# Patient Record
Sex: Male | Born: 1950
Health system: Southern US, Community
[De-identification: ages and names within clinical notes are randomized; demographics above are authoritative.]

## PROBLEM LIST (undated history)

## (undated) DIAGNOSIS — A499 Bacterial infection, unspecified: Secondary | ICD-10-CM

## (undated) DIAGNOSIS — N390 Urinary tract infection, site not specified: Secondary | ICD-10-CM

## (undated) DIAGNOSIS — R231 Pallor: Secondary | ICD-10-CM

## (undated) DIAGNOSIS — J309 Allergic rhinitis, unspecified: Secondary | ICD-10-CM

## (undated) DIAGNOSIS — E119 Type 2 diabetes mellitus without complications: Secondary | ICD-10-CM

## (undated) DIAGNOSIS — J449 Chronic obstructive pulmonary disease, unspecified: Secondary | ICD-10-CM

## (undated) DIAGNOSIS — Z87442 Personal history of urinary calculi: Secondary | ICD-10-CM

## (undated) DIAGNOSIS — I872 Venous insufficiency (chronic) (peripheral): Secondary | ICD-10-CM

## (undated) DIAGNOSIS — J9811 Atelectasis: Secondary | ICD-10-CM

## (undated) DIAGNOSIS — R0600 Dyspnea, unspecified: Secondary | ICD-10-CM

## (undated) DIAGNOSIS — N200 Calculus of kidney: Secondary | ICD-10-CM

## (undated) DIAGNOSIS — R748 Abnormal levels of other serum enzymes: Secondary | ICD-10-CM

## (undated) DIAGNOSIS — R0602 Shortness of breath: Secondary | ICD-10-CM

## (undated) DIAGNOSIS — D759 Disease of blood and blood-forming organs, unspecified: Secondary | ICD-10-CM

## (undated) DIAGNOSIS — R05 Cough: Secondary | ICD-10-CM

## (undated) DIAGNOSIS — R160 Hepatomegaly, not elsewhere classified: Secondary | ICD-10-CM

## (undated) DIAGNOSIS — I809 Phlebitis and thrombophlebitis of unspecified site: Secondary | ICD-10-CM

## (undated) DIAGNOSIS — R059 Cough, unspecified: Secondary | ICD-10-CM

## (undated) DIAGNOSIS — I10 Essential (primary) hypertension: Secondary | ICD-10-CM

## (undated) DIAGNOSIS — R768 Other specified abnormal immunological findings in serum: Secondary | ICD-10-CM

## (undated) DIAGNOSIS — E669 Obesity, unspecified: Secondary | ICD-10-CM

## (undated) DIAGNOSIS — G709 Myoneural disorder, unspecified: Secondary | ICD-10-CM

## (undated) DIAGNOSIS — M79606 Pain in leg, unspecified: Secondary | ICD-10-CM

## (undated) DIAGNOSIS — G4730 Sleep apnea, unspecified: Secondary | ICD-10-CM

## (undated) DIAGNOSIS — R350 Frequency of micturition: Secondary | ICD-10-CM

## (undated) DIAGNOSIS — R001 Bradycardia, unspecified: Secondary | ICD-10-CM

## (undated) DIAGNOSIS — R609 Edema, unspecified: Secondary | ICD-10-CM

## (undated) DIAGNOSIS — I878 Other specified disorders of veins: Secondary | ICD-10-CM

## (undated) DIAGNOSIS — J189 Pneumonia, unspecified organism: Secondary | ICD-10-CM

## (undated) DIAGNOSIS — M069 Rheumatoid arthritis, unspecified: Secondary | ICD-10-CM

## (undated) DIAGNOSIS — Z8489 Family history of other specified conditions: Secondary | ICD-10-CM

## (undated) HISTORY — DX: Allergic rhinitis, unspecified: J30.9

## (undated) HISTORY — DX: Abnormal levels of other serum enzymes: R74.8

## (undated) HISTORY — DX: Bradycardia, unspecified: R00.1

## (undated) HISTORY — DX: Venous insufficiency (chronic) (peripheral): I87.2

## (undated) HISTORY — DX: Shortness of breath: R06.02

## (undated) HISTORY — PX: KNEE SURGERY: SHX244

## (undated) HISTORY — DX: Edema, unspecified: R60.9

## (undated) HISTORY — DX: Essential (primary) hypertension: I10

## (undated) HISTORY — DX: Phlebitis and thrombophlebitis of unspecified site: I80.9

## (undated) HISTORY — PX: SPINAL CORD STIMULATOR IMPLANT: SHX2422

## (undated) HISTORY — DX: Rheumatoid arthritis, unspecified: M06.9

## (undated) HISTORY — DX: Obesity, unspecified: E66.9

## (undated) HISTORY — PX: UMBILICAL HERNIA REPAIR: SHX196

## (undated) HISTORY — DX: Hepatomegaly, not elsewhere classified: R16.0

## (undated) HISTORY — DX: Cough: R05

## (undated) HISTORY — DX: Pain in leg, unspecified: M79.606

## (undated) HISTORY — DX: Atelectasis: J98.11

## (undated) HISTORY — DX: Other specified disorders of veins: I87.8

## (undated) HISTORY — PX: OTHER SURGICAL HISTORY: SHX169

## (undated) HISTORY — DX: Cough, unspecified: R05.9

## (undated) HISTORY — DX: Pallor: R23.1

---
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
## 1986-03-07 HISTORY — PX: LAMINECTOMY: SHX219

## 2004-01-19 ENCOUNTER — Ambulatory Visit: Payer: Self-pay | Admitting: Endocrinology

## 2004-02-19 ENCOUNTER — Ambulatory Visit: Payer: Self-pay | Admitting: Internal Medicine

## 2004-03-03 ENCOUNTER — Ambulatory Visit: Payer: Self-pay

## 2004-04-12 ENCOUNTER — Ambulatory Visit: Payer: Self-pay | Admitting: Internal Medicine

## 2004-04-12 ENCOUNTER — Ambulatory Visit (HOSPITAL_COMMUNITY): Admission: RE | Admit: 2004-04-12 | Discharge: 2004-04-12 | Payer: Self-pay | Admitting: Internal Medicine

## 2004-04-19 ENCOUNTER — Ambulatory Visit: Payer: Self-pay | Admitting: Internal Medicine

## 2004-09-14 ENCOUNTER — Ambulatory Visit: Payer: Self-pay | Admitting: Endocrinology

## 2004-09-24 ENCOUNTER — Ambulatory Visit: Payer: Self-pay | Admitting: Endocrinology

## 2004-10-05 ENCOUNTER — Ambulatory Visit: Payer: Self-pay | Admitting: Endocrinology

## 2004-10-20 ENCOUNTER — Ambulatory Visit: Payer: Self-pay | Admitting: Endocrinology

## 2004-11-09 ENCOUNTER — Ambulatory Visit: Payer: Self-pay | Admitting: Endocrinology

## 2004-12-31 ENCOUNTER — Ambulatory Visit: Payer: Self-pay | Admitting: Endocrinology

## 2005-03-07 HISTORY — PX: LAMINECTOMY: SHX219

## 2007-03-08 HISTORY — PX: REPLACEMENT TOTAL KNEE: SUR1224

## 2010-06-17 ENCOUNTER — Encounter: Payer: Self-pay | Admitting: Critical Care Medicine

## 2010-06-17 ENCOUNTER — Ambulatory Visit (INDEPENDENT_AMBULATORY_CARE_PROVIDER_SITE_OTHER): Payer: BC Managed Care – PPO | Admitting: Critical Care Medicine

## 2010-06-17 DIAGNOSIS — I872 Venous insufficiency (chronic) (peripheral): Secondary | ICD-10-CM

## 2010-06-17 DIAGNOSIS — I809 Phlebitis and thrombophlebitis of unspecified site: Secondary | ICD-10-CM

## 2010-06-17 DIAGNOSIS — E669 Obesity, unspecified: Secondary | ICD-10-CM

## 2010-06-17 DIAGNOSIS — I10 Essential (primary) hypertension: Secondary | ICD-10-CM

## 2010-06-17 DIAGNOSIS — J301 Allergic rhinitis due to pollen: Secondary | ICD-10-CM

## 2010-06-17 DIAGNOSIS — R0602 Shortness of breath: Secondary | ICD-10-CM

## 2010-06-17 DIAGNOSIS — J309 Allergic rhinitis, unspecified: Secondary | ICD-10-CM

## 2010-06-17 DIAGNOSIS — M069 Rheumatoid arthritis, unspecified: Secondary | ICD-10-CM

## 2010-06-17 DIAGNOSIS — J449 Chronic obstructive pulmonary disease, unspecified: Secondary | ICD-10-CM

## 2010-06-17 MED ORDER — TIOTROPIUM BROMIDE MONOHYDRATE 18 MCG IN CAPS: 18.0000 ug | ORAL_CAPSULE | Freq: Every day | RESPIRATORY_TRACT | Status: DC

## 2010-06-17 MED ORDER — AZELASTINE HCL 0.15 % NA SOLN: 2.0000 | Freq: Every day | NASAL | Status: DC

## 2010-06-17 NOTE — Progress Notes (Signed)
Subjective:    Patient ID: Anthony Friedman, male    DOB: 06/18/1950, 60 y.o.   MRN: [id number]  Shortness of Breath This is a new problem. The current episode started more than 1 month ago (9- 12months). The problem occurs daily (exertional only, if takes cans to curb/trash has to sit for to recover, taking a shower,  does ok in the pool ). The problem has been unchanged. Associated symptoms include leg pain, rhinorrhea, sputum production and vomiting. Pertinent negatives include no abdominal pain, chest pain, claudication, ear pain, fever, headaches, hemoptysis, leg swelling, neck pain, orthopnea, PND, rash, sore throat, syncope or wheezing. The symptoms are aggravated by any activity. Associated symptoms comments: Chronic back pain and phlebitis so has chronic leg pain  Episodes of emesis,  Nose is running constantly, if sleeps gathers in the throat, then every AM, will cough up phlegm. In the effort to get up mucus will have emesis  No real heartburn. He has tried beta agonist inhalers for the symptoms. The treatment provided no relief. His past medical history is significant for allergies and pneumonia. There is no history of asthma, CAD, chronic lung disease, COPD, DVT, a heart failure or PE. (Hx of PNA, three times,  mid 1990s first time, )    60 yo WM referred for dyspnea Pt on chronic coumadin since 10/11 d/t venous stasis.  DVT studies neg.  No PE seen. Pt with symptoms as above Pt has had superficial phlebitis but no DVT on recent duplex doppler study.    Pt denies any significant sore throat, nasal congestion or excess secretions, fever, chills, sweats, unintended weight loss, pleurtic or exertional chest pain, orthopnea PND, or leg swelling Pt denies any increase in rescue therapy over baseline, denies waking up needing it or having any early am or nocturnal exacerbations of coughing/wheezing/or dyspnea. Pt also denies any obvious fluctuation in symptoms with  weather or environmental  change or other alleviating or aggravating factors   Past Medical History  Diagnosis Date  . Chronic venous insufficiency     superficial  . Superficial thrombophlebitis     subacute-on anticoagulation  . Leg pain   . Edema     lower extremities  . Venous stasis     changes  . SOBOE (shortness of breath on exertion)   . RA (rheumatoid arthritis)     Dx 2005  . Liver enlargement     d/t arava  . Elevated liver enzymes     d/t arava  . Atelectasis   . Chronic allergic rhinitis   . Cough   . Obesity   . Hypertension   . Pallor      Family History  Problem Relation Age of Onset  . Heart attack Father   . Esophageal cancer Mother   . Other Mother     Tachycardia  . Diabetes Brother      History   Social History  . Marital Status: Married    Spouse Name: N/A    Number of Children: 4  . Years of Education: N/A   Occupational History  . disabled      former Chief Financial Officer, office work    Social History Main Topics  . Smoking status: Former Smoker -- 1.0 packs/day for 40 years    Types: Cigarettes    Quit date: 09/05/2007  . Smokeless tobacco: Never Used  . Alcohol Use: Yes     occasional wine and liquor  . Drug Use: No  . Sexually  Active: Not on file   Other Topics Concern  . Not on file   Social History Narrative  . No narrative on file     Allergies  Allergen Reactions  . Sulfa Antibiotics     Blood in urine     Outpatient Prescriptions Prior to Visit  Medication Sig Dispense Refill  . ALPRAZolam (XANAX) 0.5 MG tablet Take 0.5 mg by mouth as needed. To bridge phobia       . Ascorbic Acid (VITAMIN C) 1000 MG tablet Take 1,000 mg by mouth daily.        . Calcium Carb-Cholecalciferol (CALCIUM 1000 + D) 1000-800 MG-UNIT TABS Take 800 mg by mouth daily.        . Multiple Vitamin (MULTIVITAMIN) tablet Take 1 tablet by mouth daily.        Marland Kitchen telmisartan (MICARDIS) 40 MG tablet Take 40 mg by mouth daily.        Marland Kitchen warfarin (COUMADIN) 2.5 MG tablet  daily. As directed      . aspirin 81 MG tablet Take 81 mg by mouth daily.        Marland Kitchen atenolol-chlorthalidone (TENORETIC) 100-25 MG per tablet Take 1 tablet by mouth daily.        Marland Kitchen lidocaine (LIDODERM) 5 % Place 1 patch onto the skin every 12 (twelve) hours. Remove & Discard patch within 12 hours or as directed by MD       . Omega-3 Fatty Acids (FISH OIL) 1000 MG CAPS Take 1 capsule by mouth daily.           Review of Systems  Constitutional: Positive for appetite change. Negative for fever, chills, diaphoresis, activity change, fatigue and unexpected weight change.  HENT: Positive for rhinorrhea and postnasal drip. Negative for hearing loss, ear pain, nosebleeds, congestion, sore throat, facial swelling, sneezing, mouth sores, trouble swallowing, neck pain, neck stiffness, dental problem, voice change, sinus pressure, tinnitus and ear discharge.   Eyes: Positive for itching. Negative for photophobia, discharge and visual disturbance.  Respiratory: Positive for cough, sputum production and shortness of breath. Negative for apnea, hemoptysis, choking, chest tightness, wheezing and stridor.   Cardiovascular: Negative for chest pain, palpitations, orthopnea, claudication, leg swelling, syncope and PND.  Gastrointestinal: Positive for nausea and vomiting. Negative for abdominal pain, constipation, blood in stool and abdominal distention.  Genitourinary: Positive for hematuria. Negative for dysuria, urgency, frequency, flank pain, decreased urine volume and difficulty urinating.  Musculoskeletal: Positive for back pain. Negative for myalgias, joint swelling, arthralgias and gait problem.  Skin: Negative for color change, pallor and rash.  Neurological: Negative for dizziness, tremors, seizures, syncope, speech difficulty, weakness, light-headedness, numbness and headaches.  Hematological: Negative for adenopathy. Does not bruise/bleed easily.  Psychiatric/Behavioral: Negative for confusion, sleep  disturbance and agitation. The patient is not nervous/anxious.        Objective:   Physical Exam Gen: Pleasant, obese WM, in no distress,  normal affect  ENT: No lesions,  mouth clear,  oropharynx clear, no postnasal drip  Neck: No JVD, no TMG, no carotid bruits  Lungs: No use of accessory muscles, no dullness to percussion, distant BS  Cardiovascular: RRR, heart sounds normal, no murmur or gallops, no peripheral edema, severe varicose veins bilat R>L  Abdomen: soft and NT, no HSM,  BS normal  Musculoskeletal: No deformities, no cyanosis or clubbing  Neuro: alert, non focal  Skin: Warm, no lesions or rashes      Nuclear lexi scan : normal 10/11 CT chest 10/11:  No pe,  Low lung volumes, no fibrosis, mild basilar atx, Cor calcifications  Assessment & Plan:   Obstructive chronic bronchitis without exacerbation 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 ALPRAZolam (XANAX) 0.5 MG tablet Take 0.5 mg by mouth as needed. To bridge phobia       . Ascorbic Acid (VITAMIN C) 1000 MG tablet Take 1,000 mg by mouth daily.        Marland Kitchen atenolol (TENORMIN) 50 MG tablet Take 50 mg by mouth daily.        . Calcium Carb-Cholecalciferol (CALCIUM 1000 + D) 1000-800 MG-UNIT TABS Take 800 mg by mouth daily.        . Multiple Vitamin (MULTIVITAMIN) tablet Take 1 tablet by mouth daily.        . Omega-3 Fatty Acids (FISH OIL) 1200 MG CAPS Take 1 capsule by mouth daily.        Marland Kitchen telmisartan  (MICARDIS) 40 MG tablet Take 40 mg by mouth daily.        Marland Kitchen warfarin (COUMADIN) 2.5 MG tablet daily. As directed      . Azelastine HCl (ASTEPRO) 0.15 % SOLN 2 puffs by Nasal route daily.  30 mL  6  . tiotropium (SPIRIVA HANDIHALER) 18 MCG inhalation capsule Place 1 capsule (18 mcg total) into inhaler and inhale daily.  30 capsule  6  . DISCONTD: aspirin 81 MG tablet Take 81 mg by mouth daily.        Marland Kitchen DISCONTD: atenolol-chlorthalidone (TENORETIC) 100-25 MG per tablet Take 1 tablet by mouth daily.        Marland Kitchen DISCONTD: lidocaine (LIDODERM) 5 % Place 1 patch onto the skin every 12 (twelve) hours. Remove & Discard patch within 12 hours or as directed by MD       . DISCONTD: Omega-3 Fatty Acids (FISH OIL) 1000 MG CAPS Take 1 capsule by mouth daily.

## 2010-06-17 NOTE — Patient Instructions (Signed)
Start Spiriva daily Start Astepro two sprays each nostril daily We will obtain a full pulmonary function study at out main [facility] office Return in 1 month [place]

## 2010-06-19 NOTE — Assessment & Plan Note (Addendum)
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

## 2010-06-19 NOTE — Assessment & Plan Note (Signed)
No evident acute phlebitis, neg CTA 10/11 for PE Plan  Cont coumadin RX

## 2010-06-21 ENCOUNTER — Ambulatory Visit (INDEPENDENT_AMBULATORY_CARE_PROVIDER_SITE_OTHER): Payer: BC Managed Care – PPO | Admitting: Critical Care Medicine

## 2010-06-21 DIAGNOSIS — J449 Chronic obstructive pulmonary disease, unspecified: Secondary | ICD-10-CM

## 2010-06-21 LAB — PULMONARY FUNCTION TEST

## 2010-06-21 NOTE — Progress Notes (Signed)
PFT done today. 

## 2010-06-24 ENCOUNTER — Telehealth: Payer: Self-pay | Admitting: Critical Care Medicine

## 2010-06-24 ENCOUNTER — Encounter: Payer: Self-pay | Admitting: Critical Care Medicine

## 2010-06-24 NOTE — Telephone Encounter (Signed)
Ok to pick up samples to try until he returns, go to main office for these

## 2010-06-24 NOTE — Telephone Encounter (Signed)
Called and spoke with pt.  Pt aware of PFT results and PW's recs.  Samples left at front desk for both Spiriva and Astepro.  Pt states he will come by our office here at the [place] location to pick these samples up.

## 2010-06-24 NOTE — Telephone Encounter (Signed)
Called and spoke with pt.  Pt recently saw PW on 06/17/2010 and started on Spiriva and Astepro. Pt states he went to pharmacy today to pick up both of these meds and states they were too expensive.  Spiriva = $75 and Astepro=$35.  Pt wanted to know  A) Can he get enough samples to last until his next ov with PW to see if these meds help his symptoms.   OR B) Can he switch to cheaper alternatives.   Please advise.  Thanks.

## 2010-06-24 NOTE — Telephone Encounter (Signed)
Also tell pt I reviewed his pulmonary function studies,  They reveal obstruction in the airway so i feel the spiriva will help with time .

## 2010-06-28 ENCOUNTER — Encounter: Payer: Self-pay | Admitting: Critical Care Medicine

## 2010-06-30 ENCOUNTER — Encounter: Payer: Self-pay | Admitting: Critical Care Medicine

## 2010-07-01 ENCOUNTER — Encounter: Payer: Self-pay | Admitting: Critical Care Medicine

## 2010-07-15 ENCOUNTER — Encounter: Payer: Self-pay | Admitting: Critical Care Medicine

## 2010-07-15 ENCOUNTER — Ambulatory Visit (INDEPENDENT_AMBULATORY_CARE_PROVIDER_SITE_OTHER): Payer: BC Managed Care – PPO | Admitting: Critical Care Medicine

## 2010-07-15 VITALS — BP 100/60 | HR 68 | Temp 97.9°F | Ht 69.0 in | Wt 254.0 lb

## 2010-07-15 DIAGNOSIS — J449 Chronic obstructive pulmonary disease, unspecified: Secondary | ICD-10-CM

## 2010-07-15 MED ORDER — MOMETASONE FURO-FORMOTEROL FUM 200-5 MCG/ACT IN AERO: 2.0000 | INHALATION_SPRAY | Freq: Two times a day (BID) | RESPIRATORY_TRACT | Status: DC

## 2010-07-15 MED ORDER — MOXIFLOXACIN HCL 400 MG PO TABS: 400.0000 mg | ORAL_TABLET | Freq: Every day | ORAL | Status: AC

## 2010-07-15 MED ORDER — PREDNISONE 10 MG PO TABS: ORAL_TABLET | ORAL | Status: DC

## 2010-07-15 NOTE — Progress Notes (Signed)
Subjective:    Patient ID: Anthony Friedman, male    DOB: 1950/10/05, 60 y.o.   MRN: [id number]  Shortness of Breath This is a new problem. The current episode started more than 1 month ago (9- 12months). The problem occurs daily (exertional only, if takes cans to curb/trash has to sit for to recover, taking a shower,  does ok in the pool ). The problem has been unchanged. Associated symptoms include leg pain, rhinorrhea, sputum production and vomiting. Pertinent negatives include no abdominal pain, chest pain, claudication, ear pain, fever, headaches, hemoptysis, leg swelling, neck pain, orthopnea, PND, rash, sore throat, syncope or wheezing. The symptoms are aggravated by any activity. Associated symptoms comments: Chronic back pain and phlebitis so has chronic leg pain  Episodes of emesis,  Nose is running constantly, if sleeps gathers in the throat, then every AM, will cough up phlegm. In the effort to get up mucus will have emesis  No real heartburn. He has tried beta agonist inhalers for the symptoms. The treatment provided no relief. His past medical history is significant for allergies and pneumonia. There is no history of asthma, CAD, chronic lung disease, COPD, DVT, a heart failure or PE. (Hx of PNA, three times,  mid 1990s first time, )    60 yo WM referred for dyspnea Pt on chronic coumadin since 10/11 d/t venous stasis.  DVT studies neg.  No PE seen. Pt with symptoms as above Pt has had superficial phlebitis but no DVT on recent duplex doppler study.    Pt denies any significant sore throat, nasal congestion or excess secretions, fever, chills, sweats, unintended weight loss, pleurtic or exertional chest pain, orthopnea PND, or leg swelling Pt denies any increase in rescue therapy over baseline, denies waking up needing it or having any early am or nocturnal exacerbations of coughing/wheezing/or dyspnea. Pt also denies any obvious fluctuation in symptoms with  weather or environmental  change or other alleviating or aggravating factors  07/16/2010 Pt notes less mucus.  Has had some cough.  Still wheezes.  Still dyspneic with any exertion Last three weeks nose is congested.  ? Viral illness?   Mucus is grey to clear.   ABX rx zpak.from PCP.  ? If helped.   Past Medical History  Diagnosis Date  . Chronic venous insufficiency     superficial  . Superficial thrombophlebitis     subacute-on anticoagulation  . Leg pain   . Edema     lower extremities  . Venous stasis     changes  . SOBOE (shortness of breath on exertion)   . RA (rheumatoid arthritis)     Dx 2005  . Liver enlargement     d/t arava  . Elevated liver enzymes     d/t arava  . Atelectasis   . Chronic allergic rhinitis   . Cough   . Obesity   . Hypertension   . Pallor      Family History  Problem Relation Age of Onset  . Heart attack Father   . Esophageal cancer Mother   . Other Mother     Tachycardia  . Diabetes Brother      History   Social History  . Marital Status: Married    Spouse Name: N/A    Number of Children: 4  . Years of Education: N/A   Occupational History  . disabled      former Chief Financial Officer, office work    Social History Main Topics  . Smoking  status: Former Smoker -- 1.0 packs/day for 40 years    Types: Cigarettes    Quit date: 09/05/2007  . Smokeless tobacco: Never Used  . Alcohol Use: Yes     occasional wine and liquor  . Drug Use: No  . Sexually Active: Not on file   Other Topics Concern  . Not on file   Social History Narrative  . No narrative on file     Allergies  Allergen Reactions  . Sulfa Antibiotics     Blood in urine     Outpatient Prescriptions Prior to Visit  Medication Sig Dispense Refill  . ALPRAZolam (XANAX) 0.5 MG tablet Take 0.5 mg by mouth as needed. To bridge phobia       . Ascorbic Acid (VITAMIN C) 1000 MG tablet Take 1,000 mg by mouth daily.        . Calcium Carb-Cholecalciferol (CALCIUM 1000 + D) 1000-800 MG-UNIT TABS Take  800 mg by mouth daily.        . Multiple Vitamin (MULTIVITAMIN) tablet Take 1 tablet by mouth daily.        . Omega-3 Fatty Acids (FISH OIL) 1200 MG CAPS Take 1 capsule by mouth daily.        Marland Kitchen telmisartan (MICARDIS) 40 MG tablet Take 40 mg by mouth daily.        . Azelastine HCl (ASTEPRO) 0.15 % SOLN 2 puffs by Nasal route daily.  30 mL  6  . tiotropium (SPIRIVA HANDIHALER) 18 MCG inhalation capsule Place 1 capsule (18 mcg total) into inhaler and inhale daily.  30 capsule  6  . warfarin (COUMADIN) 2.5 MG tablet daily. As directed      . atenolol (TENORMIN) 50 MG tablet Take 50 mg by mouth daily.           Review of Systems  Constitutional: Positive for appetite change. Negative for fever, chills, diaphoresis, activity change, fatigue and unexpected weight change.  HENT: Positive for rhinorrhea and postnasal drip. Negative for hearing loss, ear pain, nosebleeds, congestion, sore throat, facial swelling, sneezing, mouth sores, trouble swallowing, neck pain, neck stiffness, dental problem, voice change, sinus pressure, tinnitus and ear discharge.   Eyes: Positive for itching. Negative for photophobia, discharge and visual disturbance.  Respiratory: Positive for cough, sputum production and shortness of breath. Negative for apnea, hemoptysis, choking, chest tightness, wheezing and stridor.   Cardiovascular: Negative for chest pain, palpitations, orthopnea, claudication, leg swelling, syncope and PND.  Gastrointestinal: Positive for nausea and vomiting. Negative for abdominal pain, constipation, blood in stool and abdominal distention.  Genitourinary: Positive for hematuria. Negative for dysuria, urgency, frequency, flank pain, decreased urine volume and difficulty urinating.  Musculoskeletal: Positive for back pain. Negative for myalgias, joint swelling, arthralgias and gait problem.  Skin: Negative for color change, pallor and rash.  Neurological: Negative for dizziness, tremors, seizures,  syncope, speech difficulty, weakness, light-headedness, numbness and headaches.  Hematological: Negative for adenopathy. Does not bruise/bleed easily.  Psychiatric/Behavioral: Negative for confusion, sleep disturbance and agitation. The patient is not nervous/anxious.        Objective:   Physical Exam Gen: Pleasant, obese WM, in no distress,  normal affect  ENT: No lesions,  mouth clear,  oropharynx clear, no postnasal drip  Neck: No JVD, no TMG, no carotid bruits  Lungs: No use of accessory muscles, no dullness to percussion, distant BS, exp wheeze  Cardiovascular: RRR, heart sounds normal, no murmur or gallops, no peripheral edema, severe varicose veins bilat R>L  Abdomen: soft  and NT, no HSM,  BS normal  Musculoskeletal: No deformities, no cyanosis or clubbing  Neuro: alert, non focal  Skin: Warm, no lesions or rashes      Nuclear lexi scan : normal 10/11 CT chest 10/11:  No pe,  Low lung volumes, no fibrosis, mild basilar atx, Cor calcifications PFT Conversion 06/28/2010  FVC 2.86  FVC PREDICT 4.52  FVC  % Predicted 63  FEV1 1.94  FEV1 PREDICT 3.23  FEV % Predicted 60  FEV1/FVC 67.8  FEV1/FVC PRE 72  FeF 25-75 1.23  FeF 25-75 % Predicted 39  RV % EXPECT 52  DLCO (ml/mmHg sec) 19.3  DLCO % EXPEC 67  DLCO/VA 4.97  DLCO/VA%EXP 130   Assessment & Plan:   Obstructive chronic bronchitis without exacerbation 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 ALPRAZolam (XANAX) 0.5 MG  tablet Take 0.5 mg by mouth as needed. To bridge phobia       . Ascorbic Acid (VITAMIN C) 1000 MG tablet Take 1,000 mg by mouth daily.        Marland Kitchen aspirin 81 MG tablet Take 81 mg by mouth daily.        Marland Kitchen atenolol-chlorthalidone (TENORETIC) 100-25 MG per tablet Take 1 tablet by mouth Daily.      . Calcium Carb-Cholecalciferol (CALCIUM 1000 + D) 1000-800 MG-UNIT TABS Take 800 mg by mouth daily.        . Multiple Vitamin (MULTIVITAMIN) tablet Take 1 tablet by mouth daily.        . Omega-3 Fatty Acids (FISH OIL) 1200 MG CAPS Take 1 capsule by mouth daily.        Marland Kitchen telmisartan (MICARDIS) 40 MG tablet Take 40 mg by mouth daily.        Marland Kitchen DISCONTD: Azelastine HCl (ASTEPRO) 0.15 % SOLN 2 puffs by Nasal route daily.  30 mL  6  . DISCONTD: tiotropium (SPIRIVA HANDIHALER) 18 MCG inhalation capsule Place 1 capsule (18 mcg total) into inhaler and inhale daily.  30 capsule  6  . Mometasone Furo-Formoterol Fum (DULERA) 200-5 MCG/ACT AERO Inhale 2 puffs into the lungs 2 (two) times daily.  1 Inhaler  4  . moxifloxacin (AVELOX) 400 MG tablet Take 1 tablet (400 mg total) by mouth daily.  5 tablet  0  . predniSONE (DELTASONE) 10 MG tablet Take 4 for two days, three for two days, two for two days, then one for two days and stop   20 tablet  0  . warfarin (COUMADIN) 2.5 MG tablet daily. As directed      . DISCONTD: atenolol (TENORMIN) 50 MG tablet Take 50 mg by mouth daily.

## 2010-07-15 NOTE — Patient Instructions (Signed)
Stop astepro and spiriva Start Dulera two puff twice daily Start prednisone 10mg  Take 4 for two days, three for two days, two for two days, then one for two days and stop Start avelox one daily for 5 days, take samples Get your protime/inr checked while on avelox (after 2-4 days) to make sure your blood does not get too thin Return [place] 2 months

## 2010-07-15 NOTE — Assessment & Plan Note (Addendum)
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

## 2010-09-30 ENCOUNTER — Encounter: Payer: Self-pay | Admitting: Critical Care Medicine

## 2010-09-30 ENCOUNTER — Ambulatory Visit (INDEPENDENT_AMBULATORY_CARE_PROVIDER_SITE_OTHER): Payer: BC Managed Care – PPO | Admitting: Critical Care Medicine

## 2010-09-30 DIAGNOSIS — F1010 Alcohol abuse, uncomplicated: Secondary | ICD-10-CM

## 2010-09-30 DIAGNOSIS — R945 Abnormal results of liver function studies: Secondary | ICD-10-CM

## 2010-09-30 DIAGNOSIS — M069 Rheumatoid arthritis, unspecified: Secondary | ICD-10-CM

## 2010-09-30 DIAGNOSIS — J449 Chronic obstructive pulmonary disease, unspecified: Secondary | ICD-10-CM

## 2010-09-30 NOTE — Progress Notes (Signed)
Subjective:    Patient ID: Anthony Friedman, male    DOB: 12/13/1950, 60 y.o.   MRN: [id number]  Shortness of Breath This is a new problem. The current episode started more than 1 month ago (9- 12months). The problem occurs daily (exertional only, if takes cans to curb/trash has to sit for to recover, taking a shower,  does ok in the pool ). The problem has been unchanged. Associated symptoms include leg pain. Pertinent negatives include no abdominal pain, chest pain, claudication, ear pain, fever, headaches, hemoptysis, leg swelling, neck pain, orthopnea, PND, rash, sore throat, sputum production, syncope, vomiting or wheezing. The symptoms are aggravated by any activity. Associated symptoms comments: Chronic back pain and phlebitis so has chronic leg pain  Episodes of emesis,  Nose is running constantly, if sleeps gathers in the throat, then every AM, will cough up phlegm. In the effort to get up mucus will have emesis  No real heartburn. He has tried beta agonist inhalers for the symptoms. The treatment provided no relief. His past medical history is significant for allergies and pneumonia. There is no history of asthma, CAD, chronic lung disease, COPD, DVT, a heart failure or PE. (Hx of PNA, three times,  mid 1990s first time, )    60 yo WM referred for dyspnea Pt on chronic coumadin since 10/11 d/t venous stasis.  DVT studies neg.  No PE seen. Pt with symptoms as above Pt has had superficial phlebitis but no DVT on recent duplex doppler study.    Pt denies any significant sore throat, nasal congestion or excess secretions, fever, chills, sweats, unintended weight loss, pleurtic or exertional chest pain, orthopnea PND, or leg swelling Pt denies any increase in rescue therapy over baseline, denies waking up needing it or having any early am or nocturnal exacerbations of coughing/wheezing/or dyspnea. Pt also denies any obvious fluctuation in symptoms with  weather or environmental change or other  alleviating or aggravating factors  07/15/10 Pt notes less mucus.  Has had some cough.  Still wheezes.  Still dyspneic with any exertion Last three weeks nose is congested.  ? Viral illness?   Mucus is grey to clear.   ABX rx zpak.from PCP.  ? If helped.    7/26 At last ov we rec: Avelox x5days, prednisone x 8days, Dulera 200 and stop Spiriva/astepro Not much improvement.  At last ov the pred/abx helped the mucus.  Dyspnea remains unchanged. Has a script for dulera, was in ETOH rehab advair was switched for the dulera. Now no real cough.  No chest pains.  Had LFTs.  Avelino Leeds did last labs. Past Medical History  Diagnosis Date  . Chronic venous insufficiency     superficial  . Superficial thrombophlebitis     subacute-on anticoagulation  . Leg pain   . Edema     lower extremities  . Venous stasis     changes  . SOBOE (shortness of breath on exertion)   . RA (rheumatoid arthritis)     Dx 2005  . Liver enlargement     d/t arava  . Elevated liver enzymes     d/t arava  . Atelectasis   . Chronic allergic rhinitis   . Cough   . Obesity   . Hypertension   . Pallor      Family History  Problem Relation Age of Onset  . Heart attack Father   . Esophageal cancer Mother   . Other Mother     Tachycardia  .  Diabetes Brother      History   Social History  . Marital Status: Married    Spouse Name: N/A    Number of Children: 4  . Years of Education: N/A   Occupational History  . disabled      former Chief Financial Officer, office work    Social History Main Topics  . Smoking status: Former Smoker -- 1.0 packs/day for 40 years    Types: Cigarettes    Quit date: 09/05/2007  . Smokeless tobacco: Never Used  . Alcohol Use: Yes     occasional wine and liquor  . Drug Use: No  . Sexually Active: Not on file   Other Topics Concern  . Not on file   Social History Narrative  . No narrative on file     Allergies  Allergen Reactions  . Sulfa Antibiotics     Blood in  urine     Outpatient Prescriptions Prior to Visit  Medication Sig Dispense Refill  . Ascorbic Acid (VITAMIN C) 1000 MG tablet Take 1,000 mg by mouth daily.        Marland Kitchen aspirin 81 MG tablet Take 81 mg by mouth daily.        Marland Kitchen atenolol-chlorthalidone (TENORETIC) 100-25 MG per tablet Take 1 tablet by mouth Daily.      . Calcium Carb-Cholecalciferol (CALCIUM 1000 + D) 1000-800 MG-UNIT TABS Take 800 mg by mouth daily.        . Mometasone Furo-Formoterol Fum (DULERA) 200-5 MCG/ACT AERO Inhale 2 puffs into the lungs 2 (two) times daily.  1 Inhaler  4  . Multiple Vitamin (MULTIVITAMIN) tablet Take 1 tablet by mouth daily.        . Omega-3 Fatty Acids (FISH OIL) 1200 MG CAPS Take 1 capsule by mouth daily.        Marland Kitchen telmisartan (MICARDIS) 40 MG tablet Take 40 mg by mouth daily.        Marland Kitchen ALPRAZolam (XANAX) 0.5 MG tablet Take 0.5 mg by mouth as needed. To bridge phobia       . predniSONE (DELTASONE) 10 MG tablet Take 4 for two days, three for two days, two for two days, then one for two days and stop   20 tablet  0  . warfarin (COUMADIN) 2.5 MG tablet daily. As directed         Review of Systems  Constitutional: Negative for fever, chills, diaphoresis, activity change, appetite change, fatigue and unexpected weight change.  HENT: Negative for hearing loss, ear pain, nosebleeds, congestion, sore throat, facial swelling, sneezing, mouth sores, trouble swallowing, neck pain, neck stiffness, dental problem, voice change, postnasal drip, sinus pressure, tinnitus and ear discharge.   Eyes: Negative for photophobia, discharge, itching and visual disturbance.  Respiratory: Positive for shortness of breath. Negative for apnea, cough, hemoptysis, sputum production, choking, chest tightness, wheezing and stridor.   Cardiovascular: Negative for chest pain, palpitations, orthopnea, claudication, leg swelling, syncope and PND.  Gastrointestinal: Negative for nausea, vomiting, abdominal pain, constipation, blood in stool  and abdominal distention.  Genitourinary: Negative for dysuria, urgency, frequency, hematuria, flank pain, decreased urine volume and difficulty urinating.  Musculoskeletal: Positive for back pain. Negative for myalgias, joint swelling, arthralgias and gait problem.  Skin: Negative for color change, pallor and rash.  Neurological: Negative for dizziness, tremors, seizures, syncope, speech difficulty, weakness, light-headedness, numbness and headaches.  Hematological: Negative for adenopathy. Does not bruise/bleed easily.  Psychiatric/Behavioral: Negative for confusion, sleep disturbance and agitation. The patient is not nervous/anxious.  PFT Conversion 06/28/2010  FVC 2.86  FVC PREDICT 4.52  FVC  % Predicted 63  FEV1 1.94  FEV1 PREDICT 3.23  FEV % Predicted 60  FEV1/FVC 67.8  FEV1/FVC PRE 72  FeF 25-75 1.23  FeF 25-75 % Predicted 39  RV % EXPECT 52  DLCO (ml/mmHg sec) 19.3  DLCO % EXPEC 67  DLCO/VA 4.97  DLCO/VA%EXP 130       Objective:   Physical Exam  Gen: Pleasant, obese WM, in no distress,  normal affect  ENT: No lesions,  mouth clear,  oropharynx clear, no postnasal drip  Neck: No JVD, no TMG, no carotid bruits  Lungs: No use of accessory muscles, no dullness to percussion, distant BS,   Cardiovascular: RRR, heart sounds normal, no murmur or gallops, no peripheral edema, severe varicose veins bilat R>L  Abdomen: soft and NT, no HSM,  BS normal  Musculoskeletal: No deformities, no cyanosis or clubbing  Neuro: alert, non focal  Skin: Warm, no lesions or rashes      Nuclear lexi scan : normal 10/11 CT chest 10/11:  No pe,  Low lung volumes, no fibrosis, mild basilar atx, Cor calcifications PFT Conversion 06/28/2010  FVC 2.86  FVC PREDICT 4.52  FVC  % Predicted 63  FEV1 1.94  FEV1 PREDICT 3.23  FEV % Predicted 60  FEV1/FVC 67.8  FEV1/FVC PRE 72  FeF 25-75 1.23  FeF 25-75 % Predicted 39  RV % EXPECT 52  DLCO (ml/mmHg sec) 19.3  DLCO % EXPEC 67    DLCO/VA 4.97  DLCO/VA%EXP 130   Assessment & Plan:   Obstructive chronic bronchitis without exacerbation 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 Ascorbic Acid (VITAMIN C) 1000 MG tablet Take 1,000 mg by mouth daily.        Marland Kitchen aspirin 81 MG tablet Take 81 mg by mouth daily.        Marland Kitchen atenolol-chlorthalidone (TENORETIC) 100-25 MG per tablet Take 1 tablet by mouth Daily.      . Calcium Carb-Cholecalciferol (CALCIUM 1000 + D) 1000-800 MG-UNIT TABS Take 800 mg by mouth daily.        Marland Kitchen leflunomide (ARAVA) 20 MG tablet Take 20 mg by mouth daily. 1 daily       . mirtazapine (REMERON) 30 MG tablet Taking 30 mg 1 daily      . Mometasone Furo-Formoterol Fum (DULERA) 200-5 MCG/ACT AERO Inhale 2 puffs into the lungs 2 (two) times daily.  1 Inhaler  4  . Multiple Vitamin (MULTIVITAMIN) tablet Take 1 tablet by mouth daily.        . naltrexone (DEPADE) 50 MG tablet 1 daily      . Omega-3 Fatty Acids (FISH OIL) 1200 MG CAPS Take 1 capsule by mouth daily.        . Tamsulosin HCl (FLOMAX) 0.4 MG CAPS Take 0.4 mg by mouth daily.       Marland Kitchen telmisartan (MICARDIS) 40 MG tablet Take 40 mg by mouth daily.        Marland Kitchen DISCONTD: ALPRAZolam (XANAX) 0.5 MG tablet Take 0.5 mg by mouth as needed. To bridge phobia       . DISCONTD: predniSONE (DELTASONE) 10 MG tablet  Take 4 for two days, three for two days, two for two days, then one for two days and stop   20 tablet  0  . DISCONTD: warfarin (COUMADIN) 2.5 MG tablet daily. As directed

## 2010-09-30 NOTE — Patient Instructions (Signed)
No change in medications. Return in         4 months 

## 2010-10-02 NOTE — Assessment & Plan Note (Addendum)
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

## 2010-10-12 ENCOUNTER — Telehealth: Payer: Self-pay | Admitting: Critical Care Medicine

## 2010-10-12 NOTE — Telephone Encounter (Signed)
Forwarded to Dr. Wright for review. °

## 2011-05-11 ENCOUNTER — Telehealth: Payer: Self-pay | Admitting: Critical Care Medicine

## 2011-05-11 MED ORDER — FLUTICASONE-SALMETEROL 250-50 MCG/DOSE IN AEPB: 1.0000 | INHALATION_SPRAY | Freq: Two times a day (BID) | RESPIRATORY_TRACT | Status: DC

## 2011-05-11 NOTE — Telephone Encounter (Signed)
See if advair 250 or symbicort 160 is less

## 2011-05-11 NOTE — Telephone Encounter (Signed)
I spoke with the pt and advised rx for advair sent, but if this is too expensive then to call and we can try symbicort. Anthony Friedman, CMA

## 2011-05-11 NOTE — Telephone Encounter (Signed)
I spoke with the pt and he states he recently changed to medicare and Elwin Sleight is now costing him $80 a month so he asking for an alternative. I advised Dr. Delford Field is out of office this week, but I can provide him with a sample and send a message to Dr. Delford Field to address on his return. Pt is ok with this, sample left at front. Please advise on alternative. Carron Curie, CMA'

## 2011-10-13 ENCOUNTER — Ambulatory Visit (INDEPENDENT_AMBULATORY_CARE_PROVIDER_SITE_OTHER): Payer: Medicare Other | Admitting: Critical Care Medicine

## 2011-10-13 ENCOUNTER — Encounter: Payer: Self-pay | Admitting: Critical Care Medicine

## 2011-10-13 VITALS — BP 110/68 | HR 85 | Temp 98.1°F | Ht 69.0 in | Wt 318.0 lb

## 2011-10-13 DIAGNOSIS — J449 Chronic obstructive pulmonary disease, unspecified: Secondary | ICD-10-CM

## 2011-10-13 DIAGNOSIS — F1010 Alcohol abuse, uncomplicated: Secondary | ICD-10-CM

## 2011-10-13 DIAGNOSIS — J4489 Other specified chronic obstructive pulmonary disease: Secondary | ICD-10-CM

## 2011-10-13 MED ORDER — MOMETASONE FURO-FORMOTEROL FUM 200-5 MCG/ACT IN AERO: 2.0000 | INHALATION_SPRAY | Freq: Two times a day (BID) | RESPIRATORY_TRACT | Status: DC

## 2011-10-13 MED ORDER — FLUTICASONE-SALMETEROL 250-50 MCG/DOSE IN AEPB: INHALATION_SPRAY | RESPIRATORY_TRACT | Status: DC

## 2011-10-13 NOTE — Patient Instructions (Addendum)
Dulera two puff twice daily OR Advair one puff twice daily Return 6 months

## 2011-10-13 NOTE — Progress Notes (Signed)
Subjective:    Patient ID: Anthony Friedman, male    DOB: [birth year], 61 y.o.   MRN: [id number]  HPI  61 yo WM referred for dyspnea Pt on chronic coumadin since 10/11 d/t venous stasis.  DVT studies neg.  No PE seen. Pt with symptoms as above Pt has had superficial phlebitis but no DVT on recent duplex doppler study.    Pt denies any significant sore throat, nasal congestion or excess secretions, fever, chills, sweats, unintended weight loss, pleurtic or exertional chest pain, orthopnea PND, or leg swelling Pt denies any increase in rescue therapy over baseline, denies waking up needing it or having any early am or nocturnal exacerbations of coughing/wheezing/or dyspnea. Pt also denies any obvious fluctuation in symptoms with  weather or environmental change or other alleviating or aggravating factors  07/15/10 Pt notes less mucus.  Has had some cough.  Still wheezes.  Still dyspneic with any exertion Last three weeks nose is congested.  ? Viral illness?   Mucus is grey to clear.   ABX rx zpak.from PCP.  ? If helped.    7/26 At last ov we rec: Avelox x5days, prednisone x 8days, Dulera 200 and stop Spiriva/astepro Not much improvement.  At last ov the pred/abx helped the mucus.  Dyspnea remains unchanged. Has a script for dulera, was in ETOH rehab advair was switched for the dulera. Now no real cough.  No chest pains.  Had LFTs.  Avelino Leeds did last labs.  10/13/2011 Pt last seen 09/2010.  Pt was on Dulera and worked well but now too expensive.  Now on advair. Now not much cough, pt did have a URI and some mucus with that issue.  Dyspnea is slightly worse since back on Advair.  No qhs dypsnea.  No chest pain.    Past Medical History  Diagnosis Date  . Chronic venous insufficiency     superficial  . Superficial thrombophlebitis     subacute-on anticoagulation  . Leg pain   . Edema     lower extremities  . Venous stasis     changes  . SOBOE (shortness of breath on exertion)   . RA  (rheumatoid arthritis)     Dx 2005  . Liver enlargement     d/t arava  . Elevated liver enzymes     d/t arava  . Atelectasis   . Chronic allergic rhinitis   . Cough   . Obesity   . Hypertension   . Pallor      Family History  Problem Relation Age of Onset  . Heart attack Father   . Esophageal cancer Mother   . Other Mother     Tachycardia  . Diabetes Brother      History   Social History  . Marital Status: Married    Spouse Name: N/A    Number of Children: 4  . Years of Education: N/A   Occupational History  . disabled      former Chief Financial Officer, office work    Social History Main Topics  . Smoking status: Former Smoker -- 1.0 packs/day for 40 years    Types: Cigarettes    Quit date: 09/05/2007  . Smokeless tobacco: Never Used  . Alcohol Use: Yes     occasional wine and liquor  . Drug Use: No  . Sexually Active: Not on file   Other Topics Concern  . Not on file   Social History Narrative  . No narrative on file  Allergies  Allergen Reactions  . Sulfa Antibiotics     Blood in urine     Outpatient Prescriptions Prior to Visit  Medication Sig Dispense Refill  . Ascorbic Acid (VITAMIN C) 1000 MG tablet Take 1,000 mg by mouth daily.        Marland Kitchen aspirin 81 MG tablet Take 81 mg by mouth daily.        Marland Kitchen atenolol-chlorthalidone (TENORETIC) 100-25 MG per tablet Take 1 tablet by mouth Daily.      . Calcium Carb-Cholecalciferol (CALCIUM 1000 + D) 1000-800 MG-UNIT TABS Take 800 mg by mouth daily.        Marland Kitchen leflunomide (ARAVA) 20 MG tablet Take 20 mg by mouth daily. 1 daily       . Multiple Vitamin (MULTIVITAMIN) tablet Take 1 tablet by mouth daily.        . Omega-3 Fatty Acids (FISH OIL) 1200 MG CAPS Take 1 capsule by mouth daily.        . Tamsulosin HCl (FLOMAX) 0.4 MG CAPS Take 0.4 mg by mouth daily.       Marland Kitchen telmisartan (MICARDIS) 40 MG tablet Take 40 mg by mouth daily.        . Fluticasone-Salmeterol (ADVAIR DISKUS) 250-50 MCG/DOSE AEPB Inhale 1 puff into  the lungs 2 (two) times daily.  60 each  3  . mirtazapine (REMERON) 30 MG tablet Taking 30 mg 1 daily      . Mometasone Furo-Formoterol Fum (DULERA) 200-5 MCG/ACT AERO Inhale 2 puffs into the lungs 2 (two) times daily.  1 Inhaler  4  . naltrexone (DEPADE) 50 MG tablet 1 daily         Review of Systems  Constitutional: Negative for chills, diaphoresis, activity change, appetite change, fatigue and unexpected weight change.  HENT: Negative for hearing loss, nosebleeds, congestion, facial swelling, sneezing, mouth sores, trouble swallowing, neck stiffness, dental problem, voice change, postnasal drip, sinus pressure, tinnitus and ear discharge.   Eyes: Negative for photophobia, discharge, itching and visual disturbance.  Respiratory: Negative for apnea, cough, choking, chest tightness and stridor.   Cardiovascular: Negative for palpitations.  Gastrointestinal: Negative for nausea, constipation, blood in stool and abdominal distention.  Genitourinary: Negative for dysuria, urgency, frequency, hematuria, flank pain, decreased urine volume and difficulty urinating.  Musculoskeletal: Positive for back pain. Negative for myalgias, joint swelling, arthralgias and gait problem.  Skin: Negative for color change and pallor.  Neurological: Negative for dizziness, tremors, seizures, syncope, speech difficulty, weakness, light-headedness and numbness.  Hematological: Negative for adenopathy. Does not bruise/bleed easily.  Psychiatric/Behavioral: Negative for confusion, disturbed wake/sleep cycle and agitation. The patient is not nervous/anxious.         Objective:   Physical Exam BP 110/68  Pulse 85  Temp 98.1 F (36.7 C) (Oral)  Ht 5\' 9"  (1.753 m)  Wt 318 lb (144.244 kg)  BMI 46.96 kg/m2  SpO2 92%  Gen: Pleasant, obese WM, in no distress,  normal affect  ENT: No lesions,  mouth clear,  oropharynx clear, no postnasal drip  Neck: No JVD, no TMG, no carotid bruits  Lungs: No use of accessory  muscles, no dullness to percussion, distant BS,   Cardiovascular: RRR, heart sounds normal, no murmur or gallops, no peripheral edema, severe varicose veins bilat R>L  Abdomen: soft and NT, no HSM,  BS normal  Musculoskeletal: No deformities, no cyanosis or clubbing  Neuro: alert, non focal  Skin: Warm, no lesions or rashes        Assessment &  Plan:   Obstructive chronic bronchitis without exacerbation Asthmatic bronchitis with chronic airflow obstruction and positive response to [facility] twice daily The patient will substitute with Advair if he is unable obtain [facility] samples    Updated Medication List Outpatient Encounter Prescriptions as of 10/13/2011  Medication Sig Dispense Refill  . Ascorbic Acid (VITAMIN C) 1000 MG tablet Take 1,000 mg by mouth daily.        Marland Kitchen aspirin 81 MG tablet Take 81 mg by mouth daily.        Marland Kitchen atenolol-chlorthalidone (TENORETIC) 100-25 MG per tablet Take 1 tablet by mouth Daily.      . Calcium Carb-Cholecalciferol (CALCIUM 1000 + D) 1000-800 MG-UNIT TABS Take 800 mg by mouth daily.        . Fluticasone-Salmeterol (ADVAIR DISKUS) 250-50 MCG/DOSE AEPB Use when no Dulera available  60 each  11  . HYDROcodone-acetaminophen (LORTAB) 10-500 MG per tablet Take 1 tablet by mouth Three times a day.      . leflunomide (ARAVA) 20 MG tablet Take 20 mg by mouth daily. 1 daily       . Multiple Vitamin (MULTIVITAMIN) tablet Take 1 tablet by mouth daily.        . Omega-3 Fatty Acids (FISH OIL) 1200 MG CAPS Take 1 capsule by mouth daily.        . Tamsulosin HCl (FLOMAX) 0.4 MG CAPS Take 0.4 mg by mouth daily.       Marland Kitchen telmisartan (MICARDIS) 40 MG tablet Take 40 mg by mouth daily.        Marland Kitchen DISCONTD: Fluticasone-Salmeterol (ADVAIR DISKUS) 250-50 MCG/DOSE AEPB Inhale 1 puff into the lungs 2 (two) times daily.  60 each  3  . Mometasone Furo-Formoterol Fum (DULERA) 200-5 MCG/ACT AERO Inhale 2 puffs into the lungs 2 (two) times daily. Use Advair when no  samples of dulera available  1 Inhaler  0  . DISCONTD: mirtazapine (REMERON) 30 MG tablet Taking 30 mg 1 daily      . DISCONTD: Mometasone Furo-Formoterol Fum (DULERA) 200-5 MCG/ACT AERO Inhale 2 puffs into the lungs 2 (two) times daily.  1 Inhaler  4  . DISCONTD: Mometasone Furo-Formoterol Fum (DULERA) 200-5 MCG/ACT AERO Inhale 2 puffs into the lungs 2 (two) times daily.  1 Inhaler  0  . DISCONTD: naltrexone (DEPADE) 50 MG tablet 1 daily

## 2011-10-14 NOTE — Assessment & Plan Note (Signed)
Asthmatic bronchitis with chronic airflow obstruction and positive response to [facility] twice daily The patient will substitute with Advair if he is unable obtain Dulera samples

## 2011-10-25 ENCOUNTER — Telehealth: Payer: Self-pay | Admitting: Critical Care Medicine

## 2011-10-25 NOTE — Telephone Encounter (Signed)
Called, spoke with pt.  Requesting samples of dulera 200/5.  2 samples left at front at [facility] office -- pt aware.  Dulera 200/5 Lot # [id number], Exp Date Jan 2015 -- x both samples.

## 2011-12-05 ENCOUNTER — Telehealth: Payer: Self-pay | Admitting: Critical Care Medicine

## 2011-12-05 MED ORDER — MOMETASONE FURO-FORMOTEROL FUM 200-5 MCG/ACT IN AERO: 2.0000 | INHALATION_SPRAY | Freq: Two times a day (BID) | RESPIRATORY_TRACT | Status: DC

## 2011-12-05 NOTE — Addendum Note (Signed)
Addended by: Darrell Jewel on: 12/05/2011 02:30 PM   Modules accepted: Orders

## 2011-12-05 NOTE — Telephone Encounter (Signed)
Pt aware and one sample left at front.Anthony Friedman, CMA

## 2012-01-10 ENCOUNTER — Telehealth: Payer: Self-pay | Admitting: Critical Care Medicine

## 2012-01-10 MED ORDER — MOMETASONE FURO-FORMOTEROL FUM 200-5 MCG/ACT IN AERO: 2.0000 | INHALATION_SPRAY | Freq: Two times a day (BID) | RESPIRATORY_TRACT | Status: DC

## 2012-01-10 NOTE — Telephone Encounter (Signed)
lmomtcb x1 for pt-- I have left sample upfront for pick up.

## 2012-01-10 NOTE — Telephone Encounter (Signed)
Patient requesting Dulera 200 samples. In [place] today for a funeral, available to reach until 1pm. Patient states okay to leave detailed msg on machine if he does not answer.   Dr Delford Field please advise if okay to give sample. Thanks.

## 2012-01-10 NOTE — Telephone Encounter (Signed)
Ok to do this 

## 2012-02-13 ENCOUNTER — Telehealth: Payer: Self-pay | Admitting: Critical Care Medicine

## 2012-02-13 MED ORDER — MOMETASONE FURO-FORMOTEROL FUM 200-5 MCG/ACT IN AERO: 2.0000 | INHALATION_SPRAY | Freq: Two times a day (BID) | RESPIRATORY_TRACT | Status: DC

## 2012-02-13 NOTE — Telephone Encounter (Signed)
Patient requesting sample of Dulera 200.  Samples left upfront for pickup, patient awawre and nothing further needed at this time.

## 2012-03-19 ENCOUNTER — Telehealth: Payer: Self-pay | Admitting: Critical Care Medicine

## 2012-03-19 MED ORDER — MOMETASONE FURO-FORMOTEROL FUM 200-5 MCG/ACT IN AERO: 2.0000 | INHALATION_SPRAY | Freq: Two times a day (BID) | RESPIRATORY_TRACT | Status: DC

## 2012-03-19 NOTE — Telephone Encounter (Signed)
Pt aware sample is up front.Anthony Friedman

## 2012-03-19 NOTE — Telephone Encounter (Signed)
lmomtcb x1 for pt. 1 sample has been left upfront for p/u

## 2012-04-09 ENCOUNTER — Telehealth: Payer: Self-pay | Admitting: Critical Care Medicine

## 2012-04-09 MED ORDER — MOMETASONE FURO-FORMOTEROL FUM 200-5 MCG/ACT IN AERO: 2.0000 | INHALATION_SPRAY | Freq: Two times a day (BID) | RESPIRATORY_TRACT | Status: DC

## 2012-04-09 NOTE — Telephone Encounter (Signed)
Called pt x 3 to make next appt per recall.  Left 3 messages and pt never called back.  Mailed recall letter 04/09/12. Emily E McAlister  °

## 2012-04-09 NOTE — Telephone Encounter (Signed)
Pt is aware that we have samples ready to be picked up, he states that he will pick them up on Thursday.

## 2012-04-26 ENCOUNTER — Ambulatory Visit (INDEPENDENT_AMBULATORY_CARE_PROVIDER_SITE_OTHER): Payer: Medicare Other | Admitting: Critical Care Medicine

## 2012-04-26 ENCOUNTER — Encounter: Payer: Self-pay | Admitting: Critical Care Medicine

## 2012-04-26 VITALS — BP 108/62 | HR 70 | Temp 97.9°F | Ht 69.0 in | Wt 310.0 lb

## 2012-04-26 DIAGNOSIS — J449 Chronic obstructive pulmonary disease, unspecified: Secondary | ICD-10-CM

## 2012-04-26 NOTE — Patient Instructions (Signed)
Rx dulera samples rov 6 months

## 2012-04-26 NOTE — Progress Notes (Signed)
Subjective:    Patient ID: Anthony Friedman, male    DOB: 1950-04-02, 62 y.o.   MRN: [id number]  HPI   62 yo WM gold stage C. COPD   04/26/2012 Since last ov has been doing well. No change in dyspnea, the [facility] works well. No real cough. Min mucus , no real wheeze. Recent L TKR, doing ok with this. No oxygen needed. No ETOH use. Pt denies any significant sore throat, nasal congestion or excess secretions, fever, chills, sweats, unintended weight loss, pleurtic or exertional chest pain, orthopnea PND, or leg swelling Pt denies any increase in rescue therapy over baseline, denies waking up needing it or having any early am or nocturnal exacerbations of coughing/wheezing/or dyspnea. Pt also denies any obvious fluctuation in symptoms with  weather or environmental change or other alleviating or aggravating factors    Past Medical History  Diagnosis Date  . Chronic venous insufficiency     superficial  . Superficial thrombophlebitis     subacute-on anticoagulation  . Leg pain   . Edema     lower extremities  . Venous stasis     changes  . SOBOE (shortness of breath on exertion)   . RA (rheumatoid arthritis)     Dx 2005  . Liver enlargement     d/t arava  . Elevated liver enzymes     d/t arava  . Atelectasis   . Chronic allergic rhinitis   . Cough   . Obesity   . Hypertension   . Pallor      Family History  Problem Relation Age of Onset  . Heart attack Father   . Esophageal cancer Mother   . Other Mother     Tachycardia  . Diabetes Brother      History   Social History  . Marital Status: Married    Spouse Name: N/A    Number of Children: 4  . Years of Education: N/A   Occupational History  . disabled      former Chief Financial Officer, office work    Social History Main Topics  . Smoking status: Former Smoker -- 1.00 packs/day for 40 years    Types: Cigarettes    Quit date: 09/05/2007  . Smokeless tobacco: Never Used  . Alcohol Use: Yes     Comment: occasional  wine and liquor  . Drug Use: No  . Sexually Active: Not on file   Other Topics Concern  . Not on file   Social History Narrative  . No narrative on file     Allergies  Allergen Reactions  . Sulfa Antibiotics     Blood in urine     Outpatient Prescriptions Prior to Visit  Medication Sig Dispense Refill  . Ascorbic Acid (VITAMIN C) 1000 MG tablet Take 1,000 mg by mouth daily.        Marland Kitchen aspirin 81 MG tablet Take 81 mg by mouth daily.        Marland Kitchen atenolol-chlorthalidone (TENORETIC) 100-25 MG per tablet Take 1 tablet by mouth Daily.      . Calcium Carb-Cholecalciferol (CALCIUM 1000 + D) 1000-800 MG-UNIT TABS Take 800 mg by mouth daily.        Marland Kitchen leflunomide (ARAVA) 20 MG tablet Take 20 mg by mouth daily. 1 daily       . mometasone-formoterol (DULERA) 200-5 MCG/ACT AERO Inhale 2 puffs into the lungs 2 (two) times daily. Use Advair when no samples of dulera available  2 Inhaler  0  . Multiple  Vitamin (MULTIVITAMIN) tablet Take 1 tablet by mouth daily.        . Omega-3 Fatty Acids (FISH OIL) 1200 MG CAPS Take 1 capsule by mouth daily.        . Tamsulosin HCl (FLOMAX) 0.4 MG CAPS Take 0.4 mg by mouth daily.       Marland Kitchen telmisartan (MICARDIS) 40 MG tablet Take 40 mg by mouth daily.        . Fluticasone-Salmeterol (ADVAIR DISKUS) 250-50 MCG/DOSE AEPB Use when no Dulera available  60 each  11  . HYDROcodone-acetaminophen (LORTAB) 10-500 MG per tablet Take 1 tablet by mouth Three times a day.       No facility-administered medications prior to visit.     Review of Systems  Constitutional: Negative for chills, diaphoresis, activity change, appetite change, fatigue and unexpected weight change.  HENT: Negative for hearing loss, nosebleeds, congestion, facial swelling, sneezing, mouth sores, trouble swallowing, neck stiffness, dental problem, voice change, postnasal drip, sinus pressure, tinnitus and ear discharge.   Eyes: Negative for photophobia, discharge, itching and visual disturbance.   Respiratory: Negative for apnea, cough, choking, chest tightness and stridor.   Cardiovascular: Negative for palpitations.  Gastrointestinal: Negative for nausea, constipation, blood in stool and abdominal distention.  Genitourinary: Negative for dysuria, urgency, frequency, hematuria, flank pain, decreased urine volume and difficulty urinating.  Musculoskeletal: Positive for back pain. Negative for myalgias, joint swelling, arthralgias and gait problem.  Skin: Negative for color change and pallor.  Neurological: Negative for dizziness, tremors, seizures, syncope, speech difficulty, weakness, light-headedness and numbness.  Hematological: Negative for adenopathy. Does not bruise/bleed easily.  Psychiatric/Behavioral: Negative for confusion, sleep disturbance and agitation. The patient is not nervous/anxious.         Objective:   Physical Exam BP 108/62  Pulse 70  Temp(Src) 97.9 F (36.6 C) (Oral)  Ht 5\' 9"  (1.753 m)  Wt 310 lb (140.615 kg)  BMI 45.76 kg/m2  SpO2 93%  Gen: Pleasant, obese WM, in no distress,  normal affect  ENT: No lesions,  mouth clear,  oropharynx clear, no postnasal drip  Neck: No JVD, no TMG, no carotid bruits  Lungs: No use of accessory muscles, no dullness to percussion, distant BS,   Cardiovascular: RRR, heart sounds normal, no murmur or gallops, no peripheral edema, severe varicose veins bilat R>L  Abdomen: soft and NT, no HSM,  BS normal  Musculoskeletal: No deformities, no cyanosis or clubbing  Neuro: alert, non focal  Skin: Warm, no lesions or rashes        Assessment & Plan:   COPD gold stage C. Chronic obstructive lung disease gold stage C. stable at this time Plan Maintain Dulera 200  2 puffs twice daily    Updated Medication List Outpatient Encounter Prescriptions as of 04/26/2012  Medication Sig Dispense Refill  . albuterol (PROAIR HFA) 108 (90 BASE) MCG/ACT inhaler Inhale 2 puffs into the lungs every 6 (six) hours as needed  for wheezing.      . Ascorbic Acid (VITAMIN C) 1000 MG tablet Take 1,000 mg by mouth daily.        Marland Kitchen aspirin 81 MG tablet Take 81 mg by mouth daily.        Marland Kitchen atenolol-chlorthalidone (TENORETIC) 100-25 MG per tablet Take 1 tablet by mouth Daily.      . Calcium Carb-Cholecalciferol (CALCIUM 1000 + D) 1000-800 MG-UNIT TABS Take 800 mg by mouth daily.        Marland Kitchen HYDROcodone-acetaminophen (NORCO) 10-325 MG per tablet Take  1 tablet by mouth every 6 (six) hours as needed for pain.      Marland Kitchen leflunomide (ARAVA) 20 MG tablet Take 20 mg by mouth daily. 1 daily       . metFORMIN (GLUCOPHAGE) 500 MG tablet Take 1 tablet by mouth 2 (two) times daily.      . mometasone-formoterol (DULERA) 200-5 MCG/ACT AERO Inhale 2 puffs into the lungs 2 (two) times daily. Use Advair when no samples of dulera available  2 Inhaler  0  . Multiple Vitamin (MULTIVITAMIN) tablet Take 1 tablet by mouth daily.        . Omega-3 Fatty Acids (FISH OIL) 1200 MG CAPS Take 1 capsule by mouth daily.        . Tamsulosin HCl (FLOMAX) 0.4 MG CAPS Take 0.4 mg by mouth daily.       Marland Kitchen telmisartan (MICARDIS) 40 MG tablet Take 40 mg by mouth daily.        . Fluticasone-Salmeterol (ADVAIR DISKUS) 250-50 MCG/DOSE AEPB Use when no Dulera available  60 each  11  . [DISCONTINUED] HYDROcodone-acetaminophen (LORTAB) 10-500 MG per tablet Take 1 tablet by mouth Three times a day.       No facility-administered encounter medications on file as of 04/26/2012.

## 2012-04-27 NOTE — Assessment & Plan Note (Signed)
Chronic obstructive lung disease gold stage C. stable at this time Plan Maintain Dulera 200  2 puffs twice daily

## 2012-08-06 ENCOUNTER — Telehealth: Payer: Self-pay | Admitting: Critical Care Medicine

## 2012-08-06 MED ORDER — MOMETASONE FURO-FORMOTEROL FUM 200-5 MCG/ACT IN AERO: 2.0000 | INHALATION_SPRAY | Freq: Two times a day (BID) | RESPIRATORY_TRACT | Status: DC

## 2012-08-06 NOTE — Telephone Encounter (Signed)
I spoke with pt and is aware 1 sample of the dulera will be left upfront at the [facility] office. He voiced his understanding and needed nothing further

## 2012-08-11 DIAGNOSIS — R351 Nocturia: Secondary | ICD-10-CM | POA: Insufficient documentation

## 2012-08-11 DIAGNOSIS — G8929 Other chronic pain: Secondary | ICD-10-CM | POA: Insufficient documentation

## 2012-08-23 DIAGNOSIS — M4310 Spondylolisthesis, site unspecified: Secondary | ICD-10-CM | POA: Insufficient documentation

## 2012-09-17 ENCOUNTER — Telehealth: Payer: Self-pay | Admitting: Critical Care Medicine

## 2012-09-17 MED ORDER — MOMETASONE FURO-FORMOTEROL FUM 200-5 MCG/ACT IN AERO: 2.0000 | INHALATION_SPRAY | Freq: Two times a day (BID) | RESPIRATORY_TRACT | Status: DC

## 2012-09-17 NOTE — Telephone Encounter (Signed)
Spoke with patient patient aware sample will be placed upfront at the [place] office Patient verbalized understanding and nothing further needed at this time

## 2012-10-22 ENCOUNTER — Telehealth: Payer: Self-pay | Admitting: Critical Care Medicine

## 2012-10-22 MED ORDER — MOMETASONE FURO-FORMOTEROL FUM 200-5 MCG/ACT IN AERO: 2.0000 | INHALATION_SPRAY | Freq: Two times a day (BID) | RESPIRATORY_TRACT | Status: DC

## 2012-10-22 NOTE — Telephone Encounter (Signed)
Samples left at front and pt is aware and will pick-up in [place]. Carron Curie, CMA

## 2012-10-25 ENCOUNTER — Encounter: Payer: Self-pay | Admitting: Critical Care Medicine

## 2012-10-25 ENCOUNTER — Ambulatory Visit (INDEPENDENT_AMBULATORY_CARE_PROVIDER_SITE_OTHER): Payer: Self-pay | Admitting: Critical Care Medicine

## 2012-10-25 VITALS — BP 124/70 | HR 77 | Temp 98.8°F | Ht 69.0 in | Wt 327.0 lb

## 2012-10-25 DIAGNOSIS — E119 Type 2 diabetes mellitus without complications: Secondary | ICD-10-CM

## 2012-10-25 DIAGNOSIS — I83893 Varicose veins of bilateral lower extremities with other complications: Secondary | ICD-10-CM | POA: Insufficient documentation

## 2012-10-25 DIAGNOSIS — J449 Chronic obstructive pulmonary disease, unspecified: Secondary | ICD-10-CM

## 2012-10-25 NOTE — Assessment & Plan Note (Signed)
Chronic obstructive bronchitis with moderate airflow obstruction on spirometry noted in 2012 note airway function slightly worse owing to restrictive process in 2014 Plan Maintain inhaled medications as prescribed

## 2012-10-25 NOTE — Progress Notes (Signed)
Subjective:    Patient ID: Anthony Friedman, male    DOB: 29-Dec-1950, 62 y.o.   MRN: [id number]  HPI   62 y.o.  WM gold stage C. COPD  10/25/2012 Chief Complaint  Patient presents with  . 6 month follow up    Breahting unchanged.  Has DOE.  No wheezing, chest tightness, chest pain, or cough.   since the last visit in January 2014 this patient has stabilized. He has had continued weight gain which is affecting his breathing. There is less cough less wheezing. There is no chest pain. There is no excess mucus production. CAT Score 10/25/2012 10/25/2012  Total CAT Score 8 13      Pt denies any significant sore throat, nasal congestion or excess secretions, fever, chills, sweats, unintended weight loss, pleurtic or exertional chest pain, orthopnea PND, or leg swelling Pt denies any increase in rescue therapy over baseline, denies waking up needing it or having any early am or nocturnal exacerbations of coughing/wheezing/or dyspnea. Pt also denies any obvious fluctuation in symptoms with  weather or environmental change or other alleviating or aggravating factors Past Medical History  Diagnosis Date  . Chronic venous insufficiency     superficial  . Superficial thrombophlebitis     subacute-on anticoagulation  . Leg pain   . Edema     lower extremities  . Venous stasis     changes  . SOBOE (shortness of breath on exertion)   . RA (rheumatoid arthritis)     Dx 2005  . Liver enlargement     d/t arava  . Elevated liver enzymes     d/t arava  . Atelectasis   . Chronic allergic rhinitis   . Cough   . Obesity   . Hypertension   . Pallor      Family History  Problem Relation Age of Onset  . Heart attack Father   . Esophageal cancer Mother   . Other Mother     Tachycardia  . Diabetes Brother      History   Social History  . Marital Status: Married    Spouse Name: N/A    Number of Children: 4  . Years of Education: N/A   Occupational History  . disabled      former  Chief Financial Officer, office work    Social History Main Topics  . Smoking status: Former Smoker -- 1.00 packs/day for 40 years    Types: Cigarettes    Quit date: 09/05/2007  . Smokeless tobacco: Never Used  . Alcohol Use: Yes     Comment: occasional wine and liquor  . Drug Use: No  . Sexual Activity: Not on file   Other Topics Concern  . Not on file   Social History Narrative  . No narrative on file     Allergies  Allergen Reactions  . Sulfa Antibiotics     Blood in urine     Outpatient Prescriptions Prior to Visit  Medication Sig Dispense Refill  . albuterol (PROAIR HFA) 108 (90 BASE) MCG/ACT inhaler Inhale 2 puffs into the lungs every 6 (six) hours as needed for wheezing.      . Ascorbic Acid (VITAMIN C) 1000 MG tablet Take 1,000 mg by mouth daily.        Marland Kitchen aspirin 81 MG tablet Take 81 mg by mouth daily.        Marland Kitchen atenolol-chlorthalidone (TENORETIC) 100-25 MG per tablet Take 1 tablet by mouth Daily.      . Calcium  Carb-Cholecalciferol (CALCIUM 1000 + D) 1000-800 MG-UNIT TABS Take 800 mg by mouth daily.        Marland Kitchen HYDROcodone-acetaminophen (NORCO) 10-325 MG per tablet Take 1 tablet by mouth every 6 (six) hours as needed for pain.      Marland Kitchen leflunomide (ARAVA) 20 MG tablet Take 20 mg by mouth daily. 1 daily       . metFORMIN (GLUCOPHAGE) 500 MG tablet Take 1 tablet by mouth 2 (two) times daily.      . mometasone-formoterol (DULERA) 200-5 MCG/ACT AERO Inhale 2 puffs into the lungs 2 (two) times daily. Use Advair when no samples of dulera available  1 Inhaler  0  . Multiple Vitamin (MULTIVITAMIN) tablet Take 1 tablet by mouth daily.        . Omega-3 Fatty Acids (FISH OIL) 1200 MG CAPS Take 1 capsule by mouth daily.        . Tamsulosin HCl (FLOMAX) 0.4 MG CAPS Take 0.4 mg by mouth daily.       . Fluticasone-Salmeterol (ADVAIR DISKUS) 250-50 MCG/DOSE AEPB Use when no Dulera available  60 each  11  . telmisartan (MICARDIS) 40 MG tablet Take 40 mg by mouth daily.         No  facility-administered medications prior to visit.     Review of Systems  Constitutional: Negative for chills, diaphoresis, activity change, appetite change, fatigue and unexpected weight change.  HENT: Negative for hearing loss, nosebleeds, congestion, facial swelling, sneezing, mouth sores, trouble swallowing, neck stiffness, dental problem, voice change, postnasal drip, sinus pressure, tinnitus and ear discharge.   Eyes: Negative for photophobia, discharge, itching and visual disturbance.  Respiratory: Negative for apnea, cough, choking, chest tightness and stridor.   Cardiovascular: Negative for palpitations.  Gastrointestinal: Negative for nausea, constipation, blood in stool and abdominal distention.  Genitourinary: Negative for dysuria, urgency, frequency, hematuria, flank pain, decreased urine volume and difficulty urinating.  Musculoskeletal: Positive for back pain. Negative for myalgias, joint swelling, arthralgias and gait problem.  Skin: Negative for color change and pallor.  Neurological: Negative for dizziness, tremors, seizures, syncope, speech difficulty, weakness, light-headedness and numbness.  Hematological: Negative for adenopathy. Does not bruise/bleed easily.  Psychiatric/Behavioral: Negative for confusion, sleep disturbance and agitation. The patient is not nervous/anxious.         Objective:   Physical Exam BP 124/70  Pulse 77  Temp(Src) 98.8 F (37.1 C) (Oral)  Ht 5\' 9"  (1.753 m)  Wt 327 lb (148.326 kg)  BMI 48.27 kg/m2  SpO2 92%  Gen: Pleasant, obese WM, in no distress,  normal affect  ENT: No lesions,  mouth clear,  oropharynx clear, no postnasal drip  Neck: No JVD, no TMG, no carotid bruits  Lungs: No use of accessory muscles, no dullness to percussion, distant BS,   Cardiovascular: RRR, heart sounds normal, no murmur or gallops, no peripheral edema, severe varicose veins bilat R>L  Abdomen: soft and NT, no HSM,  BS normal  Musculoskeletal: No  deformities, no cyanosis or clubbing  Neuro: alert, non focal  Skin: Warm, no lesions or rashes        Assessment & Plan:   COPD gold stage C. Chronic obstructive bronchitis with moderate airflow obstruction on spirometry noted in 2012 note airway function slightly worse owing to restrictive process in 2014 Plan Maintain inhaled medications as prescribed    Updated Medication List Outpatient Encounter Prescriptions as of 10/25/2012  Medication Sig Dispense Refill  . albuterol (PROAIR HFA) 108 (90 BASE) MCG/ACT inhaler Inhale  2 puffs into the lungs every 6 (six) hours as needed for wheezing.      . Ascorbic Acid (VITAMIN C) 1000 MG tablet Take 1,000 mg by mouth daily.        Marland Kitchen aspirin 81 MG tablet Take 81 mg by mouth daily.        Marland Kitchen atenolol-chlorthalidone (TENORETIC) 100-25 MG per tablet Take 1 tablet by mouth Daily.      . Calcium Carb-Cholecalciferol (CALCIUM 1000 + D) 1000-800 MG-UNIT TABS Take 800 mg by mouth daily.        Marland Kitchen HYDROcodone-acetaminophen (NORCO) 10-325 MG per tablet Take 1 tablet by mouth every 6 (six) hours as needed for pain.      Marland Kitchen leflunomide (ARAVA) 20 MG tablet Take 20 mg by mouth daily. 1 daily       . losartan (COZAAR) 50 MG tablet Take 1 tablet by mouth daily.      . metFORMIN (GLUCOPHAGE) 500 MG tablet Take 1 tablet by mouth 2 (two) times daily.      . mometasone-formoterol (DULERA) 200-5 MCG/ACT AERO Inhale 2 puffs into the lungs 2 (two) times daily. Use Advair when no samples of dulera available  1 Inhaler  0  . Multiple Vitamin (MULTIVITAMIN) tablet Take 1 tablet by mouth daily.        . Omega-3 Fatty Acids (FISH OIL) 1200 MG CAPS Take 1 capsule by mouth daily.        . Tamsulosin HCl (FLOMAX) 0.4 MG CAPS Take 0.4 mg by mouth daily.       . [DISCONTINUED] Fluticasone-Salmeterol (ADVAIR DISKUS) 250-50 MCG/DOSE AEPB Use when no Dulera available  60 each  11  . [DISCONTINUED] telmisartan (MICARDIS) 40 MG tablet Take 40 mg by mouth daily.         No  facility-administered encounter medications on file as of 10/25/2012.

## 2012-10-25 NOTE — Patient Instructions (Addendum)
Stay on dulera Focus on weight loss Return 6 months

## 2012-11-20 DIAGNOSIS — M961 Postlaminectomy syndrome, not elsewhere classified: Secondary | ICD-10-CM | POA: Insufficient documentation

## 2012-12-20 DIAGNOSIS — F419 Anxiety disorder, unspecified: Secondary | ICD-10-CM | POA: Insufficient documentation

## 2012-12-20 DIAGNOSIS — M51379 Other intervertebral disc degeneration, lumbosacral region without mention of lumbar back pain or lower extremity pain: Secondary | ICD-10-CM | POA: Insufficient documentation

## 2012-12-20 DIAGNOSIS — M5137 Other intervertebral disc degeneration, lumbosacral region: Secondary | ICD-10-CM | POA: Insufficient documentation

## 2013-01-15 ENCOUNTER — Telehealth: Payer: Self-pay | Admitting: Critical Care Medicine

## 2013-01-15 MED ORDER — MOMETASONE FURO-FORMOTEROL FUM 200-5 MCG/ACT IN AERO: 2.0000 | INHALATION_SPRAY | Freq: Two times a day (BID) | RESPIRATORY_TRACT | Status: DC

## 2013-01-15 NOTE — Telephone Encounter (Signed)
PT AWARE SAMPLE LEFT UPFRONT FOR PICK UP IN [place] office. Nothing further needed

## 2013-02-12 ENCOUNTER — Telehealth: Payer: Self-pay | Admitting: Critical Care Medicine

## 2013-02-12 NOTE — Telephone Encounter (Signed)
We have no dulera 200 samples at this time. Called, spoke with pt - Informed him of above. He verbalized understanding.  Pt has medication right now and will check back next wk to see if we have gotten any in.

## 2013-02-18 ENCOUNTER — Telehealth: Payer: Self-pay | Admitting: Critical Care Medicine

## 2013-02-18 MED ORDER — MOMETASONE FURO-FORMOTEROL FUM 200-5 MCG/ACT IN AERO: 2.0000 | INHALATION_SPRAY | Freq: Two times a day (BID) | RESPIRATORY_TRACT | Status: DC

## 2013-02-18 NOTE — Telephone Encounter (Signed)
Sample at front. Pt is aware. Jennifer Castillo, CMA  

## 2013-03-21 ENCOUNTER — Telehealth: Payer: Self-pay | Admitting: Critical Care Medicine

## 2013-03-21 NOTE — Telephone Encounter (Signed)
lmom to make pt aware that we have no samples of the dulera.

## 2013-04-01 ENCOUNTER — Telehealth: Payer: Self-pay | Admitting: Critical Care Medicine

## 2013-04-01 NOTE — Telephone Encounter (Signed)
Noted - will check and let pt know.

## 2013-04-01 NOTE — Telephone Encounter (Signed)
I called and spoke with pt. He is requesting a sample of dulera 200 mcg. We have none in [place]. He wanted to know if any is in [place]. Crystal will be out there tomorrow. Pt would like to know if he could get a sample in [place] if any out there. Crystal would you please check tomorrow in [place]? thanks

## 2013-04-02 NOTE — Telephone Encounter (Signed)
We do not have any dulera 200 samples in [place] right now. lmomtcb to inform pt.

## 2013-04-02 NOTE — Telephone Encounter (Signed)
Pt returned call.  Advised that there are no dulera samples in [place] or [place] at this time.  Pt verbalized understanding.  Anthony Friedman

## 2013-04-04 DIAGNOSIS — E119 Type 2 diabetes mellitus without complications: Secondary | ICD-10-CM | POA: Insufficient documentation

## 2013-04-04 DIAGNOSIS — G2581 Restless legs syndrome: Secondary | ICD-10-CM | POA: Insufficient documentation

## 2013-04-04 DIAGNOSIS — I8390 Asymptomatic varicose veins of unspecified lower extremity: Secondary | ICD-10-CM | POA: Insufficient documentation

## 2013-04-04 DIAGNOSIS — N400 Enlarged prostate without lower urinary tract symptoms: Secondary | ICD-10-CM | POA: Insufficient documentation

## 2013-04-15 ENCOUNTER — Telehealth: Payer: Self-pay | Admitting: Critical Care Medicine

## 2013-04-15 NOTE — Telephone Encounter (Signed)
We do not have samples of Dulera. Pt asks if we have samples of Advair 250. Per PW documentation, pt can have Advair when we do not have samples of Dulera. Samples will be left up front for pick up.

## 2013-05-06 ENCOUNTER — Telehealth: Payer: Self-pay | Admitting: Critical Care Medicine

## 2013-05-06 NOTE — Telephone Encounter (Signed)
Called, spoke with pt.  He is requesting dulera sample.  Currently, we do not have any.  Pt is aware and voiced no further questions or concerns at this time.

## 2013-05-15 ENCOUNTER — Telehealth: Payer: Self-pay | Admitting: Critical Care Medicine

## 2013-05-15 NOTE — Telephone Encounter (Signed)
Called and spoke w/ pt. Made him aware we did not have any samples at this time. He can;t afford RX bc it is over $100. He will call back next week to see if we have any samples

## 2013-05-17 ENCOUNTER — Telehealth: Payer: Self-pay | Admitting: Critical Care Medicine

## 2013-05-17 MED ORDER — MOMETASONE FURO-FORMOTEROL FUM 200-5 MCG/ACT IN AERO: 2.0000 | INHALATION_SPRAY | Freq: Two times a day (BID) | RESPIRATORY_TRACT | Status: DC

## 2013-05-17 NOTE — Telephone Encounter (Signed)
Pt has a voucher for a free 30 day supply of Dulera. He asks that we send in a prescription. This has been done. Nothing further is needed.

## 2013-05-27 ENCOUNTER — Telehealth: Payer: Self-pay | Admitting: Critical Care Medicine

## 2013-05-27 NOTE — Telephone Encounter (Signed)
Spoke with pt. He reports he is trying to get tier exception for his dulera.  He reports I need to call Francine Graven [phone] Id# [id number] Called # and after prompts was advised they wait time was 30 min.  wcb

## 2013-05-29 NOTE — Telephone Encounter (Signed)
Called [employer].  Tier exception for dulera 200 is needed.  This has been initiated.  We are to receive a response within 24-72 hours via fax.  Will hold msg to follow up on.

## 2013-05-31 NOTE — Telephone Encounter (Signed)
Spoke with Crystal, she has not seen anything come across on pt's PA for [facility] and spoke with rep Chloe  Call reference # :: [id number]  They have no record of Crystal's PA -- Will need to call [phone] pharmacy team.  Will need to call 06/03/13

## 2013-06-04 ENCOUNTER — Other Ambulatory Visit: Payer: Self-pay | Admitting: Critical Care Medicine

## 2013-06-04 NOTE — Telephone Encounter (Signed)
Requesting to speak to nurse.  [phone]

## 2013-06-04 NOTE — Telephone Encounter (Signed)
Called [employer]  Was given a different number to call [phone]  Medication was approved  Confirmation number [id number] Called [facility] to make them aware  Pt notified and nothing further needed

## 2013-06-05 ENCOUNTER — Telehealth: Payer: Self-pay | Admitting: Critical Care Medicine

## 2013-06-05 NOTE — Telephone Encounter (Signed)
Called spoke with pt. He reports what he was needing was a tier exception for his dulera. Not a PA.  Crystal has a form for denial for dulra but does not state for what bc PA for dulera was approved. D/t time and # in triage will hold and call [employer] in AM Per crystal she is fine with this

## 2013-06-06 NOTE — Telephone Encounter (Signed)
Called [employer] at [phone], spoke with Primrose.  Was advised Anthony Friedman is a Tier 4.  Pt is eligible for a tier exception.  This has been initiated.  Per Primrose, we will receive a fax notification of approval or denial in 24-72 hrs.   Case # [id number] Will await approval/denial on tier exception.  lmomtcb for pt to inform him tier exception has been initiated and see if he has medication at this time.

## 2013-06-06 NOTE — Telephone Encounter (Signed)
LMOM x 1 

## 2013-06-06 NOTE — Telephone Encounter (Signed)
Patient returning call.

## 2013-06-06 NOTE — Telephone Encounter (Signed)
Pt called back and he is aware that the tier exception has been initiated and that we will contact him once this has been approved or denied.  Pt voiced his understanding of this .  Pt stated that he does have medications now.

## 2013-06-10 NOTE — Telephone Encounter (Signed)
Pt has just spoken to insurance company & they have told him exception has been denied. He states he needs info from [place] in order to appeal.

## 2013-06-10 NOTE — Telephone Encounter (Signed)
Spoke with the pt  He states that tier exception was denied for the second time  He is trying to go through the appeals process  He is asking for copies of "everything that we sent the insurance"  I advised that I am unsure exactly what was sent since I did not personally take care of this  Crystal, are you aware what was sent- ? Just ov notes? Please advise thanks!

## 2013-06-10 NOTE — Telephone Encounter (Signed)
LMTCB

## 2013-06-13 MED ORDER — MOMETASONE FURO-FORMOTEROL FUM 200-5 MCG/ACT IN AERO
INHALATION_SPRAY | RESPIRATORY_TRACT | Status: DC
Start: 2013-06-13 — End: 2013-07-25

## 2013-06-13 NOTE — Telephone Encounter (Signed)
Called, spoke with pt.  Tier exception was done over the phone origionally.  It was stated during that call that pt has tried and failed spiriva and advair.  Pt states the [employer] advised the reason dulera tier exception was denied was bc they did not have documentation of him trying and failing spiriva and advair.  I called [employer] at [phone], spoke with [facility] with the Expedited Appeal Dept.  I have initiated the denial appeal over the phone with the following information:  Elwin Sleight has worked well for pt and cannot afford it as a Tier 4.  He has tried and failed Spiriva and Advair.  Dx Code 491.20.  Without dulera, pt could experience exacerbations which could lead to hospitalization and decline in pt's heath status.    [employer] has entered this information in as an expedited appeal.  Clinical notes or letter will need to be faxed with the clinical information to [phone] Attn: Expedited Appeals Dept with Refrence # [id number].    I spoke with pt.  Explained above to him.  He no longer needs to send letter to company as we are doing this as an expedited appeal.  Pt aware I will create letter, have PW sign tomorrow, and will then fax to [facility].  He verbalized understanding and was appreciative of our help.  2 dulera 200 samples placed at front for pick up.

## 2013-06-14 ENCOUNTER — Encounter: Payer: Self-pay | Admitting: *Deleted

## 2013-06-14 NOTE — Telephone Encounter (Signed)
Letter completed.  It was reviewed and signed by Dr. Delford Field.  I have faxed it to the Expedited Appeal Dept at the fax # provided below.  Will await response.

## 2013-06-17 NOTE — Telephone Encounter (Signed)
Received faxed letter from [facility] regarding the [facility] Appeal.  It has been APPROVED until Mar 06, 2014 for a tier exception.  Appropriate copayments, coinsurance, and deductibles for the effective plan year will apply.  lmomtcb for pt to inform him of this.  Approval letter placed in scan folder.

## 2013-06-17 NOTE — Telephone Encounter (Signed)
Called pt and is aware. Nothing further needed

## 2013-06-17 NOTE — Telephone Encounter (Signed)
Pt returning call.Anthony Friedman ° °

## 2013-07-25 ENCOUNTER — Encounter: Payer: Self-pay | Admitting: Critical Care Medicine

## 2013-07-25 ENCOUNTER — Ambulatory Visit (INDEPENDENT_AMBULATORY_CARE_PROVIDER_SITE_OTHER): Payer: Medicare HMO | Admitting: Critical Care Medicine

## 2013-07-25 VITALS — BP 122/74 | HR 52 | Temp 98.6°F | Ht 69.0 in | Wt 318.0 lb

## 2013-07-25 DIAGNOSIS — J449 Chronic obstructive pulmonary disease, unspecified: Secondary | ICD-10-CM

## 2013-07-25 NOTE — Patient Instructions (Signed)
No change in medications. Return in         4 months 

## 2013-07-25 NOTE — Progress Notes (Signed)
Subjective:    Patient ID: Anthony Friedman, male    DOB: [birth year], 63 y.o.   MRN: [id number]  HPI   63 y.o.  WM gold stage C. COPD    07/25/2013 Chief Complaint  Patient presents with  . 9 month follow up    Was dx with pleurisy x 8 days ago.  Breathing is better than it was 1 wk ago.  Has DOE and dry cough.   Pt had pleurisy 8d ago PCP rx pred pulse and is better.  No cxr done.  Overall dyspnea is the same.  Elwin Sleight helps a lot. Spent past 2.5 months battling with insurance.  Review of Systems  Constitutional: Negative for chills, diaphoresis, activity change, appetite change, fatigue and unexpected weight change.  HENT: Negative for congestion, dental problem, ear discharge, facial swelling, hearing loss, mouth sores, nosebleeds, postnasal drip, sinus pressure, sneezing, tinnitus, trouble swallowing and voice change.   Eyes: Negative for photophobia, discharge, itching and visual disturbance.  Respiratory: Negative for apnea, cough, choking, chest tightness and stridor.   Cardiovascular: Negative for palpitations.  Gastrointestinal: Negative for nausea, constipation, blood in stool and abdominal distention.  Genitourinary: Negative for dysuria, urgency, frequency, hematuria, flank pain, decreased urine volume and difficulty urinating.  Musculoskeletal: Positive for back pain. Negative for arthralgias, gait problem, joint swelling, myalgias and neck stiffness.  Skin: Negative for color change and pallor.  Neurological: Negative for dizziness, tremors, seizures, syncope, speech difficulty, weakness, light-headedness and numbness.  Hematological: Negative for adenopathy. Does not bruise/bleed easily.  Psychiatric/Behavioral: Negative for confusion, sleep disturbance and agitation. The patient is not nervous/anxious.         Objective:   Physical Exam BP 122/74  Pulse 52  Temp(Src) 98.6 F (37 C) (Oral)  Ht 5\' 9"  (1.753 m)  Wt 318 lb (144.244 kg)  BMI 46.94 kg/m2  SpO2  93%  Gen: Pleasant, obese WM, in no distress,  normal affect  ENT: No lesions,  mouth clear,  oropharynx clear, no postnasal drip  Neck: No JVD, no TMG, no carotid bruits  Lungs: No use of accessory muscles, no dullness to percussion, distant BS,   Cardiovascular: RRR, heart sounds normal, no murmur or gallops, no peripheral edema, severe varicose veins bilat R>L  Abdomen: soft and NT, no HSM,  BS normal  Musculoskeletal: No deformities, no cyanosis or clubbing  Neuro: alert, non focal  Skin: Warm, no lesions or rashes        Assessment & Plan:   COPD gold stage C. Copd Gold C stable at present Plan  No change in medications. Return in          4 months    Updated Medication List Outpatient Encounter Prescriptions as of 07/25/2013  Medication Sig  . albuterol (PROAIR HFA) 108 (90 BASE) MCG/ACT inhaler Inhale 2 puffs into the lungs every 6 (six) hours as needed for wheezing.  . Ascorbic Acid (VITAMIN C) 1000 MG tablet Take 1,000 mg by mouth daily.    07/27/2013 aspirin 81 MG tablet Take 81 mg by mouth daily.    Marland Kitchen atenolol-chlorthalidone (TENORETIC) 100-25 MG per tablet Take 1 tablet by mouth Daily.  . Calcium Carb-Cholecalciferol (CALCIUM 1000 + D) 1000-800 MG-UNIT TABS Take 800 mg by mouth daily.    . folic acid (FOLVITE) 1 MG tablet Take 1 tablet by mouth daily.  Marland Kitchen HYDROcodone-acetaminophen (NORCO) 10-325 MG per tablet Take 1 tablet by mouth every 6 (six) hours as needed for pain.  Marland Kitchen losartan (COZAAR) 50 MG  tablet Take 1 tablet by mouth daily.  . metFORMIN (GLUCOPHAGE) 500 MG tablet Take 1 tablet by mouth 2 (two) times daily.  . methotrexate (RHEUMATREX) 2.5 MG tablet Take 8 tablets by mouth once a week.  . mometasone-formoterol (DULERA) 200-5 MCG/ACT AERO INHALE 2 PUFFS INTO THE LUNGS once daily. USE ADVAIR WHEN NO SAMPLES OF DULERA AVAILABLE  . Multiple Vitamin (MULTIVITAMIN) tablet Take 1 tablet by mouth daily.    . Omega-3 Fatty Acids (FISH OIL) 1200 MG CAPS Take 1 capsule  by mouth daily.    . predniSONE (STERAPRED UNI-PAK) 5 MG TABS tablet Take as directed  . rOPINIRole (REQUIP) 1 MG tablet Take 1 tablet by mouth daily.  . Tamsulosin HCl (FLOMAX) 0.4 MG CAPS On hold  . [DISCONTINUED] mometasone-formoterol (DULERA) 200-5 MCG/ACT AERO INHALE 2 PUFFS INTO THE LUNGS 2 (TWO) TIMES DAILY. USE ADVAIR WHEN NO SAMPLES OF DULERA AVAILABLE  . [DISCONTINUED] leflunomide (ARAVA) 20 MG tablet Take 20 mg by mouth daily. 1 daily

## 2013-07-25 NOTE — Assessment & Plan Note (Signed)
Copd Gold C stable at present Plan  No change in medications. Return in          4 months

## 2013-08-14 DIAGNOSIS — N3281 Overactive bladder: Secondary | ICD-10-CM | POA: Insufficient documentation

## 2013-08-26 ENCOUNTER — Other Ambulatory Visit (HOSPITAL_COMMUNITY): Payer: Self-pay | Admitting: Orthopedic Surgery

## 2013-08-26 DIAGNOSIS — M25562 Pain in left knee: Secondary | ICD-10-CM

## 2013-09-03 ENCOUNTER — Encounter (HOSPITAL_COMMUNITY): Payer: Medicare HMO

## 2013-09-03 ENCOUNTER — Ambulatory Visit (HOSPITAL_COMMUNITY): Payer: Medicare HMO

## 2013-09-05 DIAGNOSIS — M542 Cervicalgia: Secondary | ICD-10-CM | POA: Insufficient documentation

## 2013-09-13 DIAGNOSIS — M47812 Spondylosis without myelopathy or radiculopathy, cervical region: Secondary | ICD-10-CM | POA: Insufficient documentation

## 2013-10-07 ENCOUNTER — Telehealth: Payer: Self-pay | Admitting: Critical Care Medicine

## 2013-10-07 MED ORDER — MOMETASONE FURO-FORMOTEROL FUM 200-5 MCG/ACT IN AERO: 2.0000 | INHALATION_SPRAY | Freq: Two times a day (BID) | RESPIRATORY_TRACT | Status: DC

## 2013-10-07 NOTE — Telephone Encounter (Signed)
lmomtcb x1  Samples left for pick up in [place] office

## 2013-10-07 NOTE — Telephone Encounter (Signed)
Pt returned call.  Per [name], ok to tell pt about samples & close message.  Pt verbalized understanding & states nothing further needed at this time.  Anthony Friedman

## 2013-10-29 DIAGNOSIS — R609 Edema, unspecified: Secondary | ICD-10-CM | POA: Insufficient documentation

## 2013-10-29 DIAGNOSIS — I998 Other disorder of circulatory system: Secondary | ICD-10-CM | POA: Insufficient documentation

## 2013-10-29 DIAGNOSIS — R600 Localized edema: Secondary | ICD-10-CM | POA: Insufficient documentation

## 2013-12-19 ENCOUNTER — Encounter: Payer: Self-pay | Admitting: Critical Care Medicine

## 2013-12-19 ENCOUNTER — Ambulatory Visit: Payer: Medicare HMO | Admitting: Critical Care Medicine

## 2013-12-19 ENCOUNTER — Ambulatory Visit (INDEPENDENT_AMBULATORY_CARE_PROVIDER_SITE_OTHER): Payer: Medicare HMO | Admitting: Critical Care Medicine

## 2013-12-19 VITALS — BP 97/60 | HR 73 | Temp 98.4°F | Ht 69.0 in | Wt 318.0 lb

## 2013-12-19 DIAGNOSIS — J449 Chronic obstructive pulmonary disease, unspecified: Secondary | ICD-10-CM

## 2013-12-19 DIAGNOSIS — Z23 Encounter for immunization: Secondary | ICD-10-CM

## 2013-12-19 NOTE — Progress Notes (Signed)
Subjective:    Patient ID: Anthony Friedman, male    DOB: Nov 27, 1950, 63 y.o.   MRN: [id number]  HPI   63 y.o.  WM gold stage C. COPD  12/19/2013 Chief Complaint  Patient presents with  . COPD    follow-up. Pt feels SOB is same as last visit. Pt states a couple weeks ago he was having pain in his back and SOB and was diagnosed with shingles.   No real changes .  Dx with shingles.  No prior dx blood clots D dimer sl elevated.  CXR done recently.  Dx shingles. Dyspnea is unchanged. Mucus is clear. No GERD symptoms   Review of Systems  Constitutional: Negative for chills, diaphoresis, activity change, appetite change, fatigue and unexpected weight change.  HENT: Negative for congestion, dental problem, ear discharge, facial swelling, hearing loss, mouth sores, nosebleeds, postnasal drip, sinus pressure, sneezing, tinnitus, trouble swallowing and voice change.   Eyes: Negative for photophobia, discharge, itching and visual disturbance.  Respiratory: Negative for apnea, cough, choking, chest tightness and stridor.   Cardiovascular: Negative for palpitations.  Gastrointestinal: Negative for nausea, constipation, blood in stool and abdominal distention.  Genitourinary: Negative for dysuria, urgency, frequency, hematuria, flank pain, decreased urine volume and difficulty urinating.  Musculoskeletal: Positive for back pain. Negative for arthralgias, gait problem, joint swelling, myalgias and neck stiffness.  Skin: Negative for color change and pallor.  Neurological: Negative for dizziness, tremors, seizures, syncope, speech difficulty, weakness, light-headedness and numbness.  Hematological: Negative for adenopathy. Does not bruise/bleed easily.  Psychiatric/Behavioral: Negative for confusion, sleep disturbance and agitation. The patient is not nervous/anxious.         Objective:   Physical Exam BP 97/60  Pulse 73  Temp(Src) 98.4 F (36.9 C) (Oral)  Ht 5\' 9"  (1.753 m)  Wt 318 lb (144.244  kg)  BMI 46.94 kg/m2  SpO2 95%  Gen: Pleasant, obese WM, in no distress,  normal affect  ENT: No lesions,  mouth clear,  oropharynx clear, no postnasal drip  Neck: No JVD, no TMG, no carotid bruits  Lungs: No use of accessory muscles, no dullness to percussion, distant BS,   Cardiovascular: RRR, heart sounds normal, no murmur or gallops, no peripheral edema, severe varicose veins bilat R>L  Abdomen: soft and NT, no HSM,  BS normal  Musculoskeletal: No deformities, no cyanosis or clubbing  Neuro: alert, non focal  Skin: Warm, no lesions or rashes        Assessment & Plan:   COPD gold stage C. Gold stage C. COPD with a fixed obstructive component and also restrictive component Plan Maintain Dulera 200  2 puff twice daily Administer flu vaccine    Updated Medication List Outpatient Encounter Prescriptions as of 12/19/2013  Medication Sig  . albuterol (PROAIR HFA) 108 (90 BASE) MCG/ACT inhaler Inhale 2 puffs into the lungs every 6 (six) hours as needed for wheezing.  . Ascorbic Acid (VITAMIN C) 1000 MG tablet Take 1,000 mg by mouth daily.    12/21/2013 aspirin 81 MG tablet Take 81 mg by mouth daily.    Marland Kitchen atenolol-chlorthalidone (TENORETIC) 100-25 MG per tablet Take 1 tablet by mouth Daily.  . Calcium Carb-Cholecalciferol (CALCIUM 1000 + D) 1000-800 MG-UNIT TABS Take 800 mg by mouth daily.    . folic acid (FOLVITE) 1 MG tablet Take 1 tablet by mouth daily.  . furosemide (LASIX) 20 MG tablet Take 20 mg by mouth daily.  Marland Kitchen gabapentin (NEURONTIN) 300 MG capsule Take 1 capsule by mouth 2 (  two) times daily.  Marland Kitchen HYDROcodone-acetaminophen (NORCO) 10-325 MG per tablet Take 1 tablet by mouth every 6 (six) hours as needed for pain.  Marland Kitchen losartan (COZAAR) 50 MG tablet Take 1 tablet by mouth daily.  . metFORMIN (GLUCOPHAGE) 500 MG tablet Take 1 tablet by mouth 2 (two) times daily.  . methotrexate (RHEUMATREX) 2.5 MG tablet Take 8 tablets by mouth once a week.  . mometasone-formoterol (DULERA)  200-5 MCG/ACT AERO Inhale 2 puffs into the lungs 2 (two) times daily. INHALE 2 PUFFS INTO THE LUNGS once daily. USE ADVAIR WHEN NO SAMPLES OF DULERA AVAILABLE  . Multiple Vitamin (MULTIVITAMIN) tablet Take 1 tablet by mouth daily.    . Omega-3 Fatty Acids (FISH OIL) 1200 MG CAPS Take 1 capsule by mouth daily.    Marland Kitchen rOPINIRole (REQUIP) 1 MG tablet Take 1 tablet by mouth daily.  . Tamsulosin HCl (FLOMAX) 0.4 MG CAPS On hold  . [DISCONTINUED] predniSONE (STERAPRED UNI-PAK) 5 MG TABS tablet Take as directed

## 2013-12-19 NOTE — Patient Instructions (Signed)
No change in medications. Return in        4 months Flu vaccine was given 

## 2013-12-20 NOTE — Assessment & Plan Note (Signed)
Gold stage C. COPD with a fixed obstructive component and also restrictive component Plan Maintain Dulera 200  2 puff twice daily Administer flu vaccine

## 2014-02-10 ENCOUNTER — Telehealth: Payer: Self-pay | Admitting: Critical Care Medicine

## 2014-02-10 MED ORDER — MOMETASONE FURO-FORMOTEROL FUM 200-5 MCG/ACT IN AERO: 2.0000 | INHALATION_SPRAY | Freq: Two times a day (BID) | RESPIRATORY_TRACT | Status: DC

## 2014-02-10 NOTE — Telephone Encounter (Signed)
Pt is requesting sample of dulera. Sample at front. Pt is aware. Carron Curie, CMA

## 2014-02-10 NOTE — Telephone Encounter (Signed)
lmtcb X1 for pt  

## 2014-04-17 DIAGNOSIS — M1610 Unilateral primary osteoarthritis, unspecified hip: Secondary | ICD-10-CM | POA: Insufficient documentation

## 2014-04-21 ENCOUNTER — Telehealth: Payer: Self-pay | Admitting: Critical Care Medicine

## 2014-04-21 MED ORDER — MOMETASONE FURO-FORMOTEROL FUM 200-5 MCG/ACT IN AERO: 2.0000 | INHALATION_SPRAY | Freq: Two times a day (BID) | RESPIRATORY_TRACT | Status: DC

## 2014-04-21 NOTE — Telephone Encounter (Signed)
Called and lmom for the pt and sample of the dulera has been left up front.  i have lmom to make the pt aware about sample.  Nothing further is needed.

## 2014-06-03 ENCOUNTER — Telehealth: Payer: Self-pay | Admitting: Critical Care Medicine

## 2014-06-03 NOTE — Telephone Encounter (Signed)
Samples will be left up front. Pt is aware. Nothing further was needed.

## 2014-07-28 NOTE — Progress Notes (Signed)
Called for orders surgery 08-08-14 pre op [id number] Thanks

## 2014-07-31 ENCOUNTER — Other Ambulatory Visit (HOSPITAL_COMMUNITY): Payer: Self-pay | Admitting: Orthopaedic Surgery

## 2014-08-05 ENCOUNTER — Encounter (HOSPITAL_COMMUNITY): Payer: Self-pay

## 2014-08-05 ENCOUNTER — Encounter (HOSPITAL_COMMUNITY)
Admission: RE | Admit: 2014-08-05 | Discharge: 2014-08-05 | Disposition: A | Discharge status: Home / Self Care | Payer: Medicare HMO | Source: Ambulatory Visit | Attending: Orthopaedic Surgery | Admitting: Orthopaedic Surgery

## 2014-08-05 DIAGNOSIS — I10 Essential (primary) hypertension: Secondary | ICD-10-CM

## 2014-08-05 DIAGNOSIS — E119 Type 2 diabetes mellitus without complications: Secondary | ICD-10-CM

## 2014-08-05 DIAGNOSIS — Z01810 Encounter for preprocedural cardiovascular examination: Secondary | ICD-10-CM | POA: Insufficient documentation

## 2014-08-05 HISTORY — DX: Urinary tract infection, site not specified: A49.9

## 2014-08-05 HISTORY — DX: Frequency of micturition: R35.0

## 2014-08-05 HISTORY — DX: Calculus of kidney: N20.0

## 2014-08-05 HISTORY — DX: Pneumonia, unspecified organism: J18.9

## 2014-08-05 HISTORY — DX: Type 2 diabetes mellitus without complications: E11.9

## 2014-08-05 HISTORY — DX: Family history of other specified conditions: Z84.89

## 2014-08-05 HISTORY — DX: Chronic obstructive pulmonary disease, unspecified: J44.9

## 2014-08-05 HISTORY — DX: Urinary tract infection, site not specified: N39.0

## 2014-08-05 LAB — COMPREHENSIVE METABOLIC PANEL
ALK PHOS: 90 U/L (ref 38–126)
ALT: 23 U/L (ref 17–63)
AST: 22 U/L (ref 15–41)
Albumin: 3.9 g/dL (ref 3.5–5.0)
Anion gap: 8 (ref 5–15)
BUN: 30 mg/dL — ABNORMAL HIGH (ref 6–20)
CO2: 35 mmol/L — ABNORMAL HIGH (ref 22–32)
Calcium: 9.2 mg/dL (ref 8.9–10.3)
Chloride: 93 mmol/L — ABNORMAL LOW (ref 101–111)
Creatinine, Ser: 0.93 mg/dL (ref 0.61–1.24)
GFR calc non Af Amer: >60 mL/min (ref >60)
GLUCOSE: 104 mg/dL — AB (ref 65–99)
POTASSIUM: 4.4 mmol/L (ref 3.5–5.1)
SODIUM: 136 mmol/L (ref 135–145)
Total Bilirubin: 0.7 mg/dL (ref 0.3–1.2)
Total Protein: 7.2 g/dL (ref 6.5–8.1)

## 2014-08-05 LAB — PROTIME-INR
INR: 0.94 (ref 0.00–1.49)
PROTHROMBIN TIME: 12.7 s (ref 11.6–15.2)

## 2014-08-05 LAB — SURGICAL PCR SCREEN
MRSA, PCR: NEGATIVE
Staphylococcus aureus: NEGATIVE

## 2014-08-05 LAB — CBC
HCT: 38.5 % — ABNORMAL LOW (ref 39.0–52.0)
Hemoglobin: 12.8 g/dL — ABNORMAL LOW (ref 13.0–17.0)
MCH: 31.7 pg (ref 26.0–34.0)
MCHC: 33.2 g/dL (ref 30.0–36.0)
MCV: 95.3 fL (ref 78.0–100.0)
PLATELETS: 266 10*3/uL (ref 150–400)
RBC: 4.04 MIL/uL — ABNORMAL LOW (ref 4.22–5.81)
WBC: 10.3 10*3/uL (ref 4.0–10.5)

## 2014-08-05 LAB — APTT: aPTT: 37 seconds (ref 24–37)

## 2014-08-05 NOTE — Progress Notes (Signed)
CBC and CMP results per PAT visit 08/05/2014 per epic sent to Dr Maureen Ralphs

## 2014-08-05 NOTE — Progress Notes (Signed)
Your patient has screened at an elevated risk for Obstructive Sleep Apnea using the Stop-Bang Tool during a pre-surgical vist. A score of 4 or greater is an elevated risk. Score of 5.  

## 2014-08-05 NOTE — Patient Instructions (Signed)
Amare Bail  08/05/2014   Your procedure is scheduled on: Friday August 08, 2014   Report to [facility] Main  Entrance and follow signs to               Short Stay Center arrive at 5:30 AM.  Call this number if you have problems the morning of surgery [phone]   Remember: ONLY 1 PERSON MAY GO WITH YOU TO SHORT STAY TO GET  READY MORNING OF YOUR SURGERY.  Do not eat food or drink liquids :After Midnight. EAT A HEALTHY SNACK NIGHT PRIOR TO SURGERY.      Take these medicines the morning of surgery with A SIP OF WATER: Hydrocodone-Acetaminophen if needed; Tamsulosin (Flomax); May use Albuterol Inhaler if needed (Bring with you day of surgery); Dulera Inhaler (Bring day of surgery)                               You may not have any metal on your body including hair pins and              piercings  Do not wear jewelry, colognes, lotions, powders or deodorant             Men may shave face and neck.   Do not bring valuables to the hospital. [facility] IS NOT             RESPONSIBLE   FOR VALUABLES.  Contacts, dentures or bridgework may not be worn into surgery.  Leave suitcase in the car. After surgery it may be brought to your room.      Special Instructions: deep breathing exercises               Please read over the following fact sheets you were given:MRSA Fact Sheet _____________________________________________________________________             [facility] - Preparing for Surgery Before surgery, you can play an important role.  Because skin is not sterile, your skin needs to be as free of germs as possible.  You can reduce the number of germs on your skin by washing with CHG (chlorahexidine gluconate) soap before surgery.  CHG is an antiseptic cleaner which kills germs and bonds with the skin to continue killing germs even after washing. Please DO NOT use if you have an allergy to CHG or antibacterial soaps.  If your skin becomes reddened/irritated stop  using the CHG and inform your nurse when you arrive at Short Stay. Do not shave (including legs and underarms) for at least 48 hours prior to the first CHG shower.  You may shave your face/neck. Please follow these instructions carefully:  1.  Shower with CHG Soap the night before surgery and the  morning of Surgery.  2.  If you choose to wash your hair, wash your hair first as usual with your  normal  shampoo.  3.  After you shampoo, rinse your hair and body thoroughly to remove the  shampoo.                           4.  Use CHG as you would any other liquid soap.  You can apply chg directly  to the skin and wash  Gently with a scrungie or clean washcloth.  5.  Apply the CHG Soap to your body ONLY FROM THE NECK DOWN.   Do not use on face/ open                           Wound or open sores. Avoid contact with eyes, ears mouth and genitals (private parts).                       Wash face,  Genitals (private parts) with your normal soap.             6.  Wash thoroughly, paying special attention to the area where your surgery  will be performed.  7.  Thoroughly rinse your body with warm water from the neck down.  8.  DO NOT shower/wash with your normal soap after using and rinsing off  the CHG Soap.                9.  Pat yourself dry with a clean towel.            10.  Wear clean pajamas.            11.  Place clean sheets on your bed the night of your first shower and do not  sleep with pets. Day of Surgery : Do not apply any lotions/deodorants the morning of surgery.  Please wear clean clothes to the hospital/surgery center.  FAILURE TO FOLLOW THESE INSTRUCTIONS MAY RESULT IN THE CANCELLATION OF YOUR SURGERY PATIENT SIGNATURE_________________________________  NURSE SIGNATURE__________________________________  ________________________________________________________________________

## 2014-08-05 NOTE — Progress Notes (Addendum)
CXR on chart 12/07/2013 H&P per chart per Eunice Blase Smothers FNP 05/02/2014  ECHO results per chart 12/06/2013  Spoke with Dr Reece Agar Smith/anesthesia in regards to pt having a stimulator implant in back to control pain. Also discussed CXR results per chart. Anesthesia to see pt day of surgery.  06/21/2010 per epic - pulmonary function test

## 2014-08-07 MED ORDER — DEXTROSE 5 % IV SOLN
3.0000 g | INTRAVENOUS | Status: AC
  Administered 2014-08-08: 3 g via INTRAVENOUS
  Filled 2014-08-07: qty 3000

## 2014-08-08 ENCOUNTER — Inpatient Hospital Stay (HOSPITAL_COMMUNITY): Payer: Medicare HMO | Admitting: Anesthesiology

## 2014-08-08 ENCOUNTER — Inpatient Hospital Stay (HOSPITAL_COMMUNITY): Payer: Medicare HMO

## 2014-08-08 ENCOUNTER — Encounter (HOSPITAL_COMMUNITY)
Admission: RE | Disposition: A | Discharge status: Skilled Nursing Facility | Payer: Self-pay | Source: Ambulatory Visit | Attending: Orthopaedic Surgery

## 2014-08-08 ENCOUNTER — Inpatient Hospital Stay (HOSPITAL_COMMUNITY)
Admission: RE | Admit: 2014-08-08 | Discharge: 2014-08-11 | DRG: 470 | Disposition: A | Discharge status: Skilled Nursing Facility | Payer: Medicare HMO | Source: Ambulatory Visit | Attending: Orthopaedic Surgery | Admitting: Orthopaedic Surgery

## 2014-08-08 ENCOUNTER — Encounter (HOSPITAL_COMMUNITY): Payer: Self-pay | Admitting: *Deleted

## 2014-08-08 DIAGNOSIS — Z79899 Other long term (current) drug therapy: Secondary | ICD-10-CM | POA: Diagnosis not present

## 2014-08-08 DIAGNOSIS — Z96653 Presence of artificial knee joint, bilateral: Secondary | ICD-10-CM | POA: Diagnosis present

## 2014-08-08 DIAGNOSIS — Z7982 Long term (current) use of aspirin: Secondary | ICD-10-CM | POA: Diagnosis not present

## 2014-08-08 DIAGNOSIS — E119 Type 2 diabetes mellitus without complications: Secondary | ICD-10-CM | POA: Diagnosis present

## 2014-08-08 DIAGNOSIS — Z87891 Personal history of nicotine dependence: Secondary | ICD-10-CM | POA: Diagnosis not present

## 2014-08-08 DIAGNOSIS — M069 Rheumatoid arthritis, unspecified: Secondary | ICD-10-CM | POA: Diagnosis present

## 2014-08-08 DIAGNOSIS — I878 Other specified disorders of veins: Secondary | ICD-10-CM | POA: Diagnosis present

## 2014-08-08 DIAGNOSIS — I872 Venous insufficiency (chronic) (peripheral): Secondary | ICD-10-CM | POA: Diagnosis present

## 2014-08-08 DIAGNOSIS — J449 Chronic obstructive pulmonary disease, unspecified: Secondary | ICD-10-CM | POA: Diagnosis present

## 2014-08-08 DIAGNOSIS — Z8249 Family history of ischemic heart disease and other diseases of the circulatory system: Secondary | ICD-10-CM

## 2014-08-08 DIAGNOSIS — Z800 Family history of malignant neoplasm of digestive organs: Secondary | ICD-10-CM | POA: Diagnosis not present

## 2014-08-08 DIAGNOSIS — Z882 Allergy status to sulfonamides status: Secondary | ICD-10-CM | POA: Diagnosis not present

## 2014-08-08 DIAGNOSIS — Z87442 Personal history of urinary calculi: Secondary | ICD-10-CM

## 2014-08-08 DIAGNOSIS — Z96641 Presence of right artificial hip joint: Secondary | ICD-10-CM

## 2014-08-08 DIAGNOSIS — Z833 Family history of diabetes mellitus: Secondary | ICD-10-CM | POA: Diagnosis not present

## 2014-08-08 DIAGNOSIS — Z8672 Personal history of thrombophlebitis: Secondary | ICD-10-CM | POA: Diagnosis not present

## 2014-08-08 DIAGNOSIS — Z01812 Encounter for preprocedural laboratory examination: Secondary | ICD-10-CM | POA: Diagnosis not present

## 2014-08-08 DIAGNOSIS — Z419 Encounter for procedure for purposes other than remedying health state, unspecified: Secondary | ICD-10-CM

## 2014-08-08 DIAGNOSIS — Z6841 Body Mass Index (BMI) 40.0 and over, adult: Secondary | ICD-10-CM

## 2014-08-08 DIAGNOSIS — I10 Essential (primary) hypertension: Secondary | ICD-10-CM | POA: Diagnosis present

## 2014-08-08 DIAGNOSIS — Z01810 Encounter for preprocedural cardiovascular examination: Secondary | ICD-10-CM | POA: Diagnosis not present

## 2014-08-08 DIAGNOSIS — M25551 Pain in right hip: Secondary | ICD-10-CM | POA: Diagnosis present

## 2014-08-08 DIAGNOSIS — Z79891 Long term (current) use of opiate analgesic: Secondary | ICD-10-CM

## 2014-08-08 DIAGNOSIS — M1611 Unilateral primary osteoarthritis, right hip: Secondary | ICD-10-CM | POA: Diagnosis present

## 2014-08-08 HISTORY — PX: TOTAL HIP ARTHROPLASTY: SHX124

## 2014-08-08 LAB — GLUCOSE, CAPILLARY
GLUCOSE-CAPILLARY: 114 mg/dL — AB (ref 65–99)
GLUCOSE-CAPILLARY: 112 mg/dL — AB (ref 65–99)
Glucose-Capillary: 118 mg/dL — ABNORMAL HIGH (ref 65–99)
Glucose-Capillary: 119 mg/dL — ABNORMAL HIGH (ref 65–99)

## 2014-08-08 LAB — TYPE AND SCREEN
ABO/RH(D): AB POS
ANTIBODY SCREEN: NEGATIVE
DAT, IgG: NEGATIVE

## 2014-08-08 SURGERY — ARTHROPLASTY, HIP, TOTAL, ANTERIOR APPROACH
Anesthesia: General | Site: Hip | Laterality: Right

## 2014-08-08 MED ORDER — ASPIRIN EC 325 MG PO TBEC
325.0000 mg | DELAYED_RELEASE_TABLET | Freq: Two times a day (BID) | ORAL | Status: DC
  Administered 2014-08-08 – 2014-08-11 (×6): 325 mg via ORAL
  Filled 2014-08-08 (×8): qty 1

## 2014-08-08 MED ORDER — ATENOLOL 100 MG PO TABS
100.0000 mg | ORAL_TABLET | Freq: Every day | ORAL | Status: DC
  Administered 2014-08-09 – 2014-08-11 (×3): 100 mg via ORAL
  Filled 2014-08-08 (×4): qty 1

## 2014-08-08 MED ORDER — ZOLPIDEM TARTRATE 5 MG PO TABS: 5.0000 mg | ORAL_TABLET | Freq: Every evening | ORAL | Status: DC | PRN

## 2014-08-08 MED ORDER — MOMETASONE FURO-FORMOTEROL FUM 200-5 MCG/ACT IN AERO
2.0000 | INHALATION_SPRAY | Freq: Two times a day (BID) | RESPIRATORY_TRACT | Status: DC
  Administered 2014-08-09 – 2014-08-11 (×5): 2 via RESPIRATORY_TRACT
  Filled 2014-08-08: qty 8.8

## 2014-08-08 MED ORDER — METOCLOPRAMIDE HCL 10 MG PO TABS: 5.0000 mg | ORAL_TABLET | Freq: Three times a day (TID) | ORAL | Status: DC | PRN

## 2014-08-08 MED ORDER — FENTANYL CITRATE (PF) 100 MCG/2ML IJ SOLN
INTRAMUSCULAR | Status: AC
  Filled 2014-08-08: qty 2

## 2014-08-08 MED ORDER — HYDROMORPHONE HCL 1 MG/ML IJ SOLN
0.2500 mg | INTRAMUSCULAR | Status: DC | PRN
  Administered 2014-08-08 (×2): 0.5 mg via INTRAVENOUS

## 2014-08-08 MED ORDER — ATENOLOL-CHLORTHALIDONE 100-25 MG PO TABS: 1.0000 | ORAL_TABLET | Freq: Every morning | ORAL | Status: DC

## 2014-08-08 MED ORDER — ONDANSETRON HCL 4 MG PO TABS: 4.0000 mg | ORAL_TABLET | Freq: Four times a day (QID) | ORAL | Status: DC | PRN

## 2014-08-08 MED ORDER — HYDROMORPHONE HCL 2 MG/ML IJ SOLN
INTRAMUSCULAR | Status: AC
Start: 2014-08-08 — End: 2014-08-08
  Filled 2014-08-08: qty 1

## 2014-08-08 MED ORDER — FOLIC ACID 1 MG PO TABS
1.0000 mg | ORAL_TABLET | Freq: Every morning | ORAL | Status: DC
  Administered 2014-08-08 – 2014-08-11 (×4): 1 mg via ORAL
  Filled 2014-08-08 (×4): qty 1

## 2014-08-08 MED ORDER — OXYCODONE HCL 5 MG PO TABS
5.0000 mg | ORAL_TABLET | ORAL | Status: DC | PRN
  Administered 2014-08-08: 5 mg via ORAL
  Administered 2014-08-08 (×3): 15 mg via ORAL
  Administered 2014-08-08: 10 mg via ORAL
  Administered 2014-08-09 – 2014-08-11 (×10): 15 mg via ORAL
  Filled 2014-08-08: qty 3
  Filled 2014-08-08: qty 2
  Filled 2014-08-08 (×7): qty 3
  Filled 2014-08-08: qty 1
  Filled 2014-08-08 (×5): qty 3

## 2014-08-08 MED ORDER — METHOCARBAMOL 1000 MG/10ML IJ SOLN
500.0000 mg | Freq: Four times a day (QID) | INTRAVENOUS | Status: DC | PRN
  Administered 2014-08-08: 500 mg via INTRAVENOUS
  Filled 2014-08-08 (×2): qty 5

## 2014-08-08 MED ORDER — ALUM & MAG HYDROXIDE-SIMETH 200-200-20 MG/5ML PO SUSP: 30.0000 mL | ORAL | Status: DC | PRN

## 2014-08-08 MED ORDER — PROPOFOL 10 MG/ML IV BOLUS
INTRAVENOUS | Status: AC
  Filled 2014-08-08: qty 20

## 2014-08-08 MED ORDER — HYDROMORPHONE HCL 1 MG/ML IJ SOLN
1.0000 mg | INTRAMUSCULAR | Status: DC | PRN
  Administered 2014-08-08 – 2014-08-11 (×10): 1 mg via INTRAVENOUS
  Filled 2014-08-08 (×10): qty 1

## 2014-08-08 MED ORDER — LIDOCAINE HCL (CARDIAC) 20 MG/ML IV SOLN
INTRAVENOUS | Status: DC | PRN
  Administered 2014-08-08: 80 mg via INTRAVENOUS

## 2014-08-08 MED ORDER — INSULIN ASPART 100 UNIT/ML ~~LOC~~ SOLN: 0.0000 [IU] | Freq: Every day | SUBCUTANEOUS | Status: DC

## 2014-08-08 MED ORDER — MEPERIDINE HCL 50 MG/ML IJ SOLN: 6.2500 mg | INTRAMUSCULAR | Status: DC | PRN

## 2014-08-08 MED ORDER — METHOCARBAMOL 500 MG PO TABS
500.0000 mg | ORAL_TABLET | Freq: Four times a day (QID) | ORAL | Status: DC | PRN
  Administered 2014-08-08 – 2014-08-11 (×7): 500 mg via ORAL
  Filled 2014-08-08 (×8): qty 1

## 2014-08-08 MED ORDER — SODIUM CHLORIDE 0.9 % IV SOLN
INTRAVENOUS | Status: DC
  Administered 2014-08-08: 13:00:00 via INTRAVENOUS

## 2014-08-08 MED ORDER — METFORMIN HCL 500 MG PO TABS
500.0000 mg | ORAL_TABLET | Freq: Two times a day (BID) | ORAL | Status: DC
  Administered 2014-08-08 – 2014-08-11 (×6): 500 mg via ORAL
  Filled 2014-08-08 (×9): qty 1

## 2014-08-08 MED ORDER — ONDANSETRON HCL 4 MG/2ML IJ SOLN
INTRAMUSCULAR | Status: DC | PRN
  Administered 2014-08-08: 4 mg via INTRAVENOUS

## 2014-08-08 MED ORDER — DIPHENHYDRAMINE HCL 12.5 MG/5ML PO ELIX
12.5000 mg | ORAL_SOLUTION | ORAL | Status: DC | PRN
  Administered 2014-08-09: 25 mg via ORAL
  Filled 2014-08-08: qty 10

## 2014-08-08 MED ORDER — MIDAZOLAM HCL 5 MG/5ML IJ SOLN
INTRAMUSCULAR | Status: DC | PRN
  Administered 2014-08-08: 2 mg via INTRAVENOUS

## 2014-08-08 MED ORDER — MIDAZOLAM HCL 2 MG/2ML IJ SOLN
INTRAMUSCULAR | Status: AC
  Filled 2014-08-08: qty 2

## 2014-08-08 MED ORDER — HYDROMORPHONE HCL 1 MG/ML IJ SOLN
INTRAMUSCULAR | Status: DC | PRN
  Administered 2014-08-08 (×2): 1 mg via INTRAVENOUS

## 2014-08-08 MED ORDER — OXYCODONE HCL ER 20 MG PO T12A
40.0000 mg | EXTENDED_RELEASE_TABLET | Freq: Two times a day (BID) | ORAL | Status: DC
  Administered 2014-08-08 – 2014-08-11 (×7): 40 mg via ORAL
  Filled 2014-08-08 (×7): qty 2

## 2014-08-08 MED ORDER — ACETAMINOPHEN 650 MG RE SUPP: 650.0000 mg | Freq: Four times a day (QID) | RECTAL | Status: DC | PRN

## 2014-08-08 MED ORDER — TAMSULOSIN HCL 0.4 MG PO CAPS
0.4000 mg | ORAL_CAPSULE | Freq: Every morning | ORAL | Status: DC
  Administered 2014-08-08 – 2014-08-11 (×4): 0.4 mg via ORAL
  Filled 2014-08-08 (×4): qty 1

## 2014-08-08 MED ORDER — LIDOCAINE HCL (CARDIAC) 20 MG/ML IV SOLN
INTRAVENOUS | Status: AC
  Filled 2014-08-08: qty 5

## 2014-08-08 MED ORDER — INSULIN ASPART 100 UNIT/ML ~~LOC~~ SOLN: 0.0000 [IU] | Freq: Three times a day (TID) | SUBCUTANEOUS | Status: DC

## 2014-08-08 MED ORDER — ONDANSETRON HCL 4 MG/2ML IJ SOLN
INTRAMUSCULAR | Status: AC
  Filled 2014-08-08: qty 2

## 2014-08-08 MED ORDER — ALBUTEROL SULFATE (2.5 MG/3ML) 0.083% IN NEBU: 3.0000 mL | INHALATION_SOLUTION | Freq: Four times a day (QID) | RESPIRATORY_TRACT | Status: DC | PRN

## 2014-08-08 MED ORDER — SODIUM CHLORIDE 0.9 % IR SOLN
Status: DC | PRN
  Administered 2014-08-08: 1000 mL

## 2014-08-08 MED ORDER — HYDROMORPHONE HCL 1 MG/ML IJ SOLN
INTRAMUSCULAR | Status: AC
  Filled 2014-08-08: qty 1

## 2014-08-08 MED ORDER — ROPINIROLE HCL 1 MG PO TABS
1.0000 mg | ORAL_TABLET | Freq: Every day | ORAL | Status: DC | PRN
  Filled 2014-08-08: qty 1

## 2014-08-08 MED ORDER — MENTHOL 3 MG MT LOZG: 1.0000 | LOZENGE | OROMUCOSAL | Status: DC | PRN

## 2014-08-08 MED ORDER — CHLORTHALIDONE 25 MG PO TABS
25.0000 mg | ORAL_TABLET | Freq: Every day | ORAL | Status: DC
  Administered 2014-08-09 – 2014-08-11 (×3): 25 mg via ORAL
  Filled 2014-08-08 (×4): qty 1

## 2014-08-08 MED ORDER — METOCLOPRAMIDE HCL 5 MG/ML IJ SOLN: 5.0000 mg | Freq: Three times a day (TID) | INTRAMUSCULAR | Status: DC | PRN

## 2014-08-08 MED ORDER — ACETAMINOPHEN 325 MG PO TABS: 650.0000 mg | ORAL_TABLET | Freq: Four times a day (QID) | ORAL | Status: DC | PRN

## 2014-08-08 MED ORDER — LOSARTAN POTASSIUM 50 MG PO TABS
50.0000 mg | ORAL_TABLET | Freq: Every morning | ORAL | Status: DC
  Administered 2014-08-09 – 2014-08-11 (×3): 50 mg via ORAL
  Filled 2014-08-08 (×4): qty 1

## 2014-08-08 MED ORDER — PROPOFOL 10 MG/ML IV BOLUS
INTRAVENOUS | Status: DC | PRN
  Administered 2014-08-08: 200 mg via INTRAVENOUS

## 2014-08-08 MED ORDER — NEOSTIGMINE METHYLSULFATE 10 MG/10ML IV SOLN
INTRAVENOUS | Status: AC
  Filled 2014-08-08: qty 1

## 2014-08-08 MED ORDER — FENTANYL CITRATE (PF) 100 MCG/2ML IJ SOLN
INTRAMUSCULAR | Status: DC | PRN
  Administered 2014-08-08: 100 ug via INTRAVENOUS

## 2014-08-08 MED ORDER — PHENOL 1.4 % MT LIQD
1.0000 | OROMUCOSAL | Status: DC | PRN
  Filled 2014-08-08: qty 177

## 2014-08-08 MED ORDER — NEOSTIGMINE METHYLSULFATE 10 MG/10ML IV SOLN
INTRAVENOUS | Status: DC | PRN
  Administered 2014-08-08: 4 mg via INTRAVENOUS

## 2014-08-08 MED ORDER — LACTATED RINGERS IV SOLN: INTRAVENOUS | Status: DC

## 2014-08-08 MED ORDER — ATENOLOL 100 MG PO TABS
100.0000 mg | ORAL_TABLET | Freq: Once | ORAL | Status: AC
  Administered 2014-08-08: 100 mg via ORAL
  Filled 2014-08-08: qty 1

## 2014-08-08 MED ORDER — PROMETHAZINE HCL 25 MG/ML IJ SOLN: 6.2500 mg | INTRAMUSCULAR | Status: DC | PRN

## 2014-08-08 MED ORDER — FUROSEMIDE 20 MG PO TABS
20.0000 mg | ORAL_TABLET | Freq: Every morning | ORAL | Status: DC
  Administered 2014-08-08 – 2014-08-11 (×4): 20 mg via ORAL
  Filled 2014-08-08 (×4): qty 1

## 2014-08-08 MED ORDER — GLYCOPYRROLATE 0.2 MG/ML IJ SOLN
INTRAMUSCULAR | Status: AC
  Filled 2014-08-08: qty 3

## 2014-08-08 MED ORDER — ROCURONIUM BROMIDE 100 MG/10ML IV SOLN
INTRAVENOUS | Status: DC | PRN
  Administered 2014-08-08: 10 mg via INTRAVENOUS
  Administered 2014-08-08: 20 mg via INTRAVENOUS
  Administered 2014-08-08: 5 mg via INTRAVENOUS
  Administered 2014-08-08: 35 mg via INTRAVENOUS

## 2014-08-08 MED ORDER — ROCURONIUM BROMIDE 100 MG/10ML IV SOLN
INTRAVENOUS | Status: AC
  Filled 2014-08-08: qty 1

## 2014-08-08 MED ORDER — SUCCINYLCHOLINE CHLORIDE 20 MG/ML IJ SOLN
INTRAMUSCULAR | Status: DC | PRN
  Administered 2014-08-08: 160 mg via INTRAVENOUS

## 2014-08-08 MED ORDER — DOCUSATE SODIUM 100 MG PO CAPS
100.0000 mg | ORAL_CAPSULE | Freq: Two times a day (BID) | ORAL | Status: DC
  Administered 2014-08-08 – 2014-08-11 (×6): 100 mg via ORAL

## 2014-08-08 MED ORDER — LACTATED RINGERS IV SOLN
INTRAVENOUS | Status: DC | PRN
  Administered 2014-08-08 (×2): via INTRAVENOUS

## 2014-08-08 MED ORDER — CEFAZOLIN SODIUM-DEXTROSE 2-3 GM-% IV SOLR
2.0000 g | Freq: Four times a day (QID) | INTRAVENOUS | Status: AC
  Administered 2014-08-08 (×2): 2 g via INTRAVENOUS
  Filled 2014-08-08 (×2): qty 50

## 2014-08-08 MED ORDER — ONDANSETRON HCL 4 MG/2ML IJ SOLN: 4.0000 mg | Freq: Four times a day (QID) | INTRAMUSCULAR | Status: DC | PRN

## 2014-08-08 MED ORDER — GLYCOPYRROLATE 0.2 MG/ML IJ SOLN
INTRAMUSCULAR | Status: DC | PRN
  Administered 2014-08-08: 0.6 mg via INTRAVENOUS

## 2014-08-08 SURGICAL SUPPLY — 46 items
APL SKNCLS STERI-STRIP NONHPOA (GAUZE/BANDAGES/DRESSINGS)
BAG SPEC THK2 15X12 ZIP CLS (MISCELLANEOUS)
BAG ZIPLOCK 12X15 (MISCELLANEOUS) IMPLANT
BENZOIN TINCTURE PRP APPL 2/3 (GAUZE/BANDAGES/DRESSINGS) IMPLANT
BLADE SAW SGTL 18X1.27X75 (BLADE) ×2 IMPLANT
BLADE SAW SGTL 18X1.27X75MM (BLADE) ×1
CAPT HIP TOTAL 2 ×2 IMPLANT
CELLS DAT CNTRL 66122 CELL SVR (MISCELLANEOUS) ×1 IMPLANT
CLOSURE WOUND 1/2 X4 (GAUZE/BANDAGES/DRESSINGS)
COVER PERINEAL POST (MISCELLANEOUS) ×3 IMPLANT
DRAPE C-ARM 42X120 X-RAY (DRAPES) ×3 IMPLANT
DRAPE STERI IOBAN 125X83 (DRAPES) ×3 IMPLANT
DRAPE U-SHAPE 47X51 STRL (DRAPES) ×9 IMPLANT
DRSG ADAPTIC 3X8 NADH LF (GAUZE/BANDAGES/DRESSINGS) ×2 IMPLANT
DRSG AQUACEL AG ADV 3.5X10 (GAUZE/BANDAGES/DRESSINGS) ×3 IMPLANT
DURAPREP 26ML APPLICATOR (WOUND CARE) ×3 IMPLANT
ELECT BLADE TIP CTD 4 INCH (ELECTRODE) ×3 IMPLANT
ELECT REM PT RETURN 9FT ADLT (ELECTROSURGICAL) ×3
ELECTRODE REM PT RTRN 9FT ADLT (ELECTROSURGICAL) ×1 IMPLANT
FACESHIELD WRAPAROUND (MASK) ×6 IMPLANT
FACESHIELD WRAPAROUND OR TEAM (MASK) ×4 IMPLANT
GAUZE XEROFORM 1X8 LF (GAUZE/BANDAGES/DRESSINGS) IMPLANT
GLOVE BIO SURGEON STRL SZ7.5 (GLOVE) ×3 IMPLANT
GLOVE BIOGEL PI IND STRL 8 (GLOVE) ×2 IMPLANT
GLOVE BIOGEL PI INDICATOR 8 (GLOVE) ×4
GLOVE ECLIPSE 8.0 STRL XLNG CF (GLOVE) ×3 IMPLANT
GOWN STRL REUS W/TWL XL LVL3 (GOWN DISPOSABLE) ×6 IMPLANT
HANDPIECE INTERPULSE COAX TIP (DISPOSABLE) ×3
KIT BASIN OR (CUSTOM PROCEDURE TRAY) ×3 IMPLANT
PACK TOTAL JOINT (CUSTOM PROCEDURE TRAY) ×3 IMPLANT
PEN SKIN MARKING BROAD (MISCELLANEOUS) ×3 IMPLANT
RETRACTOR WND ALEXIS 18 MED (MISCELLANEOUS) ×1 IMPLANT
RTRCTR WOUND ALEXIS 18CM MED (MISCELLANEOUS) ×3
SET HNDPC FAN SPRY TIP SCT (DISPOSABLE) ×1 IMPLANT
STAPLER VISISTAT 35W (STAPLE) ×2 IMPLANT
STRIP CLOSURE SKIN 1/2X4 (GAUZE/BANDAGES/DRESSINGS) IMPLANT
SUT ETHIBOND NAB CT1 #1 30IN (SUTURE) ×3 IMPLANT
SUT MNCRL AB 4-0 PS2 18 (SUTURE) IMPLANT
SUT VIC AB 0 CT1 36 (SUTURE) ×3 IMPLANT
SUT VIC AB 1 CT1 36 (SUTURE) ×3 IMPLANT
SUT VIC AB 2-0 CT1 27 (SUTURE) ×6
SUT VIC AB 2-0 CT1 TAPERPNT 27 (SUTURE) ×2 IMPLANT
TOWEL OR 17X26 10 PK STRL BLUE (TOWEL DISPOSABLE) ×3 IMPLANT
TOWEL OR NON WOVEN STRL DISP B (DISPOSABLE) ×3 IMPLANT
TRAY FOLEY CATH 16FR SILVER (SET/KITS/TRAYS/PACK) ×2 IMPLANT
YANKAUER SUCT BULB TIP 10FT TU (MISCELLANEOUS) ×3 IMPLANT

## 2014-08-08 NOTE — Plan of Care (Signed)
Problem: Consults Goal: Diagnosis- Total Joint Replacement Primary Total Hip     

## 2014-08-08 NOTE — H&P (Signed)
TOTAL HIP ADMISSION H&P  Patient is admitted for right total hip arthroplasty.  Subjective:  Chief Complaint: right hip pain  HPI: Anthony Friedman, 64 y.o. male, has a history of pain and functional disability in the right hip(s) due to arthritis and patient has failed non-surgical conservative treatments for greater than 12 weeks to include NSAID's and/or analgesics, corticosteriod injections, use of assistive devices, weight reduction as appropriate and activity modification.  Onset of symptoms was gradual starting 3 years ago with rapidlly worsening course since that time.The patient noted no past surgery on the right hip(s).  Patient currently rates pain in the right hip at 10 out of 10 with activity. Patient has night pain, worsening of pain with activity and weight bearing, trendelenberg gait, pain that interfers with activities of daily living and pain with passive range of motion. Patient has evidence of subchondral sclerosis, periarticular osteophytes and joint space narrowing by imaging studies. This condition presents safety issues increasing the risk of falls.  There is no current active infection.  Patient Active Problem List   Diagnosis Date Noted  . Osteoarthritis of right hip 08/08/2014  . Diabetes type 2, controlled 10/25/2012  . Varicose veins of both legs with edema 10/25/2012  . RA (rheumatoid arthritis)   . Chronic venous insufficiency   . COPD gold stage C.   . Obesity   . Hypertension   . Chronic allergic rhinitis    Past Medical History  Diagnosis Date  . Chronic venous insufficiency     superficial  . Superficial thrombophlebitis     subacute-on anticoagulation  . Leg pain   . Edema     lower extremities  . Venous stasis     changes  . SOBOE (shortness of breath on exertion)   . RA (rheumatoid arthritis)     Dx 2005  . Liver enlargement     d/t arava  . Elevated liver enzymes     d/t arava  . Atelectasis   . Chronic allergic rhinitis   . Cough   .  Obesity   . Hypertension   . Pallor   . Family history of adverse reaction to anesthesia     pts mother had difficulty with anesthesia - pt not sure what difficulties were   . COPD (chronic obstructive pulmonary disease)   . Pneumonia     hx of times 3; pt states is current with pneumonia vaccine  . Diabetes mellitus without complication   . Kidney stones     hx of   . Urinary frequency   . Urinary tract bacterial infections     hx of     Past Surgical History  Procedure Laterality Date  . Hand surgery  1965    right hand  . Knee surgery      arthroscopic right and left knee  . Other surgical history      polynidal cyst  . Umbilical hernia repair    . Laminectomy  1988    and fusion; L4, L5, S1  . Laminectomy  2007    and fusion L1, L2, L3  . Replacement total knee  2009    left 2009; right 2014  . Spinal cord stimulator implant      to control back pain     Prescriptions prior to admission  Medication Sig Dispense Refill Last Dose  . albuterol (PROAIR HFA) 108 (90 BASE) MCG/ACT inhaler Inhale 2 puffs into the lungs every 6 (six) hours as needed for wheezing.  Past Month at Unknown time  . aspirin 81 MG tablet Take 81 mg by mouth every morning.    08/07/2014 at 0800  . atenolol-chlorthalidone (TENORETIC) 100-25 MG per tablet Take 1 tablet by mouth every morning.    08/07/2014 at 0800  . Calcium Carb-Cholecalciferol (CALCIUM 1000 + D) 1000-800 MG-UNIT TABS Take 800 mg by mouth every morning.    08/07/2014 at 0800  . folic acid (FOLVITE) 1 MG tablet Take 1 tablet by mouth every morning.    08/07/2014 at 0800  . furosemide (LASIX) 20 MG tablet Take 20 mg by mouth every morning.    08/07/2014 at 0800  . HYDROcodone-acetaminophen (NORCO) 10-325 MG per tablet Take 1 tablet by mouth every 6 (six) hours as needed for moderate pain.    08/08/2014 at 0400  . losartan (COZAAR) 50 MG tablet Take 1 tablet by mouth every morning.    08/07/2014 at 0800  . metFORMIN (GLUCOPHAGE) 500 MG tablet Take 1  tablet by mouth 2 (two) times daily.   08/07/2014 at 1800  . methotrexate (RHEUMATREX) 2.5 MG tablet Take 8 tablets by mouth once a week. Monday.   08/04/2014 at 0800  . mometasone-formoterol (DULERA) 200-5 MCG/ACT AERO Inhale 2 puffs into the lungs 2 (two) times daily. INHALE 2 PUFFS INTO THE LUNGS once daily. USE ADVAIR WHEN NO SAMPLES OF DULERA AVAILABLE (Patient taking differently: Inhale 2 puffs into the lungs 2 (two) times daily. As needed as stated per pt Pt states is currently only using one time per day as needed) 1 Inhaler 0 08/08/2014 at 0400  . Multiple Vitamin (MULTIVITAMIN) tablet Take 1 tablet by mouth every morning.    08/07/2014 at 0800  . Oxycodone HCl 10 MG TABS Take 10 mg by mouth every 8 (eight) hours as needed (for pain.).   0 08/07/2014 at 2300  . Phenylephrine HCl (VICKS SINEX NA) Place 1-2 sprays into the nose daily as needed (congestion.).   Past Week at Unknown time  . Tamsulosin HCl (FLOMAX) 0.4 MG CAPS Take 0.4 mg by mouth every morning.    08/07/2014 at 0800  . rOPINIRole (REQUIP) 1 MG tablet Take 1 tablet by mouth daily as needed (restless leg.).    Unknown at Unknown time   Allergies  Allergen Reactions  . Sulfa Antibiotics     Blood in urine    History  Substance Use Topics  . Smoking status: Former Smoker -- 2.00 packs/day for 45 years    Types: Cigarettes    Quit date: 03/07/2013  . Smokeless tobacco: Never Used  . 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 Heart attack Father   . Esophageal cancer Mother   . Other Mother     Tachycardia  . Diabetes Brother      Review of Systems  Musculoskeletal: Positive for back pain and joint pain.  All other systems reviewed and are negative.   Objective:  Physical Exam  Constitutional: He is oriented to person, place, and time. He appears well-developed and well-nourished.  HENT:  Head: Normocephalic and atraumatic.  Eyes:  EOM are normal.  Neck: Normal range of motion.  Cardiovascular: Normal rate and regular rhythm.   Respiratory: Effort normal and breath sounds normal.  GI: Soft. Bowel sounds are normal.  Musculoskeletal:       Right hip: He exhibits decreased range of motion, decreased  strength, tenderness and bony tenderness.  Neurological: He is alert and oriented to person, place, and time.  Skin: Skin is warm and dry.  Psychiatric: He has a normal mood and affect.    Vital signs in last 24 hours: Temp:  [97.5 F (36.4 C)] 97.5 F (36.4 C) (06/03 0549) Pulse Rate:  [60] 60 (06/03 0549) Resp:  [18] 18 (06/03 0549) BP: (125)/(57) 125/57 mmHg (06/03 0549) SpO2:  [96 %] 96 % (06/03 0549) Weight:  [151.048 kg (333 lb)] 151.048 kg (333 lb) (06/03 0601)  Labs:   Estimated body mass index is 49.15 kg/(m^2) as calculated from the following:   Height as of this encounter: 5\' 9"  (1.753 m).   Weight as of this encounter: 151.048 kg (333 lb).   Imaging Review Plain radiographs demonstrate severe degenerative joint disease of the right hip(s). The bone quality appears to be good for age and reported activity level.  Assessment/Plan:  End stage arthritis, right hip(s)  The patient history, physical examination, clinical judgement of the provider and imaging studies are consistent with end stage degenerative joint disease of the right hip(s) and total hip arthroplasty is deemed medically necessary. The treatment options including medical management, injection therapy, arthroscopy and arthroplasty were discussed at length. The risks and benefits of total hip arthroplasty were presented and reviewed. The risks due to aseptic loosening, infection, stiffness, dislocation/subluxation,  thromboembolic complications and other imponderables were discussed.  The patient acknowledged the explanation, agreed to proceed with the plan and consent was signed. Patient is being admitted for inpatient treatment for surgery,  pain control, PT, OT, prophylactic antibiotics, VTE prophylaxis, progressive ambulation and ADL's and discharge planning.The patient is planning to be discharged home with home health services

## 2014-08-08 NOTE — Transfer of Care (Signed)
Immediate Anesthesia Transfer of Care Note  Patient: Anthony Friedman  Procedure(s) Performed: Procedure(s): RIGHT TOTAL HIP ARTHROPLASTY ANTERIOR APPROACH (Right)  Patient Location: PACU  Anesthesia Type:General  Level of Consciousness: awake, alert  and oriented  Airway & Oxygen Therapy: Patient Spontanous Breathing and Patient connected to face mask oxygen  Post-op Assessment: Report given to RN and Post -op Vital signs reviewed and stable  Post vital signs: Reviewed and stable  Last Vitals:  Filed Vitals:   08/08/14 0549  BP: 125/57  Pulse: 60  Temp: 36.4 C  Resp: 18    Complications: No apparent anesthesia complications

## 2014-08-08 NOTE — Evaluation (Signed)
Physical Therapy Evaluation Patient Details Name: Anthony Friedman MRN: [id number] DOB: 05/31/1950 Today's Date: 08/08/2014   History of Present Illness  R THR - hx  of RA, COPD, obesity, back surgery x 2 and Bil TKR  Clinical Impression  Pt s/p R THR presents with decreased R LE strength/ROM, generalized weakness and deconditioning, morbid obesity and post op pain limiting functional mobility.  Pt hopes to progress to point of dc home with family assist and HHPT follow up.   Follow Up Recommendations Home health PT    Equipment Recommendations  Rolling walker with 5" wheels    Recommendations for Other Services OT consult     Precautions / Restrictions Precautions Precautions: Fall Restrictions Weight Bearing Restrictions: No      Mobility  Bed Mobility Overal bed mobility: Needs Assistance;+2 for physical assistance Bed Mobility: Supine to Sit;Sit to Supine     Supine to sit: Max assist;+2 for physical assistance Sit to supine: Max assist;+2 for physical assistance   General bed mobility comments: cues for sequence and use of L LE to self assist  Transfers Overall transfer level: Needs assistance Equipment used: Rolling walker (2 wheeled) Transfers: Sit to/from Stand Sit to Stand: From elevated surface;+2 physical assistance;Mod assist         General transfer comment: cues for LE management and use of UEs to self assist  Ambulation/Gait Ambulation/Gait assistance: Mod assist;+2 physical assistance Ambulation Distance (Feet): 4 Feet Assistive device: Rolling walker (2 wheeled) Gait Pattern/deviations: Step-to pattern;Decreased step length - right;Decreased step length - left;Shuffle;Trunk flexed     General Gait Details: Pt stepped fwd/back x 1' and side stepped up side of bed  Stairs            Wheelchair Mobility    Modified Rankin (Stroke Patients Only)       Balance                                             Pertinent  Vitals/Pain Pain Assessment: 0-10 Pain Score: 7  Pain Location: R hip Pain Descriptors / Indicators: Aching;Sore Pain Intervention(s): Limited activity within patient's tolerance;Monitored during session;Premedicated before session;Ice applied    Home Living Family/patient expects to be discharged to:: Private residence Living Arrangements: Spouse/significant other Available Help at Discharge: Family Type of Home: House Home Access: Stairs to enter Entrance Stairs-Rails: None Entrance Stairs-Number of Steps: 1 Home Layout: One level Home Equipment: Toilet riser;Cane - quad;Walker - 2 wheels;Shower seat - built in;Grab bars - tub/shower Additional Comments: Also has lift chair    Prior Function Level of Independence: Independent with assistive device(s)               Hand Dominance        Extremity/Trunk Assessment   Upper Extremity Assessment: Generalized weakness           Lower Extremity Assessment: Generalized weakness;RLE deficits/detail      Cervical / Trunk Assessment: Kyphotic  Communication   Communication: No difficulties  Cognition Arousal/Alertness: Awake/alert Behavior During Therapy: WFL for tasks assessed/performed Overall Cognitive Status: Within Functional Limits for tasks assessed                      General Comments      Exercises Total Joint Exercises Ankle Circles/Pumps: AROM;Both;15 reps;Supine Heel Slides: AAROM;Right;5 reps;Supine      Assessment/Plan  PT Assessment Patient needs continued PT services  PT Diagnosis Difficulty walking   PT Problem List Decreased strength;Decreased range of motion;Decreased activity tolerance;Decreased mobility;Decreased knowledge of use of DME;Pain;Obesity  PT Treatment Interventions DME instruction;Gait training;Therapeutic activities;Therapeutic exercise;Patient/family education   PT Goals (Current goals can be found in the Care Plan section) Acute Rehab PT Goals Patient Stated  Goal: Resume previous lifestyle with decreased pain PT Goal Formulation: With patient Time For Goal Achievement: 08/15/14 Potential to Achieve Goals: Fair    Frequency 7X/week   Barriers to discharge        Co-evaluation               End of Session   Activity Tolerance: Patient limited by pain;Patient limited by fatigue Patient left: in bed;with call bell/phone within reach;with family/visitor present Nurse Communication: Mobility status         Time: 2620-3559 PT Time Calculation (min) (ACUTE ONLY): 42 min   Charges:   PT Evaluation $Initial PT Evaluation Tier I: 1 Procedure PT Treatments $Therapeutic Activity: 23-37 mins   PT G Codes:        Juliet Vasbinder August 18, 2014, 5:49 PM

## 2014-08-08 NOTE — Anesthesia Postprocedure Evaluation (Signed)
  Anesthesia Post-op Note  Patient: Anthony Friedman  Procedure(s) Performed: Procedure(s) (LRB): RIGHT TOTAL HIP ARTHROPLASTY ANTERIOR APPROACH (Right)  Patient Location: PACU  Anesthesia Type: General  Level of Consciousness: awake and alert   Airway and Oxygen Therapy: Patient Spontanous Breathing  Post-op Pain: mild  Post-op Assessment: Post-op Vital signs reviewed, Patient's Cardiovascular Status Stable, Respiratory Function Stable, Patent Airway and No signs of Nausea or vomiting  Last Vitals:  Filed Vitals:   08/08/14 1025  BP: 102/58  Pulse: 61  Temp: 36.9 C  Resp: 15    Post-op Vital Signs: stable   Complications: No apparent anesthesia complications

## 2014-08-08 NOTE — Brief Op Note (Signed)
08/08/2014  9:24 AM  PATIENT:  Anthony Friedman  64 y.o. male  PRE-OPERATIVE DIAGNOSIS:  right hip osteoarthritis  POST-OPERATIVE DIAGNOSIS:  right hip osteoarthritis  PROCEDURE:  Procedure(s): RIGHT TOTAL HIP ARTHROPLASTY ANTERIOR APPROACH (Right)  SURGEON:  Surgeon(s) and Role:    * Kathryne Hitch, MD - Primary  ASSISTANTS: RNFA   ANESTHESIA:   general  EBL:  Total I/O In: 1000 [I.V.:1000] Out: 400 [Urine:300; Blood:100]  BLOOD ADMINISTERED:none  DRAINS: none   LOCAL MEDICATIONS USED:  NONE  SPECIMEN:  No Specimen  DISPOSITION OF SPECIMEN:  N/A  COUNTS:  YES  TOURNIQUET:  * No tourniquets in log *  DICTATION: .Other Dictation: Dictation Number [id number]  PLAN OF CARE: Admit to inpatient   PATIENT DISPOSITION:  PACU - hemodynamically stable.   Delay start of Pharmacological VTE agent (>24hrs) due to surgical blood loss or risk of bleeding: no

## 2014-08-08 NOTE — Anesthesia Preprocedure Evaluation (Addendum)
Anesthesia Evaluation  Patient identified by MRN, date of birth, ID band Patient awake    Reviewed: Allergy & Precautions, NPO status , Patient's Chart, lab work & pertinent test results  Airway Mallampati: II  TM Distance: >3 FB Neck ROM: Full    Dental no notable dental hx.    Pulmonary neg pulmonary ROS, shortness of breath, COPDformer smoker,  breath sounds clear to auscultation  Pulmonary exam normal       Cardiovascular hypertension, Pt. on medications + Peripheral Vascular Disease negative cardio ROS Normal cardiovascular examRhythm:Regular Rate:Normal     Neuro/Psych negative neurological ROS  negative psych ROS   GI/Hepatic negative GI ROS, Neg liver ROS,   Endo/Other  negative endocrine ROSdiabetesMorbid obesity  Renal/GU negative Renal ROS  negative genitourinary   Musculoskeletal negative musculoskeletal ROS (+) Arthritis -, Rheumatoid disorders,  Spinal cord stimulator for LBP   Abdominal   Peds negative pediatric ROS (+)  Hematology negative hematology ROS (+)   Anesthesia Other Findings   Reproductive/Obstetrics negative OB ROS                            Anesthesia Physical Anesthesia Plan  ASA: III  Anesthesia Plan: General   Post-op Pain Management:    Induction: Intravenous  Airway Management Planned:   Additional Equipment:   Intra-op Plan:   Post-operative Plan: Extubation in OR  Informed Consent: I have reviewed the patients History and Physical, chart, labs and discussed the procedure including the risks, benefits and alternatives for the proposed anesthesia with the patient or authorized representative who has indicated his/her understanding and acceptance.   Dental advisory given  Plan Discussed with: CRNA  Anesthesia Plan Comments: (Not a candidate for SAB. Implanted spinal cord stimulator.)        Anesthesia Quick Evaluation

## 2014-08-08 NOTE — Anesthesia Procedure Notes (Signed)
Procedure Name: Intubation Date/Time: 08/08/2014 7:33 AM Performed by: Enriqueta Shutter D Pre-anesthesia Checklist: Patient identified, Emergency Drugs available, Suction available and Patient being monitored Patient Re-evaluated:Patient Re-evaluated prior to inductionOxygen Delivery Method: Circle System Utilized Preoxygenation: Pre-oxygenation with 100% oxygen Intubation Type: IV induction Ventilation: Mask ventilation without difficulty Laryngoscope Size: Mac and 4 Grade View: Grade III Tube type: Oral Tube size: 7.5 mm Number of attempts: 1 Airway Equipment and Method: Stylet and Oral airway Placement Confirmation: ETT inserted through vocal cords under direct vision,  positive ETCO2 and breath sounds checked- equal and bilateral Secured at: 22 cm Tube secured with: Tape Dental Injury: Teeth and Oropharynx as per pre-operative assessment

## 2014-08-09 LAB — BASIC METABOLIC PANEL
Anion gap: 8 (ref 5–15)
BUN: 26 mg/dL — ABNORMAL HIGH (ref 6–20)
CO2: 33 mmol/L — ABNORMAL HIGH (ref 22–32)
Calcium: 8.9 mg/dL (ref 8.9–10.3)
Chloride: 93 mmol/L — ABNORMAL LOW (ref 101–111)
Creatinine, Ser: 1.02 mg/dL (ref 0.61–1.24)
GFR calc non Af Amer: >60 mL/min (ref >60)
GLUCOSE: 121 mg/dL — AB (ref 65–99)
Potassium: 4.2 mmol/L (ref 3.5–5.1)
SODIUM: 134 mmol/L — AB (ref 135–145)

## 2014-08-09 LAB — GLUCOSE, CAPILLARY
Glucose-Capillary: 99 mg/dL (ref 65–99)
Glucose-Capillary: 100 mg/dL — ABNORMAL HIGH (ref 65–99)
Glucose-Capillary: 119 mg/dL — ABNORMAL HIGH (ref 65–99)

## 2014-08-09 LAB — CBC
HCT: 32.8 % — ABNORMAL LOW (ref 39.0–52.0)
Hemoglobin: 11.9 g/dL — ABNORMAL LOW (ref 13.0–17.0)
MCH: 36 pg — ABNORMAL HIGH (ref 26.0–34.0)
MCHC: 36.3 g/dL — ABNORMAL HIGH (ref 30.0–36.0)
MCV: 99.1 fL (ref 78.0–100.0)
Platelets: 270 10*3/uL (ref 150–400)
RBC: 3.31 MIL/uL — ABNORMAL LOW (ref 4.22–5.81)
RDW: 14.7 % (ref 11.5–15.5)
WBC: 11.7 10*3/uL — AB (ref 4.0–10.5)

## 2014-08-09 NOTE — Discharge Instructions (Signed)

## 2014-08-09 NOTE — Op Note (Signed)
NAME:  Anthony Friedman, Anthony Friedman NO.:  [id number]  MEDICAL RECORD NO.:  [id number]  LOCATION:  1614                         FACILITY:  [facility]  PHYSICIAN:  Vanita Panda. Magnus Ivan, M.D.DATE OF BIRTH:  1950/04/22  DATE OF PROCEDURE:  08/08/2014 DATE OF DISCHARGE:                              OPERATIVE REPORT   PREOPERATIVE DIAGNOSIS:  Primary osteoarthritis and degenerative joint disease, right hip.  POSTOPERATIVE DIAGNOSIS:  Primary osteoarthritis and degenerative joint disease, right hip.  PROCEDURE:  Right total hip arthroplasty through direct anterior approach.  IMPLANTS:  DePuy sector Gription acetabular component, size 52, size 36+4 neutral polyethylene liner, size 12 Corail femoral component with varus offset (KLA), size 36+1.5 ceramic hip ball.  SURGEON:  Doneen Poisson, MD.  ASSISTANT:  RNFA.  ANESTHESIA:  General.  ANTIBIOTICS:  3 g of IV Ancef.  BLOOD LOSS:  100 mL.  COMPLICATIONS:  None.  INDICATIONS:  Anthony Friedman is a 64 year old gentleman with debilitating osteoarthritis involving his right hip.  He has developed significant changes of the superior lateral aspect of the femoral head and acetabulum with complete collapse of this area.  He is now getting to where he is almost wheelchair bound due to his pain.  He is someone who is morbidly obese, getting close to 350 pounds.  I was able to examine him and place the hip through range of motion and it was very painful to him.  It has gotten where his quality of life, his activities of daily living, and his mobility were all determinately affected by his hip.  He has had steroid injections in his hip as well as anti-inflammatories and this has not helped.  He does wish to proceed with a total hip arthroplasty with the direct anterior approach.  He understands the risks of acute blood loss anemia, nerve vessel injury, fracture infection, dislocation, and DVT, and he understands given that he is  a diabetic and morbidly obese, and his infection risk is certainly higher. He understands the goals should be decreased pain, improved mobility, and overall improved quality of life.  DESCRIPTION OF PROCEDURE:  After informed consent was obtained, the right hip was marked.  He was brought to the operating room and general anesthesia was obtained while he is on a stretcher.  A Foley catheter was placed and then both feet had traction boots applied to the neck. He was placed supine on the Hana fracture table with the perineal post in place and both legs in inline skeletal traction devices, but no traction applied.  His right operative hip was then prepped and draped with DuraPrep and sterile drapes.  A time-out was called, and he was identified as the correct patient and correct right hip.  We then performed an anterior approach to the hip.  We made an incision just inferior and posterior to the anterior superior iliac spine and carried this obliquely down the leg.  We dissected down the tensor fascia lata muscle and tensor fascia was then divided longitudinally, so I could proceed with a direct anterior approach to the hip.  We identified and cauterized the lateral femoral circumflex vessels and identified the hip capsule.  We placed  Cobra retractor in the lateral medial femoral neck. We then opened up his hip capsule in L-type format, exposing the hip joint and a large effusion.  We placed Cobra retractors within the hip capsule.  We then made our femoral neck cut with an oscillating saw proximal to the lesser trochanter and completed this with an osteotome. We placed a corkscrew guide in the femoral head and removed the femoral head in its entirety and found to be devoid of cartilage.  I then cleaned down the acetabulum and the rim of the acetabular labrum and debris.  I placed a bent Hohmann over the medial acetabular rim and then began reaming under direct visualization from a size 42  up to a size 52 in 2 mm increments.  All reamers again were under direct visualization, the last reamer under direct fluoroscopy, so we could obtain our depth of reaming, our inclination, abduction, and anteversion.  Once I was pleased with this, I placed the real DePuy Sector Gription acetabular component size 52 and apex hole eliminator and a 36+4 neutral polyethylene liner.  Attention was then turned to the femur with the leg externally rotated to 100 degrees extended and adducted.  We were able to place a new retractor medially and Hohmann retractor behind the greater trochanter.  I released the lateral joint capsule and then used a box cutting osteotome in the inner from canal and a rongeur to lateralize, and we then began broaching from a size 8, broached up to a size 12, based off was varus offset, we trialed a varus offset neck and 36+1.5 hip ball.  We rolled the leg back over and up with traction and internal rotation, reduced in the pelvis.  I was pleased with his leg lengths and offset under direct as well as his mobility and range of motion with stability past 90 degrees of external rotation and 45 degrees of internal rotation.  He had minimal Shuck.  We then dislocated the hip and removed the trial femoral component.  We placed the real Corail femoral component from DePuy size 12 with varus offset followed by the real 36+1.5 ceramic hip ball.  We reduced this in the acetabulum, and again I was pleased with stability,  copiously irrigated the soft tissues with normal saline solution using pulsatile lavage.  I was able to close the joint capsule with interrupted #1 Ethibond suture followed by #1 Vicryl in the tensor fascia, 0-Vicryl in deep tissue, 2-0 Vicryl in subcutaneous tissue, and staples on the skin.  Xeroform and an Aquacel sterile dressing was applied, and he was taken off the Hana table, awakened, extubated, and taken to the recovery room in stable condition.  All  final counts were correct and no complications were noted.  Of note, the RNFA was very essential to help in all aspects of this case.     Vanita Panda. Magnus Ivan, M.D.     CYB/MEDQ  D:  08/08/2014  T:  08/09/2014  Job:  [id number]

## 2014-08-09 NOTE — Progress Notes (Signed)
Subjective: 1 Day Post-Op Procedure(s) (LRB): RIGHT TOTAL HIP ARTHROPLASTY ANTERIOR APPROACH (Right) Patient reports pain as severe.    Objective: Vital signs in last 24 hours: Temp:  [97.6 F (36.4 C)-98.8 F (37.1 C)] 98.8 F (37.1 C) (06/04 0432) Pulse Rate:  [51-175] 64 (06/04 0432) Resp:  [13-18] 17 (06/04 0432) BP: (92-124)/(52-82) 113/72 mmHg (06/04 0432) SpO2:  [90 %-100 %] 94 % (06/04 0432)  Intake/Output from previous day: 06/03 0701 - 06/04 0700 In: 3256.3 [P.O.:680; I.V.:2526.3; IV Piggyback:50] Out: 1375 [Urine:1275; Blood:100] Intake/Output this shift:     Recent Labs  08/09/14 0445  HGB 11.9*    Recent Labs  08/09/14 0445  WBC 11.7*  RBC 3.31*  HCT 32.8*  PLT 270    Recent Labs  08/09/14 0445  NA 134*  K 4.2  CL 93*  CO2 33*  BUN 26*  CREATININE 1.02  GLUCOSE 121*  CALCIUM 8.9   No results for input(s): LABPT, INR in the last 72 hours.  Neurologically intact  Slight drainage on dressing.   Assessment/Plan: 1 Day Post-Op Procedure(s) (LRB): RIGHT TOTAL HIP ARTHROPLASTY ANTERIOR APPROACH (Right) Up with therapy  YATES,MARK C 08/09/2014, 7:09 AM

## 2014-08-09 NOTE — Care Management Note (Signed)
Case Management Note  Patient Details  Name: Anthony Friedman MRN: [id number] Date of Birth: September 20, 1950  Subjective/Objective:  64 y/o m admitted w/R THA.From home. Has support, has cane, rw.                  Action/Plan:[facility] chosen for HHPT.Await HHC orders, & face to face.   Expected Discharge Date:                  Expected Discharge Plan:  Home w Home Health Services  In-House Referral:     Discharge planning Services  CM Consult  Post Acute Care Choice:    Choice offered to:     DME Arranged:    DME Agency:     HH Arranged:    HH Agency:  [facility] rep Tiffany aware of HHPT recc.Will follow for HHPT order, & f27f.Nsg informed.)  Status of Service:  In process, will continue to follow  Medicare Important Message Given:    Date Medicare IM Given:    Medicare IM give by:    Date Additional Medicare IM Given:    Additional Medicare Important Message give by:     If discussed at Long Length of Stay Meetings, dates discussed:    Additional Comments:  Lanier Clam, RN 08/09/2014, 9:41 AM

## 2014-08-09 NOTE — Progress Notes (Signed)
Physical Therapy Treatment Patient Details Name: Anthony Friedman MRN: [id number] DOB: 08/13/1950 Today's Date: 08/09/2014    History of Present Illness R THR - hx  of RA, COPD, obesity, back surgery x 2 and Bil TKR    PT Comments    Pt struggling to progress with mobility 2* pain, obesity and premorbid deconditioning.  Pt would benefit from follow up rehab at SNF level to maximize IND and safety prior to return home.  Follow Up Recommendations  SNF     Equipment Recommendations  Rolling walker with 5" wheels    Recommendations for Other Services OT consult     Precautions / Restrictions Precautions Precautions: Fall Restrictions Weight Bearing Restrictions: No Other Position/Activity Restrictions: WBAT    Mobility  Bed Mobility Overal bed mobility: Needs Assistance;+2 for physical assistance Bed Mobility: Supine to Sit;Sit to Supine     Supine to sit: Max assist;+2 for physical assistance Sit to supine: Max assist;+2 for physical assistance   General bed mobility comments: step by step cues and assist to lift UB and to lift LEs onto and off of bed. Moves very slowly   Transfers Overall transfer level: Needs assistance Equipment used: Rolling walker (2 wheeled) Transfers: Sit to/from Stand Sit to Stand: From elevated surface;Mod assist;+2 physical assistance         General transfer comment: max verbal cues for hand placement and safety   Ambulation/Gait Ambulation/Gait assistance: Mod assist;+2 physical assistance;+2 safety/equipment Ambulation Distance (Feet): 2 Feet Assistive device: Rolling walker (2 wheeled) Gait Pattern/deviations: Step-to pattern;Decreased step length - right;Decreased step length - left;Shuffle;Trunk flexed Gait velocity: decrrrrreased   General Gait Details: Cues for sequence, posture, position from RW and UE WB.  Pt struggling 2* pain to sufficiently support with UEs to allow advancement of LEs   Stairs            Wheelchair  Mobility    Modified Rankin (Stroke Patients Only)       Balance Overall balance assessment: Needs assistance Sitting-balance support: Feet supported Sitting balance-Leahy Scale: Fair     Standing balance support: Bilateral upper extremity supported Standing balance-Leahy Scale: Poor                      Cognition Arousal/Alertness: Awake/alert Behavior During Therapy: Anxious Overall Cognitive Status: Within Functional Limits for tasks assessed                      Exercises Total Joint Exercises Ankle Circles/Pumps: AROM;Both;15 reps;Supine Heel Slides: AAROM;Right;Supine;15 reps Hip ABduction/ADduction: AAROM;15 reps;Right;Supine    General Comments        Pertinent Vitals/Pain Pain Assessment: 0-10 Pain Score: 9  Pain Location: R hip Pain Descriptors / Indicators: Aching;Sharp Pain Intervention(s): Limited activity within patient's tolerance;Monitored during session;Premedicated before session;Ice applied    Home Living                      Prior Function            PT Goals (current goals can now be found in the care plan section) Acute Rehab PT Goals Patient Stated Goal: decrease pain  PT Goal Formulation: With patient Time For Goal Achievement: 08/15/14 Potential to Achieve Goals: Fair Progress towards PT goals: Progressing toward goals    Frequency  7X/week    PT Plan Discharge plan needs to be updated    Co-evaluation PT/OT/SLP Co-Evaluation/Treatment: Yes Reason for Co-Treatment: For patient/therapist safety PT goals addressed during  session: Mobility/safety with mobility;Strengthening/ROM;Proper use of DME OT goals addressed during session: ADL's and self-care     End of Session Equipment Utilized During Treatment: Gait belt Activity Tolerance: Patient limited by pain;Patient limited by fatigue Patient left: in bed;with call bell/phone within reach;with family/visitor present     Time: 1217-1309 PT Time  Calculation (min) (ACUTE ONLY): 52 min  Charges:  $Gait Training: 8-22 mins $Therapeutic Exercise: 8-22 mins $Therapeutic Activity: 8-22 mins                    G Codes:      Anthony Friedman 2014-08-26, 5:39 PM

## 2014-08-09 NOTE — Evaluation (Signed)
Occupational Therapy Evaluation Patient Details Name: Anthony Friedman MRN: [id number] DOB: [birth year] Today's Date: 08/09/2014    History of Present Illness R THR - hx  of RA, COPD, obesity, back surgery x 2 and Bil TKR   Clinical Impression   Pt admitted with above. He demonstrates the below listed deficits and will benefit from continued OT to maximize safety and independence with BADLs.  Pt is moving very slowly due to 9/10 - 10/10 pain Rt hip.  He requires max A for LB ADLs and mod A +2 for very limited functional mobility.  Do not anticipate spouse will be able to safely assist him at discharge, recommend SNF.       Follow Up Recommendations  SNF    Equipment Recommendations  None recommended by OT    Recommendations for Other Services       Precautions / Restrictions Precautions Precautions: Fall Restrictions Weight Bearing Restrictions: No      Mobility Bed Mobility Overal bed mobility: Needs Assistance;+2 for physical assistance Bed Mobility: Supine to Sit;Sit to Supine     Supine to sit: Max assist;+2 for physical assistance Sit to supine: Max assist;+2 for physical assistance   General bed mobility comments: step by step cues and assist to lift UB and to lift LEs onto and off of bed. Moves very slowly   Transfers Overall transfer level: Needs assistance Equipment used: Rolling walker (2 wheeled) Transfers: Sit to/from [employer] Sit to Stand: From elevated surface;Mod assist;+2 physical assistance         General transfer comment: max verbal cues for hand placement and safety     Balance Overall balance assessment: Needs assistance Sitting-balance support: Feet supported Sitting balance-Leahy Scale: Fair     Standing balance support: Bilateral upper extremity supported Standing balance-Leahy Scale: Poor                              ADL Overall ADL's : Needs assistance/impaired Eating/Feeding: Independent   Grooming:  Wash/dry hands;Wash/dry face;Oral care;Set up;Sitting   Upper Body Bathing: Minimal assitance;Sitting   Lower Body Bathing: Maximal assistance;Sit to/from stand;Bed level   Upper Body Dressing : Minimal assistance;Sitting   Lower Body Dressing: Total assistance;Sit to/from stand   Toilet Transfer: Total assistance Toilet Transfer Details (indicate cue type and reason): unable  Toileting- Clothing Manipulation and Hygiene: Total assistance;Bed level;+2 for physical assistance       Functional mobility during ADLs: Moderate assistance;+2 for physical assistance;Rolling walker (sit to stand only) General ADL Comments: Pt moving very slowly.  Limited by pain. Tolerates only minimal activity      Vision     Perception     Praxis      Pertinent Vitals/Pain Pain Assessment: 0-10 Pain Score: 9  Pain Location: Rt hip  Pain Descriptors / Indicators: Aching;Sharp Pain Intervention(s): Limited activity within patient's tolerance;Repositioned;Ice applied;Patient requesting pain meds-RN notified     Hand Dominance Right   Extremity/Trunk Assessment Upper Extremity Assessment Upper Extremity Assessment: Generalized weakness   Lower Extremity Assessment Lower Extremity Assessment: Defer to PT evaluation   Cervical / Trunk Assessment Cervical / Trunk Assessment: Kyphotic   Communication Communication Communication: No difficulties   Cognition Arousal/Alertness: Awake/alert Behavior During Therapy: Anxious Overall Cognitive Status: Within Functional Limits for tasks assessed                     General Comments       Exercises  Shoulder Instructions      Home Living Family/patient expects to be discharged to:: Private residence Living Arrangements: Spouse/significant other Available Help at Discharge: Family Type of Home: House Home Access: Stairs to enter Secretary/administrator of Steps: 1 Entrance Stairs-Rails: None Home Layout: One level      Bathroom Shower/Tub: Producer, television/film/video: Standard     Home Equipment: Toilet riser;Cane - quad;Walker - 2 wheels;Shower seat - built in;Grab bars - tub/shower;Adaptive equipment Adaptive Equipment: Reacher Additional Comments: Also has lift chair      Prior Functioning/Environment Level of Independence: Independent with assistive device(s)        Comments: Pt reports he never wears socks, and he uses SPC to don shorts/pants .      OT Diagnosis: Generalized weakness;Acute pain   OT Problem List: Decreased strength;Decreased activity tolerance;Impaired balance (sitting and/or standing);Decreased safety awareness;Decreased knowledge of use of DME or AE;Obesity;Pain   OT Treatment/Interventions: Self-care/ADL training;DME and/or AE instruction;Therapeutic activities;Patient/family education    OT Goals(Current goals can be found in the care plan section) Acute Rehab OT Goals Patient Stated Goal: decrease pain  OT Goal Formulation: With patient Time For Goal Achievement: 08/23/14 Potential to Achieve Goals: Good ADL Goals Pt Will Perform Grooming: with min assist;standing Pt Will Perform Lower Body Bathing: with min assist;with adaptive equipment;sit to/from stand Pt Will Perform Lower Body Dressing: with min assist;with adaptive equipment;sit to/from stand Pt Will Transfer to Toilet: with min assist;ambulating;bedside commode;grab bars Pt Will Perform Toileting - Clothing Manipulation and hygiene: with min assist;sit to/from stand  OT Frequency: Min 2X/week   Barriers to D/C: Decreased caregiver support  doubtful that wife can provide necessary level of assistance        Co-evaluation PT/OT/SLP Co-Evaluation/Treatment: Yes Reason for Co-Treatment: For patient/therapist safety   OT goals addressed during session: ADL's and self-care      End of Session Equipment Utilized During Treatment: Rolling walker Nurse Communication: Mobility status;Patient  requests pain meds  Activity Tolerance: Patient limited by pain Patient left: in bed;with call bell/phone within reach   Time: 1234-1307 OT Time Calculation (min): 33 min Charges:  OT General Charges $OT Visit: 1 Procedure OT Evaluation $Initial OT Evaluation Tier I: 1 Procedure OT Treatments $Therapeutic Activity: 8-22 mins G-Codes:    Anthony Friedman M 19-Aug-2014, 1:33 PM

## 2014-08-10 LAB — CBC
HCT: 30 % — ABNORMAL LOW (ref 39.0–52.0)
Hemoglobin: 11 g/dL — ABNORMAL LOW (ref 13.0–17.0)
MCH: 35.8 pg — AB (ref 26.0–34.0)
MCHC: 36.7 g/dL — ABNORMAL HIGH (ref 30.0–36.0)
MCV: 97.7 fL (ref 78.0–100.0)
PLATELETS: 246 10*3/uL (ref 150–400)
RBC: 3.07 MIL/uL — ABNORMAL LOW (ref 4.22–5.81)
RDW: 14.8 % (ref 11.5–15.5)
WBC: 15.5 10*3/uL — ABNORMAL HIGH (ref 4.0–10.5)

## 2014-08-10 LAB — GLUCOSE, CAPILLARY
GLUCOSE-CAPILLARY: 127 mg/dL — AB (ref 65–99)
GLUCOSE-CAPILLARY: 89 mg/dL (ref 65–99)
Glucose-Capillary: 95 mg/dL (ref 65–99)
Glucose-Capillary: 78 mg/dL (ref 65–99)

## 2014-08-10 MED ORDER — SODIUM CHLORIDE 0.9 % IV BOLUS (SEPSIS)
400.0000 mL | Freq: Once | INTRAVENOUS | Status: AC
  Administered 2014-08-10: 400 mL via INTRAVENOUS

## 2014-08-10 NOTE — Clinical Social Work Note (Signed)
Clinical Social Work Assessment  Patient Details  Name: Anthony Friedman MRN: [id number] Date of Birth: 09-28-1950  Date of referral:  08/10/14               Reason for consult:  Facility Placement                Permission sought to share information with:  Facility Art therapist granted to share information::  Yes, Verbal Permission Granted  Name::        Agency::     Relationship::     Contact Information:     Housing/Transportation Living arrangements for the past 2 months:  Single Family Home Source of Information:  Patient Patient Interpreter Needed:  None Criminal Activity/Legal Involvement Pertinent to Current Situation/Hospitalization:    Significant Relationships:  Spouse Lives with:  Spouse Do you feel safe going back to the place where you live?    Need for family participation in patient care:  No (Coment)  Care giving concerns:  None reported   Facilities manager / plan:  CSW met with pt at bedside to discuss discharge needs.  CSW explained role of CSW and provided pt with information regarding placement in a SNF at discharge.  CSW prompted pt to discuss history, treatment services and current needs.  CSW also prompted pt to explore his thoughts and feelings related to his diagnoses and rehab. CSW will send pt information to SNF's in [place] area  Employment status:  Disabled (Comment on whether or not currently receiving Disability) Insurance information:  Managed Care PT Recommendations:  [place] / Referral to community resources:     Patient/Family's Response to care:  Pt discussed long history of surgery.  Pt stated he had both of his knees operated on and his back done twice.  Pt stated that is is disabled and lives at home with his wife who just retired. Pt stated that he was going home with PT services but he decided "she's (his wife) not going to be able to help me with what I need" so he has chosen to go to  a SNF at discharge.  Pt is hopeful that he can go to [facility] for rehab but will also look at other SNF's in the [place] area if he cannot get into [facility].  Patient/Family's Understanding of and Emotional Response to Diagnosis, Current Treatment, and Prognosis:  Pt discussed in detail his understanding of his diagnoses and rehab needs.  Pt not worried about rehab but states that he wants to tell his wife because she will not like that he will not be home with her  Emotional Assessment Appearance:  Appears stated age Attitude/Demeanor/Rapport:   (pleasant and cooperative) Affect (typically observed):  Appropriate Orientation:  Oriented to Self, Oriented to Place, Oriented to  Time, Oriented to Situation Alcohol / Substance use:    Psych involvement (Current and /or in the community):  No (Comment)  Discharge Needs  Concerns to be addressed:    Readmission within the last 30 days:  No Current discharge risk:    Barriers to Discharge:      Carlean Jews, LCSW 08/10/2014, 1:30 PM

## 2014-08-10 NOTE — Progress Notes (Signed)
Physical Therapy Treatment Patient Details Name: Anthony Friedman MRN: [id number] DOB: 1950/04/07 Today's Date: 08/10/2014    History of Present Illness R THR - hx  of RA, COPD, obesity, back surgery x 2 and Bil TKR    PT Comments    Progressing slowly with mobility. Remains limited by pain, decreased activity tolerance. Increased time to complete any and all tasks. Multiple rest breaks needed/taken during session. Will need SNF for continued rehab.   Follow Up Recommendations  SNF     Equipment Recommendations  Rolling walker with 5" wheels    Recommendations for Other Services OT consult     Precautions / Restrictions Precautions Precautions: Fall Restrictions Weight Bearing Restrictions: No Other Position/Activity Restrictions: WBAT    Mobility  Bed Mobility Overal bed mobility: Needs Assistance Bed Mobility: Supine to Sit;Sit to Supine     Supine to sit: Max assist;+2 for physical assistance;+2 for safety/equipment;HOB elevated Sit to supine: Max assist;+2 for physical assistance;+2 for safety/equipment   General bed mobility comments: step by step cues and assist to lift UB and to lift LEs onto and off of bed. Increased time.   Transfers Overall transfer level: Needs assistance Equipment used: Rolling walker (2 wheeled) Transfers: Sit to/from Stand Sit to Stand: From elevated surface;Max assist;+2 physical assistance;+2 safety/equipment         General transfer comment: max multimodal cues for safety, technqiue, hand placement, LE placement. Assist to rise, stabilize, control descent. Increased time. Sit to stand x2. Side steps x 4 towards HOB with RW  Ambulation/Gait             General Gait Details: Pt stated he was unable to ambulate on today due to back spasms   Stairs            Wheelchair Mobility    Modified Rankin (Stroke Patients Only)       Balance                                    Cognition Arousal/Alertness:  Awake/alert Behavior During Therapy: Anxious Overall Cognitive Status: Within Functional Limits for tasks assessed                      Exercises      General Comments        Pertinent Vitals/Pain Pain Assessment: 0-10 Pain Score: 8  Pain Location: back, R hip Pain Descriptors / Indicators: Aching;Sore;Stabbing Pain Intervention(s): Limited activity within patient's tolerance;RN gave pain meds during session;Ice applied;Repositioned    Home Living                      Prior Function            PT Goals (current goals can now be found in the care plan section) Progress towards PT goals: Progressing toward goals (very slowly-limited by pain, decreased activity tolerance)    Frequency  7X/week    PT Plan Current plan remains appropriate    Co-evaluation             End of Session   Activity Tolerance: Patient limited by fatigue;Patient limited by pain Patient left: in bed;with call bell/phone within reach;with family/visitor present     Time: 9735-3299 PT Time Calculation (min) (ACUTE ONLY): 59 min  Charges:  $Therapeutic Activity: 53-67 mins  G Codes:      Weston Anna, MPT Pager: [phone]

## 2014-08-10 NOTE — Progress Notes (Signed)
Patient refused urinary catheter to be taken out. Patient insisted that he would like to have it a little longer for the day.

## 2014-08-10 NOTE — Progress Notes (Signed)
Subjective: 2 Days Post-Op Procedure(s) (LRB): RIGHT TOTAL HIP ARTHROPLASTY ANTERIOR APPROACH (Right) Patient reports pain as severe.  Very limited mobility with therapy.  Foley still in due to morbid obesity also limiting mobility  Objective: Vital signs in last 24 hours: Temp:  [98.6 F (37 C)-100.6 F (38.1 C)] 98.6 F (37 C) (06/05 0440) Pulse Rate:  [64-78] 71 (06/05 0440) Resp:  [12-18] 16 (06/05 0440) BP: (103-127)/(50-60) 115/52 mmHg (06/05 0440) SpO2:  [84 %-100 %] 97 % (06/05 0440)  Intake/Output from previous day: 06/04 0701 - 06/05 0700 In: 360 [P.O.:360] Out: 1100 [Urine:1100] Intake/Output this shift: Total I/O In: 240 [P.O.:240] Out: -    Recent Labs  08/09/14 0445 08/10/14 0530  HGB 11.9* 11.0*    Recent Labs  08/09/14 0445 08/10/14 0530  WBC 11.7* 15.5*  RBC 3.31* 3.07*  HCT 32.8* 30.0*  PLT 270 246    Recent Labs  08/09/14 0445  NA 134*  K 4.2  CL 93*  CO2 33*  BUN 26*  CREATININE 1.02  GLUCOSE 121*  CALCIUM 8.9   No results for input(s): LABPT, INR in the last 72 hours.  Sensation intact distally Intact pulses distally Dorsiflexion/Plantar flexion intact Incision: scant drainage  Assessment/Plan: 2 Days Post-Op Procedure(s) (LRB): RIGHT TOTAL HIP ARTHROPLASTY ANTERIOR APPROACH (Right) Up with therapy Discharge to SNF  [facility] 08/10/2014, 10:11 AM

## 2014-08-10 NOTE — Progress Notes (Signed)
Pt with BP 83/42, HR 68, o2 sat 84. o2 per [place] increased to 3.5. Dr Ophelia Charter notified & order given for ivb. Artesha Wemhoff, [employer]

## 2014-08-11 ENCOUNTER — Encounter (HOSPITAL_COMMUNITY): Payer: Self-pay | Admitting: Orthopaedic Surgery

## 2014-08-11 LAB — GLUCOSE, CAPILLARY
GLUCOSE-CAPILLARY: 110 mg/dL — AB (ref 65–99)
Glucose-Capillary: 107 mg/dL — ABNORMAL HIGH (ref 65–99)
Glucose-Capillary: 109 mg/dL — ABNORMAL HIGH (ref 65–99)

## 2014-08-11 MED ORDER — DOXYCYCLINE HYCLATE 100 MG PO TABS: 100.0000 mg | ORAL_TABLET | Freq: Two times a day (BID) | ORAL | Status: DC

## 2014-08-11 MED ORDER — ASPIRIN 325 MG PO TBEC: 325.0000 mg | DELAYED_RELEASE_TABLET | Freq: Two times a day (BID) | ORAL | Status: DC

## 2014-08-11 MED ORDER — TIZANIDINE HCL 4 MG PO TABS: 4.0000 mg | ORAL_TABLET | Freq: Four times a day (QID) | ORAL | Status: DC | PRN

## 2014-08-11 MED ORDER — OXYCODONE HCL 5 MG PO TABS: 5.0000 mg | ORAL_TABLET | ORAL | Status: DC | PRN

## 2014-08-11 NOTE — Progress Notes (Signed)
Reported given to [place] at [facility].

## 2014-08-11 NOTE — Progress Notes (Signed)
Pt for discharge to [facility].   Discharge facilitated including contacting facility, faxing pt discharge information, providing RN phone number to call report, discussing with pt wife, confirming with [facility] that [facility] auth received, and arranging ambulance transport for pt to [facility].  No further social work needs identified at this time.  CSW signing off.   Loletta Specter, MSW, LCSW Clinical Social Work Coverage for [employer], [place] [phone]

## 2014-08-11 NOTE — Discharge Summary (Signed)
Patient ID: Anthony Friedman MRN: [id number] DOB/AGE: [birth year] 64 y.o.  Admit date: 08/08/2014 Discharge date: 08/11/2014  Admission Diagnoses:  Principal Problem:   Osteoarthritis of right hip Active Problems:   Status post total replacement of right hip   Discharge Diagnoses:  Same  Past Medical History  Diagnosis Date  . Chronic venous insufficiency     superficial  . Superficial thrombophlebitis     subacute-on anticoagulation  . Leg pain   . Edema     lower extremities  . Venous stasis     changes  . SOBOE (shortness of breath on exertion)   . RA (rheumatoid arthritis)     Dx 2005  . Liver enlargement     d/t arava  . Elevated liver enzymes     d/t arava  . Atelectasis   . Chronic allergic rhinitis   . Cough   . Obesity   . Hypertension   . Pallor   . Family history of adverse reaction to anesthesia     pts mother had difficulty with anesthesia - pt not sure what difficulties were   . COPD (chronic obstructive pulmonary disease)   . Pneumonia     hx of times 3; pt states is current with pneumonia vaccine  . Diabetes mellitus without complication   . Kidney stones     hx of   . Urinary frequency   . Urinary tract bacterial infections     hx of     Surgeries: Procedure(s): RIGHT TOTAL HIP ARTHROPLASTY ANTERIOR APPROACH on 08/08/2014   Consultants:    Discharged Condition: Improved  Hospital Course: Anthony Friedman is an 64 y.o. male who was admitted 08/08/2014 for operative treatment ofOsteoarthritis of right hip. [name], daily activities, and work/hobbies. After pre-op clearance the patient was taken to the operating room on 08/08/2014 and underwent  Procedure(s): RIGHT TOTAL HIP ARTHROPLASTY ANTERIOR APPROACH.    Patient was given perioperative antibiotics: Anti-infectives    Start     Dose/Rate Route Frequency Ordered Stop   08/11/14 0000  doxycycline (VIBRA-TABS) 100 MG tablet     100 mg Oral 2 times daily  08/11/14 0719     08/08/14 1400  ceFAZolin (ANCEF) IVPB 2 g/50 mL premix     2 g 100 mL/hr over 30 Minutes Intravenous Every 6 hours 08/08/14 1036 08/08/14 2133   08/08/14 0600  ceFAZolin (ANCEF) 3 g in dextrose 5 % 50 mL IVPB     3 g 160 mL/hr over 30 Minutes Intravenous On call to O.R. 08/07/14 1328 08/08/14 0735       Patient was given sequential compression devices, early ambulation, and chemoprophylaxis to prevent DVT.  Patient benefited maximally from hospital stay and there were no complications.    Recent vital signs: Patient Vitals for the past 24 hrs:  BP Temp Temp src Pulse Resp SpO2  08/11/14 0606 (!) 105/53 mmHg 98.6 F (37 C) Oral 73 16 96 %  08/10/14 2247 - - - - - 94 %  08/10/14 2241 121/64 mmHg (!) 100.9 F (38.3 C) Oral 73 16 94 %  08/10/14 1900 (!) 98/47 mmHg 99.6 F (37.6 C) Oral 84 16 94 %  08/10/14 1600 (!) 83/42 mmHg - - 68 16 96 %  08/10/14 1145 - - - - - 91 %     Recent laboratory studies:  Recent Labs  08/09/14 0445 08/10/14 0530  WBC 11.7* 15.5*  HGB 11.9* 11.0*  HCT 32.8* 30.0*  PLT 270 246  NA 134*  --   K 4.2  --   CL 93*  --   CO2 33*  --   BUN 26*  --   CREATININE 1.02  --   GLUCOSE 121*  --   CALCIUM 8.9  --      Discharge Medications:     Medication List    STOP taking these medications        aspirin 81 MG tablet  Replaced by:  aspirin 325 MG EC tablet     HYDROcodone-acetaminophen 10-325 MG per tablet  Commonly known as:  NORCO      TAKE these medications        aspirin 325 MG EC tablet  Take 1 tablet (325 mg total) by mouth 2 (two) times daily after a meal.     atenolol-chlorthalidone 100-25 MG per tablet  Commonly known as:  TENORETIC  Take 1 tablet by mouth every morning.     CALCIUM 1000 + D 1000-800 MG-UNIT Tabs  Generic drug:  Calcium Carb-Cholecalciferol  Take 800 mg by mouth every morning.     doxycycline 100 MG tablet  Commonly known as:  VIBRA-TABS  Take 1 tablet (100 mg total) by mouth 2 (two)  times daily.     FLOMAX 0.4 MG Caps capsule  Generic drug:  tamsulosin  Take 0.4 mg by mouth every morning.     folic acid 1 MG tablet  Commonly known as:  FOLVITE  Take 1 tablet by mouth every morning.     furosemide 20 MG tablet  Commonly known as:  LASIX  Take 20 mg by mouth every morning.     losartan 50 MG tablet  Commonly known as:  COZAAR  Take 1 tablet by mouth every morning.     metFORMIN 500 MG tablet  Commonly known as:  GLUCOPHAGE  Take 1 tablet by mouth 2 (two) times daily.     methotrexate 2.5 MG tablet  Commonly known as:  RHEUMATREX  Take 8 tablets by mouth once a week. Monday.     mometasone-formoterol 200-5 MCG/ACT Aero  Commonly known as:  DULERA  Inhale 2 puffs into the lungs 2 (two) times daily. INHALE 2 PUFFS INTO THE LUNGS once daily. USE ADVAIR WHEN NO SAMPLES OF DULERA AVAILABLE     multivitamin tablet  Take 1 tablet by mouth every morning.     Oxycodone HCl 10 MG Tabs  Take 10 mg by mouth every 8 (eight) hours as needed (for pain.).     oxyCODONE 5 MG immediate release tablet  Commonly known as:  Oxy IR/ROXICODONE  Take 1-3 tablets (5-15 mg total) by mouth every 3 (three) hours as needed for breakthrough pain.     PROAIR HFA 108 (90 BASE) MCG/ACT inhaler  Generic drug:  albuterol  Inhale 2 puffs into the lungs every 6 (six) hours as needed for wheezing.     rOPINIRole 1 MG tablet  Commonly known as:  REQUIP  Take 1 tablet by mouth daily as needed (restless leg.).     tiZANidine 4 MG tablet  Commonly known as:  ZANAFLEX  Take 1 tablet (4 mg total) by mouth every 6 (six) hours as needed for muscle spasms.     VICKS SINEX NA  Place 1-2 sprays into the nose daily as needed (congestion.).        Diagnostic Studies: Dg C-arm 61-120 Min-no Report  08/08/2014   CLINICAL DATA: stat   C-ARM 61-120 MINUTES  Fluoroscopy was utilized by the requesting physician.  No radiographic  interpretation.    Dg Hip Unilat With Pelvis 1v Right  08/08/2014    CLINICAL DATA:  Initial encounter for right hip replacement  EXAM: RIGHT HIP (WITH PELVIS) 1 VIEW  COMPARISON:  04/15/2014.  FINDINGS: 2 spot fluoro films are submitted after the procedure. Patient is status post right total hip replacement. No evidence for hardware complications on this frontal projection.  IMPRESSION: Negative.   Electronically Signed   By: Kennith Center M.D.   On: 08/08/2014 09:26   Dg Hip Port Unilat With Pelvis 1v Right  08/08/2014   CLINICAL DATA:  Initial encounter for right hip replacement  EXAM: RIGHT HIP (WITH PELVIS) 1 VIEW PORTABLE  COMPARISON:  Intraoperative films earlier the same day.  FINDINGS: Frontal and cross-table lateral views of the right hip show the patient be status post right total hip replacement. Femoral component is located within the acetabular cup. There is no evidence for periprosthetic fracture.  IMPRESSION: Status post right total hip replacement without complicating features.   Electronically Signed   By: Kennith Center M.D.   On: 08/08/2014 09:58    Disposition: to skilled nursing      Discharge Instructions    Discharge patient    Complete by:  As directed            Follow-up Information    Follow up with [facility].   Why:  HHPT   Contact information:   [address] [place] [address] [phone]       Follow up with Kathryne Hitch, MD In 2 weeks.   Specialty:  Orthopedic Surgery   Contact information:   [address] [place] [address] [phone]        Signed: Kathryne Hitch 08/11/2014, 7:21 AM

## 2014-08-11 NOTE — Progress Notes (Signed)
Physical Therapy Treatment Patient Details Name: Anthony Friedman MRN: [id number] DOB: 17-Nov-1950 Today's Date: 08/11/2014    History of Present Illness R THR - hx  of RA, COPD, obesity, back surgery x 2 and Bil TKR    PT Comments    Pt progressing slowly with mobility 2* obesity, deconditioning and c/o pain.  Noted improvement in pt participation with bed mobility and transfers.  Follow Up Recommendations  SNF     Equipment Recommendations  Rolling walker with 5" wheels    Recommendations for Other Services OT consult     Precautions / Restrictions Precautions Precautions: Fall Restrictions Weight Bearing Restrictions: No Other Position/Activity Restrictions: WBAT    Mobility  Bed Mobility Overal bed mobility: Needs Assistance Bed Mobility: Supine to Sit;Sit to Supine     Supine to sit: Mod assist;+2 for physical assistance Sit to supine: Mod assist;Max assist;+2 for physical assistance   General bed mobility comments: step by step cues and assist to lift UB and to lift LEs onto and off of bed. Increased time.   Transfers Overall transfer level: Needs assistance Equipment used: Rolling walker (2 wheeled) Transfers: Sit to/from Stand Sit to Stand: From elevated surface;Max assist;+2 physical assistance;+2 safety/equipment         General transfer comment: max multimodal cues for safety, technqiue, hand placement, LE placement. Assist to rise, stabilize, control descent. Increased time. Sit to stand x2.   Ambulation/Gait Ambulation/Gait assistance: +2 physical assistance;Mod assist Ambulation Distance (Feet): 1 Feet Assistive device: Rolling walker (2 wheeled) Gait Pattern/deviations: Step-to pattern;Decreased step length - right;Decreased step length - left;Shuffle;Trunk flexed Gait velocity: decrrrrreased   General Gait Details: Pt able to step fwd back 2 small steps each foot but declined to attempt further   Stairs            Wheelchair Mobility     Modified Rankin (Stroke Patients Only)       Balance                                    Cognition Arousal/Alertness: Awake/alert Behavior During Therapy: Anxious Overall Cognitive Status: Within Functional Limits for tasks assessed                      Exercises Total Joint Exercises Ankle Circles/Pumps: AROM;Both;15 reps;Supine Heel Slides: AAROM;Right;Supine;15 reps Hip ABduction/ADduction: AAROM;15 reps;Right;Supine    General Comments        Pertinent Vitals/Pain Pain Assessment: 0-10 Pain Score: 8  Pain Location: R hip Pain Descriptors / Indicators: Aching;Sore Pain Intervention(s): Limited activity within patient's tolerance;Premedicated before session;Monitored during session;Ice applied    Home Living                      Prior Function            PT Goals (current goals can now be found in the care plan section) Acute Rehab PT Goals Patient Stated Goal: decrease pain  PT Goal Formulation: With patient Time For Goal Achievement: 08/15/14 Potential to Achieve Goals: Fair Progress towards PT goals: Progressing toward goals    Frequency  7X/week    PT Plan Current plan remains appropriate    Co-evaluation             End of Session Equipment Utilized During Treatment: Gait belt Activity Tolerance: Patient limited by fatigue;Patient limited by pain Patient left: in bed;with call bell/phone within  reach;with family/visitor present     Time: 0932-6712 PT Time Calculation (min) (ACUTE ONLY): 49 min  Charges:  $Gait Training: 8-22 mins $Therapeutic Exercise: 8-22 mins $Therapeutic Activity: 8-22 mins                    G Codes:      Derold Dorsch 2014-08-21, 3:52 PM

## 2014-08-11 NOTE — Progress Notes (Signed)
Subjective: 3 Days Post-Op Procedure(s) (LRB): RIGHT TOTAL HIP ARTHROPLASTY ANTERIOR APPROACH (Right) Patient reports pain as severe.  Very slow mobility.  PT recommending short-term SNF.  Objective: Vital signs in last 24 hours: Temp:  [98.6 F (37 C)-100.9 F (38.3 C)] 98.6 F (37 C) (06/06 0606) Pulse Rate:  [68-84] 73 (06/06 0606) Resp:  [16] 16 (06/06 0606) BP: (83-121)/(42-64) 105/53 mmHg (06/06 0606) SpO2:  [91 %-96 %] 96 % (06/06 0606)  Intake/Output from previous day: 06/05 0701 - 06/06 0700 In: 240 [P.O.:240] Out: 825 [Urine:825] Intake/Output this shift:     Recent Labs  08/09/14 0445 08/10/14 0530  HGB 11.9* 11.0*    Recent Labs  08/09/14 0445 08/10/14 0530  WBC 11.7* 15.5*  RBC 3.31* 3.07*  HCT 32.8* 30.0*  PLT 270 246    Recent Labs  08/09/14 0445  NA 134*  K 4.2  CL 93*  CO2 33*  BUN 26*  CREATININE 1.02  GLUCOSE 121*  CALCIUM 8.9   No results for input(s): LABPT, INR in the last 72 hours.  Sensation intact distally Intact pulses distally Dorsiflexion/Plantar flexion intact Incision: scant drainage No cellulitis present Compartment soft  Assessment/Plan: 3 Days Post-Op Procedure(s) (LRB): RIGHT TOTAL HIP ARTHROPLASTY ANTERIOR APPROACH (Right) Up with therapy Discharge to SNF when bed available.  BLACKMAN,CHRISTOPHER Y 08/11/2014, 7:16 AM

## 2014-08-12 ENCOUNTER — Encounter: Payer: Self-pay | Admitting: Adult Health

## 2014-08-12 ENCOUNTER — Non-Acute Institutional Stay: Payer: Medicare HMO | Admitting: Adult Health

## 2014-08-12 DIAGNOSIS — G2581 Restless legs syndrome: Secondary | ICD-10-CM | POA: Diagnosis not present

## 2014-08-12 DIAGNOSIS — I87303 Chronic venous hypertension (idiopathic) without complications of bilateral lower extremity: Secondary | ICD-10-CM | POA: Diagnosis not present

## 2014-08-12 DIAGNOSIS — D62 Acute posthemorrhagic anemia: Secondary | ICD-10-CM | POA: Diagnosis not present

## 2014-08-12 DIAGNOSIS — D72829 Elevated white blood cell count, unspecified: Secondary | ICD-10-CM

## 2014-08-12 DIAGNOSIS — J449 Chronic obstructive pulmonary disease, unspecified: Secondary | ICD-10-CM | POA: Diagnosis not present

## 2014-08-12 DIAGNOSIS — I10 Essential (primary) hypertension: Secondary | ICD-10-CM | POA: Diagnosis not present

## 2014-08-12 DIAGNOSIS — E119 Type 2 diabetes mellitus without complications: Secondary | ICD-10-CM

## 2014-08-12 DIAGNOSIS — N400 Enlarged prostate without lower urinary tract symptoms: Secondary | ICD-10-CM | POA: Diagnosis not present

## 2014-08-12 DIAGNOSIS — M069 Rheumatoid arthritis, unspecified: Secondary | ICD-10-CM

## 2014-08-12 DIAGNOSIS — M1611 Unilateral primary osteoarthritis, right hip: Secondary | ICD-10-CM

## 2014-08-12 LAB — TYPE AND SCREEN
ABO/RH(D): AB POS
Antibody Screen: NEGATIVE
DAT, IGG: NEGATIVE
UNIT DIVISION: 0
Unit division: 0

## 2014-08-12 NOTE — Clinical Social Work Placement (Signed)
   CLINICAL SOCIAL WORK PLACEMENT  NOTE  Date:  08/12/2014  Patient Details  Name: Anthony Friedman MRN: [id number] Date of Birth: [birth year]  Clinical Social Work is seeking post-discharge placement for this patient at the Skilled  Nursing Facility level of care (*CSW will initial, date and re-position this form in  chart as items are completed):  Yes   Patient/family provided with [facility] Clinical Social Work Department's list of facilities offering this level of care within the geographic area requested by the patient (or if unable, by the patient's family).  Yes   Patient/family informed of their freedom to choose among providers that offer the needed level of care, that participate in Medicare, Medicaid or managed care program needed by the patient, have an available bed and are willing to accept the patient.  Yes   Patient/family informed of [facility]'s ownership interest in [facility] and [facility], as well as of the fact that they are under no obligation to receive care at these facilities.  PASRR submitted to EDS on 08/10/14     PASRR number received on 08/10/14     Existing PASRR number confirmed on       FL2 transmitted to all facilities in geographic area requested by pt/family on 08/10/14     FL2 transmitted to all facilities within larger geographic area on       Patient informed that his/her managed care company has contracts with or will negotiate with certain facilities, including the following:            Patient/family informed of bed offers received.  Patient chooses bed at [facility]     Physician recommends and patient chooses bed at      Patient to be transferred to [facility] on 08/11/14.  Patient to be transferred to facility by [facility]     Patient family notified on 08/12/14 of transfer.  Name of family member notified:  Spouse     PHYSICIAN       Additional Comment: Pt / spouse were in agreement with d/c to [facility] on 08/11/14.  [employer] provided authorization for SNF placement. [facility] transport was required.Pt is aware out of pocket may be associated with [facility] transport. NSGN reviewed d/c summary, scripts, avs. Scripts included in d/c packet.   _______________________________________________ Royetta Asal, LCSW 08/12/2014, 6:15 AM

## 2014-08-12 NOTE — Progress Notes (Signed)
Patient ID: Anthony Friedman, male   DOB: 26-Feb-1951, 64 y.o.   MRN: [id number]   08/12/2014  Facility:  Nursing Home Location:  [facility] Room Number: [id number] LEVEL OF CARE:  SNF (31)    Chief Complaint  Patient presents with  . Hospitalization Follow-up    Osteoarthritis S/P right total hip arthroplasty, hypertension, BPH, diabetes mellitus, RA, COPD, RLS, anemia and leukocytosis    HISTORY OF PRESENT ILLNESS:  This is a 64 year old male who was been admitted to [facility] on 08/11/14 from [facility] with osteoarthritis S/P right total hip arthroplasty done on 6/03. He has PMH of chronic venous insufficiency, RA, hypertension, chronic allergic rhinitis, diabetes mellitus and COPD  He was noted to be is sleepy in between our conversation. Patient reported that he just took pain medication.  He has been admitted for a short-term rehabilitation.  PAST MEDICAL HISTORY:  Past Medical History  Diagnosis Date  . Chronic venous insufficiency     superficial  . Superficial thrombophlebitis     subacute-on anticoagulation  . Leg pain   . Edema     lower extremities  . Venous stasis     changes  . SOBOE (shortness of breath on exertion)   . RA (rheumatoid arthritis)     Dx 2005  . Liver enlargement     d/t arava  . Elevated liver enzymes     d/t arava  . Atelectasis   . Chronic allergic rhinitis   . Cough   . Obesity   . Hypertension   . Pallor   . Family history of adverse reaction to anesthesia     pts mother had difficulty with anesthesia - pt not sure what difficulties were   . COPD (chronic obstructive pulmonary disease)   . Pneumonia     hx of times 3; pt states is current with pneumonia vaccine  . Diabetes mellitus without complication   . Kidney stones     hx of   . Urinary frequency   . Urinary tract bacterial infections     hx of     CURRENT MEDICATIONS: Reviewed per MAR/see medication list  Allergies  Allergen Reactions   . Sulfa Antibiotics     Blood in urine     REVIEW OF SYSTEMS:  GENERAL: no change in appetite, no fatigue, no weight changes, no fever, chills or weakness RESPIRATORY: no cough, SOB, DOE, wheezing, hemoptysis CARDIAC: no chest pain, or palpitations GI: no abdominal pain, diarrhea, constipation, heart burn, nausea or vomiting  PHYSICAL EXAMINATION  GENERAL: no acute distress, obese  SKIN:  Right hip surgical incision is covered with aquacel dressing, no erythema, dry EYES: conjunctivae normal, sclerae normal, normal eye lids NECK: supple, trachea midline, no neck masses, no thyroid tenderness, no thyromegaly LYMPHATICS: no LAN in the neck, no supraclavicular LAN RESPIRATORY: breathing is even & unlabored, BS CTAB CARDIAC: RRR, no murmur,no extra heart sounds, RLE edema 1+ GI: abdomen soft, normal BS, no masses, no tenderness, no hepatomegaly, no splenomegaly EXTREMITIES: Able 4 extremities PSYCHIATRIC: the patient is alert & oriented to person, affect & behavior appropriate  LABS/RADIOLOGY: Labs reviewed: Basic Metabolic Panel:  Recent Labs  40/81/44 0925 08/09/14 0445  NA 136 134*  K 4.4 4.2  CL 93* 93*  CO2 35* 33*  GLUCOSE 104* 121*  BUN 30* 26*  CREATININE 0.93 1.02  CALCIUM 9.2 8.9   Liver Function Tests:  Recent Labs  08/05/14 0925  AST 22  ALT 23  ALKPHOS 90  BILITOT 0.7  PROT 7.2  ALBUMIN 3.9   CBC:  Recent Labs  08/05/14 0925 08/09/14 0445 08/10/14 0530  WBC 10.3 11.7* 15.5*  HGB 12.8* 11.9* 11.0*  HCT 38.5* 32.8* 30.0*  MCV 95.3 99.1 97.7  PLT 266 270 246   CBG:  Recent Labs  08/11/14 0055 08/11/14 0719 08/11/14 1221  GLUCAP 107* 109* 110*    Dg C-arm 61-120 Min-no Report  08/08/2014   CLINICAL DATA: stat   C-ARM 61-120 MINUTES  Fluoroscopy was utilized by the requesting physician.  No radiographic  interpretation.    Dg Hip Unilat With Pelvis 1v Right  08/08/2014   CLINICAL DATA:  Initial encounter for right hip replacement   EXAM: RIGHT HIP (WITH PELVIS) 1 VIEW  COMPARISON:  04/15/2014.  FINDINGS: 2 spot fluoro films are submitted after the procedure. Patient is status post right total hip replacement. No evidence for hardware complications on this frontal projection.  IMPRESSION: Negative.   Electronically Signed   By: Kennith Center M.D.   On: 08/08/2014 09:26   Dg Hip Port Unilat With Pelvis 1v Right  08/08/2014   CLINICAL DATA:  Initial encounter for right hip replacement  EXAM: RIGHT HIP (WITH PELVIS) 1 VIEW PORTABLE  COMPARISON:  Intraoperative films earlier the same day.  FINDINGS: Frontal and cross-table lateral views of the right hip show the patient be status post right total hip replacement. Femoral component is located within the acetabular cup. There is no evidence for periprosthetic fracture.  IMPRESSION: Status post right total hip replacement without complicating features.   Electronically Signed   By: Kennith Center M.D.   On: 08/08/2014 09:58    ASSESSMENT/PLAN:  Osteoarthritis    S/P right total hip arthroplasty - for rehabilitation; continue aspirin 325 mg 1 tab by mouth twice a day for DVT prophylaxis; decrease OxyIR 5 mg 1-2 tabs by mouth every 3 hours when necessary for pain; Zanaflex 4 mg 1 tab by mouth every 6 hours when necessary for muscle spasm; and follow-up with Dr. Magnus Ivan, orthopedic surgeon, in 2 weeks Hypertension - continue Tenoretic 100-25 mg 1 tab by mouth every morning and Lasix 20 mg 1 tab by mouth daily BPH - continue Flomax 0.4 mg by mouth every morning Diabetes mellitus, type II - continue metformin 500 mg 1 tab by mouth twice a day RA - continue methotrexate 2.5 mg 8 tabs by mouth every Mondays COPD - continue duo I2 100-500 g/ACT inhaled 2 puffs into lungs twice a day Restless leg syndrome - continue reck with 1 mg 1 tab by mouth daily when necessary Anemia, acute blood loss - hemoglobin 11.0; will monitor Leukocytosis - wbc 15.5; continue doxycycline 100 mg 1 tab by mouth  twice a day until follow-up with orthopedic surgeon   Edema of lower extremity - continue Lasix 20 mg 1 tab PO Q D    Goals of care:  Short-term rehabilitation   Labs/test ordered:  CBC, BMP and HgbA1c  Spent 50 minutes in patient care.    [facility], NP [employer] [phone]

## 2014-08-13 LAB — BASIC METABOLIC PANEL
BUN: 66 mg/dL — AB (ref 4–21)
Creatinine: 1.5 mg/dL — AB (ref 0.6–1.3)
GLUCOSE: 87 mg/dL
POTASSIUM: 5.4 mmol/L — AB (ref 3.4–5.3)
Sodium: 130 mmol/L — AB (ref 137–147)

## 2014-08-13 LAB — CBC AND DIFFERENTIAL
HCT: 28 % — AB (ref 41–53)
Hemoglobin: 9.5 g/dL — AB (ref 13.5–17.5)
PLATELETS: 302 10*3/uL (ref 150–399)
WBC: 6.9 10*3/mL

## 2014-08-13 LAB — HEMOGLOBIN A1C: Hgb A1c MFr Bld: 5.7 % (ref 4.0–6.0)

## 2014-08-15 ENCOUNTER — Encounter: Payer: Self-pay | Admitting: Internal Medicine

## 2014-08-15 ENCOUNTER — Non-Acute Institutional Stay (SKILLED_NURSING_FACILITY): Payer: Medicare HMO | Admitting: Internal Medicine

## 2014-08-15 DIAGNOSIS — E875 Hyperkalemia: Secondary | ICD-10-CM | POA: Diagnosis not present

## 2014-08-15 DIAGNOSIS — D72829 Elevated white blood cell count, unspecified: Secondary | ICD-10-CM

## 2014-08-15 DIAGNOSIS — E119 Type 2 diabetes mellitus without complications: Secondary | ICD-10-CM | POA: Diagnosis not present

## 2014-08-15 DIAGNOSIS — K5900 Constipation, unspecified: Secondary | ICD-10-CM | POA: Diagnosis not present

## 2014-08-15 DIAGNOSIS — M1611 Unilateral primary osteoarthritis, right hip: Secondary | ICD-10-CM

## 2014-08-15 DIAGNOSIS — D62 Acute posthemorrhagic anemia: Secondary | ICD-10-CM

## 2014-08-15 DIAGNOSIS — I10 Essential (primary) hypertension: Secondary | ICD-10-CM | POA: Diagnosis not present

## 2014-08-15 DIAGNOSIS — M069 Rheumatoid arthritis, unspecified: Secondary | ICD-10-CM | POA: Diagnosis not present

## 2014-08-15 DIAGNOSIS — N400 Enlarged prostate without lower urinary tract symptoms: Secondary | ICD-10-CM

## 2014-08-15 DIAGNOSIS — R600 Localized edema: Secondary | ICD-10-CM

## 2014-08-15 DIAGNOSIS — N179 Acute kidney failure, unspecified: Secondary | ICD-10-CM

## 2014-08-15 NOTE — Progress Notes (Signed)
Patient ID: Anthony Friedman, male   DOB: 1950/06/20, 64 y.o.   MRN: [id number]     [facility]  PCP: Smothers, Cathleen Corti, NP  Code Status: Full Code   Allergies  Allergen Reactions  . Sulfa Antibiotics     Blood in urine    Chief Complaint  Patient presents with  . New Admit To SNF    New Admission      HPI:  64 year old patient is here for short term rehabilitation post hospital admission from 08/08/14-08/11/14 with osteoarthritis of right hip. He underwent right total hip arthroplasty. He has PMH of chronic venous insufficiency, RA, hypertension, chronic allergic rhinitis, diabetes mellitus and COPD. He is seen in his room today. His pain is under control but has muscle spasm. He has not had a good bowel movement and mentions dulcolax helped him at home. Does not like food her, has been getting it from outside.    Review of Systems:  Constitutional: Negative for fever, chills, diaphoresis.  HENT: Negative for headache, congestion, nasal discharge Eyes: Negative for eye pain, blurred vision, double vision and discharge.  Respiratory: Negative for cough, shortness of breath and wheezing.   Cardiovascular: Negative for chest pain, palpitations Gastrointestinal: Negative for heartburn, nausea, vomiting, abdominal pain Genitourinary: Negative for dysuria Musculoskeletal: Negative for back pain, falls Skin: Negative for itching, rash.  Neurological: Negative for dizziness, tingling, focal weakness Psychiatric/Behavioral: Negative for depression.    Past Medical History  Diagnosis Date  . Chronic venous insufficiency     superficial  . Superficial thrombophlebitis     subacute-on anticoagulation  . Leg pain   . Edema     lower extremities  . Venous stasis     changes  . SOBOE (shortness of breath on exertion)   . RA (rheumatoid arthritis)     Dx 2005  . Liver enlargement     d/t arava  . Elevated liver enzymes     d/t arava  . Atelectasis   . Chronic  allergic rhinitis   . Cough   . Obesity   . Hypertension   . Pallor   . Family history of adverse reaction to anesthesia     pts mother had difficulty with anesthesia - pt not sure what difficulties were   . COPD (chronic obstructive pulmonary disease)   . Pneumonia     hx of times 3; pt states is current with pneumonia vaccine  . Diabetes mellitus without complication   . Kidney stones     hx of   . Urinary frequency   . Urinary tract bacterial infections     hx of    Past Surgical History  Procedure Laterality Date  . Hand surgery  1965    right hand  . Knee surgery      arthroscopic right and left knee  . Other surgical history      polynidal cyst  . Umbilical hernia repair    . Laminectomy  1988    and fusion; L4, L5, S1  . Laminectomy  2007    and fusion L1, L2, L3  . Replacement total knee  2009    left 2009; right 2014  . Spinal cord stimulator implant      to control back pain   . Total hip arthroplasty Right 08/08/2014    Procedure: RIGHT TOTAL HIP ARTHROPLASTY ANTERIOR APPROACH;  Surgeon: Kathryne Hitch, MD;  Location: [facility]S;  Service: Orthopedics;  Laterality: Right;  Social History:   reports that he quit smoking about 17 months ago. His smoking use included Cigarettes. He has a 90 pack-year smoking history. He has never used smokeless tobacco. He reports that he does not drink alcohol or use illicit drugs.  Family History  Problem Relation Age of Onset  . Heart attack Father   . Esophageal cancer Mother   . Other Mother     Tachycardia  . Diabetes Brother     Medications: Patient's Medications  New Prescriptions   No medications on file  Previous Medications   ALBUTEROL (PROAIR HFA) 108 (90 BASE) MCG/ACT INHALER    Inhale 2 puffs into the lungs every 6 (six) hours as needed for wheezing.   ASPIRIN EC 325 MG EC TABLET    Take 1 tablet (325 mg total) by mouth 2 (two) times daily after a meal.   ATENOLOL-CHLORTHALIDONE (TENORETIC) 100-25 MG  PER TABLET    Take 1 tablet by mouth every morning.    CALCIUM CARB-CHOLECALCIFEROL (CALCIUM 1000 + D) 1000-800 MG-UNIT TABS    Take 1,000 mg by mouth every morning.    DOXYCYCLINE (VIBRA-TABS) 100 MG TABLET    Take 1 tablet (100 mg total) by mouth 2 (two) times daily.   FOLIC ACID (FOLVITE) 1 MG TABLET    Take 1 tablet by mouth every morning.    FUROSEMIDE (LASIX) 20 MG TABLET    Take 20 mg by mouth every morning.    LOSARTAN (COZAAR) 50 MG TABLET    Take 1 tablet by mouth every morning.    METFORMIN (GLUCOPHAGE) 500 MG TABLET    Take 1 tablet by mouth 2 (two) times daily.   METHOTREXATE (RHEUMATREX) 2.5 MG TABLET    Take 8 tablets by mouth once a week. Monday.   MULTIPLE VITAMIN (MULTIVITAMIN) TABLET    Take 1 tablet by mouth every morning.    ROPINIROLE (REQUIP) 1 MG TABLET    Take 1 tablet by mouth daily as needed (restless leg.).    SODIUM CHLORIDE (OCEAN) 0.65 % SOLN NASAL SPRAY    Place 1 spray into both nostrils as needed for congestion (For Rhinitis).   TAMSULOSIN HCL (FLOMAX) 0.4 MG CAPS    Take 0.4 mg by mouth every morning.    TIZANIDINE (ZANAFLEX) 4 MG TABLET    Take 1 tablet (4 mg total) by mouth every 6 (six) hours as needed for muscle spasms.  Modified Medications   Modified Medication Previous Medication   OXYCODONE (OXY IR/ROXICODONE) 5 MG IMMEDIATE RELEASE TABLET oxyCODONE (OXY IR/ROXICODONE) 5 MG immediate release tablet      1 by mouth every 3 hours as needed for mild to moderate pain, 2 by mouth every 3 hours as needed for severe pain    Take 1-3 tablets (5-15 mg total) by mouth every 3 (three) hours as needed for breakthrough pain.  Discontinued Medications   MOMETASONE-FORMOTEROL (DULERA) 200-5 MCG/ACT AERO    Inhale 2 puffs into the lungs 2 (two) times daily. INHALE 2 PUFFS INTO THE LUNGS once daily. USE ADVAIR WHEN NO SAMPLES OF DULERA AVAILABLE   OXYCODONE HCL 10 MG TABS    Take 10 mg by mouth every 8 (eight) hours as needed (for pain.).    PHENYLEPHRINE HCL (VICKS  SINEX NA)    Place 1-2 sprays into the nose daily as needed (congestion.).     Physical Exam: Filed Vitals:   08/15/14 0936  BP: 114/62  Pulse: 74  Temp: 98.3 F (36.8 C)  TempSrc: Oral  Resp: 18  Height: 6' (1.829 m)  Weight: 339 lb (153.769 kg)  SpO2: 93%    General- elderly male, obese, in no acute distress Head- normocephalic, atraumatic Throat- moist mucus membrane Eyes- PERRLA, EOMI, no pallor, no icterus, no discharge, normal conjunctiva, normal sclera Neck- no cervical lymphadenopathy Cardiovascular- normal s1,s2, no murmurs, palpable dorsalis pedis and radial pulses, 1+ right leg edema Respiratory- bilateral clear to auscultation, no wheeze, no rhonchi, no crackles, no use of accessory muscles Abdomen- bowel sounds present, soft, non tender Musculoskeletal- able to move all 4 extremities, generalized weakness Neurological- no focal deficit Skin- warm and dry, right hip has aquacel dressing Psychiatry- alert and oriented to person, place and time, normal mood and affect    Labs reviewed: Basic Metabolic Panel:  Recent Labs  10/93/23 0925 08/09/14 0445 08/13/14 1426  NA 136 134* 130*  K 4.4 4.2 5.4*  CL 93* 93*  --   CO2 35* 33*  --   GLUCOSE 104* 121*  --   BUN 30* 26* 66*  CREATININE 0.93 1.02 1.5*  CALCIUM 9.2 8.9  --    Liver Function Tests:  Recent Labs  08/05/14 0925  AST 22  ALT 23  ALKPHOS 90  BILITOT 0.7  PROT 7.2  ALBUMIN 3.9   No results for input(s): LIPASE, AMYLASE in the last 8760 hours. No results for input(s): AMMONIA in the last 8760 hours. CBC:  Recent Labs  08/05/14 0925 08/09/14 0445 08/10/14 0530 08/13/14 1426  WBC 10.3 11.7* 15.5* 6.9  HGB 12.8* 11.9* 11.0* 9.5*  HCT 38.5* 32.8* 30.0* 28*  MCV 95.3 99.1 97.7  --   PLT 266 270 246 302   Cardiac Enzymes: No results for input(s): CKTOTAL, CKMB, CKMBINDEX, TROPONINI in the last 8760 hours. BNP: Invalid input(s): POCBNP CBG:  Recent Labs  08/11/14 0055  08/11/14 0719 08/11/14 1221  GLUCAP 107* 109* 110*   Lab Results  Component Value Date   HGBA1C 5.7 08/13/2014    Assessment/Plan  Right hip OA S/P right total hip arthroplasty. Will have him work with physical therapy and occupational therapy team to help with gait training and muscle strengthening exercises.fall precautions. Skin care. Encourage to be out of bed. Unclear reason for being on doxycyline. Wbc is normal on lab check in facility but was elevated at discharge. Also has hx of RA and is on methotrexate which would immunosuppress him. continue doxycycline for now until orthopedic follow up. Continue aspirin 325 mg bid for DVT prophylaxis. Continue ca-vitd. Continue OxyIR 5 mg 1-2 tabs by mouth every 3 hours when necessary for pain. on Zanaflex 4 mg q6h prn muscle spasm. Change this to 4 mg bid with q8h prn. Has f/u with Dr. Magnus Ivan.  Anemia, acute blood loss Hb 9.5, monitor h&h  Leukocytosis Improved. continue doxycycline 100 mg bid until orthopedic follow up, monitor for fever   Hyperkalemia k 5.4. His losartan and chlorthalidone could be contributing some.  Acute renal failure Possible iatrogenic, metformin on hold for now. If recheck shows worsening of renal function, will hold off losartan  Constipation Start colace 100 mg po bid and dulcolax 5 mg daily, hydration encouraged  Hypertension Stable. continue losartan 50 mg daily and tenoretic 100-25 daily with Lasix 20 mg daily. Monitor bp  Edema of lower extremity continue Lasix 20 mg daily  BPH Stable continue Flomax 0.4 mg daily  Diabetes mellitus, type II on metformin 500 mg bid but given acute renal impairment holding it for now,  continue cbg check. Pending bmp recheck 08/18/14   RA continue methotrexate 2.5 mg q8h once a week   Goals of care: short term rehabilitation   Labs/tests ordered: cbc with diff, bmp 08/18/14  Family/ staff Communication: reviewed care plan with patient and nursing  supervisor    Oneal Grout, MD  [facility] [phone] (Monday-Friday 8 am - 5 pm) [phone] (afterhours)

## 2014-08-19 ENCOUNTER — Telehealth: Payer: Self-pay | Admitting: Critical Care Medicine

## 2014-08-19 ENCOUNTER — Encounter: Payer: Self-pay | Admitting: Adult Health

## 2014-08-19 ENCOUNTER — Non-Acute Institutional Stay: Payer: Medicare HMO | Admitting: Adult Health

## 2014-08-19 DIAGNOSIS — M1611 Unilateral primary osteoarthritis, right hip: Secondary | ICD-10-CM

## 2014-08-19 DIAGNOSIS — N400 Enlarged prostate without lower urinary tract symptoms: Secondary | ICD-10-CM | POA: Diagnosis not present

## 2014-08-19 DIAGNOSIS — J449 Chronic obstructive pulmonary disease, unspecified: Secondary | ICD-10-CM

## 2014-08-19 DIAGNOSIS — I87303 Chronic venous hypertension (idiopathic) without complications of bilateral lower extremity: Secondary | ICD-10-CM | POA: Diagnosis not present

## 2014-08-19 DIAGNOSIS — K5900 Constipation, unspecified: Secondary | ICD-10-CM

## 2014-08-19 DIAGNOSIS — D62 Acute posthemorrhagic anemia: Secondary | ICD-10-CM | POA: Diagnosis not present

## 2014-08-19 DIAGNOSIS — E119 Type 2 diabetes mellitus without complications: Secondary | ICD-10-CM | POA: Diagnosis not present

## 2014-08-19 DIAGNOSIS — I10 Essential (primary) hypertension: Secondary | ICD-10-CM

## 2014-08-19 DIAGNOSIS — M069 Rheumatoid arthritis, unspecified: Secondary | ICD-10-CM | POA: Diagnosis not present

## 2014-08-19 DIAGNOSIS — D72829 Elevated white blood cell count, unspecified: Secondary | ICD-10-CM | POA: Diagnosis not present

## 2014-08-19 DIAGNOSIS — G2581 Restless legs syndrome: Secondary | ICD-10-CM

## 2014-08-19 NOTE — Telephone Encounter (Signed)
Checked sample closet, we have no samples of the Dulera 200  Patient notified.   Patient says he will check back with [place] in a couple of weeks to see if we get any in. Nothing further needed.

## 2014-08-19 NOTE — Progress Notes (Signed)
Patient ID: Anthony Friedman, male   DOB: 06-27-1950, 64 y.o.   MRN: [id number]   08/19/2014  Facility:  Nursing Home Location:  [facility] Room Number: 908-P LEVEL OF CARE:  SNF (31)   Chief Complaint  Patient presents with  . Discharge Note    Osteoarthritis S/P right total hip arthroplasty, hypertension, BPH, diabetes mellitus, RA, COPD, RLS, anemia, constipation, leukocytosis and edema    HISTORY OF PRESENT ILLNESS:  This is a 64 year old male who is for discharge home with Home health PT, OT and CNA. DME: Bariatric bedside commode . He has been admitted to [facility] on 08/11/14 from [facility] with osteoarthritis S/P right total hip arthroplasty done on 6/03. He has PMH of chronic venous insufficiency, RA, hypertension, chronic allergic rhinitis, diabetes mellitus and COPD  Patient was admitted to this facility for short-term rehabilitation after the patient's recent hospitalization.  Patient has completed SNF rehabilitation and therapy has cleared the patient for discharge.  PAST MEDICAL HISTORY:  Past Medical History  Diagnosis Date  . Chronic venous insufficiency     superficial  . Superficial thrombophlebitis     subacute-on anticoagulation  . Leg pain   . Edema     lower extremities  . Venous stasis     changes  . SOBOE (shortness of breath on exertion)   . RA (rheumatoid arthritis)     Dx 2005  . Liver enlargement     d/t arava  . Elevated liver enzymes     d/t arava  . Atelectasis   . Chronic allergic rhinitis   . Cough   . Obesity   . Hypertension   . Pallor   . Family history of adverse reaction to anesthesia     pts mother had difficulty with anesthesia - pt not sure what difficulties were   . COPD (chronic obstructive pulmonary disease)   . Pneumonia     hx of times 3; pt states is current with pneumonia vaccine  . Diabetes mellitus without complication   . Kidney stones     hx of   . Urinary frequency   .  Urinary tract bacterial infections     hx of     CURRENT MEDICATIONS: Reviewed per MAR/see medication list  Allergies  Allergen Reactions  . Sulfa Antibiotics     Blood in urine    REVIEW OF SYSTEMS:  GENERAL: no change in appetite, no fatigue, no weight changes, no fever, chills or weakness RESPIRATORY: no cough, SOB, DOE, wheezing, hemoptysis CARDIAC: no chest pain, or palpitations GI: no abdominal pain, diarrhea, constipation, heart burn, nausea or vomiting  PHYSICAL EXAMINATION  GENERAL: no acute distress, obese  SKIN:  Right hip surgical incision is covered with aquacel dressing, no erythema, dry NECK: supple, trachea midline, no neck masses, no thyroid tenderness, no thyromegaly LYMPHATICS: no LAN in the neck, no supraclavicular LAN RESPIRATORY: breathing is even & unlabored, BS CTAB CARDIAC: RRR, no murmur,no extra heart sounds, RLE edema 1+ GI: abdomen soft, normal BS, no masses, no tenderness, no hepatomegaly, no splenomegaly EXTREMITIES: Able 4 extremities PSYCHIATRIC: the patient is alert & oriented to person, affect & behavior appropriate  LABS/RADIOLOGY: Labs reviewed: 08/18/14  WBC 9.7 hemoglobin 11.2 hematocrit 31.4 MCV 92.6 sodium 136 potassium 4.9 glucose 93 BUN 26 creatinine 1.01 calcium 9.1 08/13/14  WBC 6.9 hemoglobin 9.5 hematocrit 28.0 MCV 91.5 sodium 130 potassium 5.4 glucose 87 BUN 66 creatinine 1.53 calcium 8.6 hemoglobin A1c 5.7 Basic  Metabolic Panel:  Recent Labs  86/76/19 0925 08/09/14 0445 08/13/14 1426  NA 136 134* 130*  K 4.4 4.2 5.4*  CL 93* 93*  --   CO2 35* 33*  --   GLUCOSE 104* 121*  --   BUN 30* 26* 66*  CREATININE 0.93 1.02 1.5*  CALCIUM 9.2 8.9  --    Liver Function Tests:  Recent Labs  08/05/14 0925  AST 22  ALT 23  ALKPHOS 90  BILITOT 0.7  PROT 7.2  ALBUMIN 3.9   CBC:  Recent Labs  08/05/14 0925 08/09/14 0445 08/10/14 0530 08/13/14 1426  WBC 10.3 11.7* 15.5* 6.9  HGB 12.8* 11.9* 11.0* 9.5*  HCT 38.5*  32.8* 30.0* 28*  MCV 95.3 99.1 97.7  --   PLT 266 270 246 302   CBG:  Recent Labs  08/11/14 0055 08/11/14 0719 08/11/14 1221  GLUCAP 107* 109* 110*    Dg C-arm 61-120 Min-no Report  08/08/2014   CLINICAL DATA: stat   C-ARM 61-120 MINUTES  Fluoroscopy was utilized by the requesting physician.  No radiographic  interpretation.    Dg Hip Unilat With Pelvis 1v Right  08/08/2014   CLINICAL DATA:  Initial encounter for right hip replacement  EXAM: RIGHT HIP (WITH PELVIS) 1 VIEW  COMPARISON:  04/15/2014.  FINDINGS: 2 spot fluoro films are submitted after the procedure. Patient is status post right total hip replacement. No evidence for hardware complications on this frontal projection.  IMPRESSION: Negative.   Electronically Signed   By: Kennith Center M.D.   On: 08/08/2014 09:26   Dg Hip Port Unilat With Pelvis 1v Right  08/08/2014   CLINICAL DATA:  Initial encounter for right hip replacement  EXAM: RIGHT HIP (WITH PELVIS) 1 VIEW PORTABLE  COMPARISON:  Intraoperative films earlier the same day.  FINDINGS: Frontal and cross-table lateral views of the right hip show the patient be status post right total hip replacement. Femoral component is located within the acetabular cup. There is no evidence for periprosthetic fracture.  IMPRESSION: Status post right total hip replacement without complicating features.   Electronically Signed   By: Kennith Center M.D.   On: 08/08/2014 09:58    ASSESSMENT/PLAN:  Osteoarthritis S/P right total hip arthroplasty - for home health PT, OT and CNA; continue aspirin 325 mg 1 tab by mouth twice a day for DVT prophylaxis; decrease OxyIR 5 mg 1-2 tabs by mouth every 3 hours when necessary for pain; Zanaflex 4 mg 1 tab by mouth every 6 hours when necessary for muscle spasm; and follow-up with Dr. Magnus Ivan, orthopedic surgeon Hypertension - continue Tenoretic 100-25 mg 1 tab by mouth every morning and Lasix 20 mg 1 tab by mouth daily BPH - continue Flomax 0.4 mg by mouth  every morning Diabetes mellitus, type II - hgbA1c 5.7 ; continue metformin 500 mg 1 tab by mouth twice a day; latest creatinine (6/13) 1.01 RA - continue methotrexate 2.5 mg 8 tabs by mouth every Mondays COPD - continue albuterol HFA inhale 2 puffs into lungs Q 6 hours PRN Restless leg syndrome - continue Requip 1 mg 1 tab by mouth daily when necessary Anemia, acute blood loss - hemoglobin 11.2; continue FeSO4 325 mg 1 tabPO Q D Leukocytosis - wbc 15.5; now (6/13) 9.7 ; continue doxycycline 100 mg 1 tab by mouth twice a day until follow-up with orthopedic surgeon   Edema of lower extremity - continue Lasix 20 mg 1 tab PO Q OD Constipation - continue Dulcolax 5  mg by mouth daily and Colace 100 mg by mouth twice a day   I have filled out patient's discharge paperwork and written prescriptions.  Patient will receive home health PT, OT and CNA.  DME provided:  Bariatric bedside commode  Total discharge time: Greater than 30 minutes  Discharge time involved coordination of the discharge process with social worker, nursing staff and therapy department. Medical justification for home health services/DME verified.     [facility], NP [employer] [phone]

## 2014-09-15 ENCOUNTER — Telehealth: Payer: Self-pay | Admitting: Critical Care Medicine

## 2014-09-15 NOTE — Telephone Encounter (Signed)
Pt is requesting samples of Dulera 200. He is due for a ROV with PW. This has been scheduled for 10/22/14 at 9am at the [place] office. Samples will be left at the front desk for pick up. Nothing further was needed at this time.

## 2014-10-22 ENCOUNTER — Ambulatory Visit (INDEPENDENT_AMBULATORY_CARE_PROVIDER_SITE_OTHER): Payer: Medicare HMO | Admitting: Critical Care Medicine

## 2014-10-22 ENCOUNTER — Encounter: Payer: Self-pay | Admitting: Critical Care Medicine

## 2014-10-22 VITALS — BP 110/72 | HR 55 | Temp 98.1°F | Ht 69.0 in | Wt 335.0 lb

## 2014-10-22 DIAGNOSIS — J449 Chronic obstructive pulmonary disease, unspecified: Secondary | ICD-10-CM | POA: Diagnosis not present

## 2014-10-22 DIAGNOSIS — Z23 Encounter for immunization: Secondary | ICD-10-CM | POA: Diagnosis not present

## 2014-10-22 MED ORDER — MOMETASONE FURO-FORMOTEROL FUM 200-5 MCG/ACT IN AERO: 2.0000 | INHALATION_SPRAY | Freq: Two times a day (BID) | RESPIRATORY_TRACT | Status: DC

## 2014-10-22 NOTE — Patient Instructions (Signed)
Prevnar 13 given No change in medications Return 6 months [place] office tammy parrett

## 2014-10-22 NOTE — Progress Notes (Signed)
   Subjective:    Patient ID: Anthony Friedman, male    DOB: [birth year], 64 y.o.   MRN: [id number]  HPI 10/22/2014 Chief Complaint  Patient presents with  . Follow-up    DOE at baseline.  No cough, chest tightness, CP, or wheezing.    OVerall doing well.  No new issues.   Pt denies any significant sore throat, nasal congestion or excess secretions, fever, chills, sweats, unintended weight loss, pleurtic or exertional chest pain, orthopnea PND, or leg swelling Pt denies any increase in rescue therapy over baseline, denies waking up needing it or having any early am or nocturnal exacerbations of coughing/wheezing/or dyspnea. Pt also denies any obvious fluctuation in symptoms with  weather or environmental change or other alleviating or aggravating factors   Current Medications, Allergies, Complete Past Medical History, Past Surgical History, Family History, and Social History were reviewed in [employer] electronic medical record per todays encounter:  10/22/2014   Review of Systems  Constitutional: Negative.   HENT: Negative.  Negative for ear pain, postnasal drip, rhinorrhea, sinus pressure, sore throat, trouble swallowing and voice change.   Eyes: Negative.   Respiratory: Positive for shortness of breath. Negative for apnea, cough, choking, chest tightness, wheezing and stridor.   Cardiovascular: Negative.  Negative for chest pain, palpitations and leg swelling.  Gastrointestinal: Negative.  Negative for nausea, vomiting, abdominal pain and abdominal distention.  Genitourinary: Negative.   Musculoskeletal: Negative.  Negative for myalgias and arthralgias.  Skin: Negative.  Negative for rash.  Allergic/Immunologic: Negative.  Negative for environmental allergies and food allergies.  Neurological: Negative.  Negative for dizziness, syncope, weakness and headaches.  Hematological: Negative.  Negative for adenopathy. Does not bruise/bleed easily.  Psychiatric/Behavioral: Negative.  Negative  for sleep disturbance and agitation. The patient is not nervous/anxious.        Objective:   Physical Exam Filed Vitals:   10/22/14 0914  BP: 110/72  Pulse: 55  Temp: 98.1 F (36.7 C)  TempSrc: Oral  Height: 5\' 9"  (1.753 m)  Weight: 335 lb (151.955 kg)  SpO2: 95%    Gen: Pleasant, obese, in no distress,  normal affect  ENT: No lesions,  mouth clear,  oropharynx clear, no postnasal drip  Neck: No JVD, no TMG, no carotid bruits  Lungs: No use of accessory muscles, no dullness to percussion, distant BS  Cardiovascular: RRR, heart sounds normal, no murmur or gallops, no peripheral edema  Abdomen: soft and NT, no HSM,  BS normal  Musculoskeletal: No deformities, no cyanosis or clubbing  Neuro: alert, non focal  Skin: Warm, no lesions or rashes  No results found.       Assessment & Plan:  I personally reviewed all images and lab data in the [facility] system as well as any outside material available during this office visit and agree with the  radiology impressions.   COPD gold stage C. Copd Gold Stage C moderate airflow obstruction Stable at present Plan Prevnar 13 given No change in medications Return 6 months [place] office tammy parrett     Creg was seen today for follow-up.  Diagnoses and all orders for this visit:  COPD with chronic bronchitis  Need for prophylactic vaccination against Streptococcus pneumoniae (pneumococcus) -     Pneumococcal conjugate vaccine 13-valent IM (Prevnar)  COPD gold stage C.  Other orders -     mometasone-formoterol (DULERA) 200-5 MCG/ACT AERO; Inhale 2 puffs into the lungs 2 (two) times daily.

## 2014-10-24 NOTE — Assessment & Plan Note (Signed)
Copd Gold Stage C moderate airflow obstruction Stable at present Plan Prevnar 13 given No change in medications Return 6 months [place] office tammy parrett

## 2014-12-15 ENCOUNTER — Telehealth: Payer: Self-pay | Admitting: Critical Care Medicine

## 2014-12-15 NOTE — Telephone Encounter (Signed)
Samples have been left at the front desk. Pt is aware. Nothing further was needed. 

## 2015-01-22 ENCOUNTER — Telehealth: Payer: Self-pay | Admitting: Emergency Medicine

## 2015-01-22 ENCOUNTER — Telehealth: Payer: Self-pay | Admitting: Critical Care Medicine

## 2015-01-22 NOTE — Telephone Encounter (Signed)
Called and spoke to pt. Pt is requesting a letter clearing pt for bariatric surgery. Advised pt we will need to make an appt. Pt stated he has one in 04/2015 and will wait till then and if he needs to make a sooner appt he will call back. Nothing further needed at this time.

## 2015-01-22 NOTE — Telephone Encounter (Signed)
Opened under wrong physician. Will close.  SJJ

## 2015-02-16 ENCOUNTER — Telehealth: Payer: Self-pay | Admitting: Critical Care Medicine

## 2015-02-16 NOTE — Telephone Encounter (Signed)
Samples have been left up front for pick up. Attempted to call pt. Line was busy. Will try back.

## 2015-02-18 NOTE — Telephone Encounter (Signed)
Called spoke with pt. Made aware of below. Nothing further needed 

## 2015-04-16 ENCOUNTER — Encounter: Payer: Self-pay | Admitting: Adult Health

## 2015-04-16 ENCOUNTER — Ambulatory Visit (HOSPITAL_BASED_OUTPATIENT_CLINIC_OR_DEPARTMENT_OTHER)
Admission: RE | Admit: 2015-04-16 | Discharge: 2015-04-16 | Disposition: A | Discharge status: Home / Self Care | Payer: Medicare HMO | Source: Ambulatory Visit | Attending: Adult Health | Admitting: Adult Health

## 2015-04-16 ENCOUNTER — Ambulatory Visit (INDEPENDENT_AMBULATORY_CARE_PROVIDER_SITE_OTHER): Payer: Medicare HMO | Admitting: Adult Health

## 2015-04-16 VITALS — BP 108/70 | HR 64 | Temp 98.0°F | Ht 69.0 in | Wt 346.0 lb

## 2015-04-16 DIAGNOSIS — J449 Chronic obstructive pulmonary disease, unspecified: Secondary | ICD-10-CM

## 2015-04-16 DIAGNOSIS — Z01818 Encounter for other preprocedural examination: Secondary | ICD-10-CM | POA: Diagnosis not present

## 2015-04-16 DIAGNOSIS — R918 Other nonspecific abnormal finding of lung field: Secondary | ICD-10-CM | POA: Diagnosis not present

## 2015-04-16 DIAGNOSIS — J984 Other disorders of lung: Secondary | ICD-10-CM | POA: Diagnosis not present

## 2015-04-16 MED ORDER — MOMETASONE FURO-FORMOTEROL FUM 100-5 MCG/ACT IN AERO: 2.0000 | INHALATION_SPRAY | Freq: Two times a day (BID) | RESPIRATORY_TRACT | Status: DC

## 2015-04-16 NOTE — Patient Instructions (Addendum)
Continue on Dulera 2 puffs Twice daily  , rinse after use.  Chest xray today .  Work on weight loss.  Saline nasal rinses and gel.  Zyrtec 10mg  At bedtime  As needed  Drainage.  Follow up with Dr. in 6 months and As needed

## 2015-04-16 NOTE — Assessment & Plan Note (Signed)
Pt plans for Bariatric surgery in May this year. -Gastric sleeve with general anesthesia.  Pre op evaluation requested He has multiple co-morbidities including Moderate /Severe COPD that is not Oxygen dependent -he has a more restrictive patter most likely made worse with his morbid obesity.  He is a moderate risk from a pulmonary standpoint. We reviewed the potential complication risk  Would mobilize pt as soon as able , with pulmonary hygiene, DVT prophylaxis.  He does have OSA followed by neurology and will need to be on CPAP as well.   [facility] Postperative Pulmonary Risk Score Comment Score  Type of surgery - abd ao aneurysm (27), thoracic (21), neurosurgery / upper abdominal / vascular (21), neck (11) Bariatric/gastric sleeve  16  Emergency Surgery - (11) elective 0  ALbumin < 3 or poor nutritional state - (9) Unknown  0   BUN > 30 -  (8) nml 2016  0   Partial or completely dependent functional status - (7) independent 0  COPD -  (6) COPD  6   Age - 60 to 11 (4), > 70  (6) 64  4  TOTAL    Risk Stratifcation scores  - < 10, 11-19, 20-27, 28-40, >40  26      We discussed he is a moderate risk . And went over these factors and recommendations.    Major Pulmonary risks identified in the multifactorial risk analysis are but not limited to a) pneumonia; b) recurrent intubation risk; c) prolonged or recurrent acute respiratory failure needing mechanical ventilation; d) prolonged hospitalization; e) DVT/Pulmonary embolism; f) Acute Pulmonary edema  Recommend 1. Short duration of surgery as much as possible and avoid paralytic if possible 2. Recovery in step down or ICU with Pulmonary consultation 3. DVT prophylaxis 4. Aggressive pulmonary toilet with o2, bronchodilatation, and incentive spirometry and early ambulation 4. Use of CPAP .

## 2015-04-16 NOTE — Progress Notes (Signed)
Subjective:    Patient ID: Anthony Friedman, male    DOB: 1950-07-13, 65 y.o.   MRN: [id number]  HPI 65 yo morbidly obese former smoker (quit 03/2013) with COPD -GOLD C  Has RA on Methotrexate.  Has HTN and DM.   04/16/2015 Follow up : COPD  Pt returns for 6 month follow up .  He is maintained on [facility] however is too expensive.  Tried to get patient assistance for [facility], did not qualify .  Can not afford. Insurance will not cover.  Tried an appeal with the insurance , they denied.  Depends on samples.  Has tried several different inhaler including symbicort and Advair with no perceived benefit.   Says he is some cough on/off mainly to clear throat . Feels drainage and mucus get stuck.  No discolored mucus, fever, chest pain, orthopnea, or edema.   Is planning for Bariatric surgery in May. Need surgical clearance today.  Planned with Dr. Maretta Bees at [facility] in [place].  Plans for gastric sleeve. Plans for general anesthesia.  Has Sleep Apnea on OSA. Followed by Neurology, Dr. Anne Hahn in [facility] .  Plans to change from CPAP to BIPAP .  Needs chest xray today.  He is independent, drives and cares for self.  Does use cane with multiple joint issues over the years.  Had right hip replacement in 08/2014 , did well with surgery per pt.    Spirometry today shows FEV1 42%, ratio 72, FVC 45%.  Spirometry 10/25/2012: FEV1 51% predicted FVC 49% predicted FEV1 FVC ratio 82% predicted FEF 25-75 63% predicted CT chest 02/2015 seen on chart review showed a RLL PNA, neg for PE.     [facility] Postperative Pulmonary Risk Score Comment Score  Type of surgery - abd ao aneurysm (27), thoracic (21), neurosurgery / upper abdominal / vascular (21), neck (11) Bariatric/gastric sleeve  16  Emergency Surgery - (11) elective 0  ALbumin < 3 or poor nutritional state - (9) Unknown  0   BUN > 30 -  (8) nml 2016  0   Partial or completely dependent functional status - (7) independent 0  COPD -  (6) COPD   6   Age - 60 to 27 (4), > 70  (6) 64  4  TOTAL    Risk Stratifcation scores  - < 10, 11-19, 20-27, 28-40, >40  26      We discussed he is a moderate risk . And went over these factors and recommendations.    Major Pulmonary risks identified in the multifactorial risk analysis are but not limited to a) pneumonia; b) recurrent intubation risk; c) prolonged or recurrent acute respiratory failure needing mechanical ventilation; d) prolonged hospitalization; e) DVT/Pulmonary embolism; f) Acute Pulmonary edema  Recommend 1. Short duration of surgery as much as possible and avoid paralytic if possible 2. Recovery in step down or ICU with Pulmonary consultation 3. DVT prophylaxis 4. Aggressive pulmonary toilet with o2, bronchodilatation, and incentive spirometry and early ambulation 4. Use of CPAP .     Says he had PNA in Dec, resolved with abx, says he had no follow up cxr.    Review of Systems Constitutional:   No  weight loss, night sweats,  Fevers, chills, fatigue, or  lassitude.  HEENT:   No headaches,  Difficulty swallowing,  Tooth/dental problems, or  Sore throat,                No sneezing, itching, ear ache,  +nasal congestion, post nasal  drip,   CV:  No chest pain,  Orthopnea, PND, swelling in lower extremities, anasarca, dizziness, palpitations, syncope.   GI  No heartburn, indigestion, abdominal pain, nausea, vomiting, diarrhea, change in bowel habits, loss of appetite, bloody stools.   Resp:    No chest wall deformity  Skin: no rash or lesions.  GU: no dysuria, change in color of urine, no urgency or frequency.  No flank pain, no hematuria   MS:  No joint pain or swelling.  No decreased range of motion.  No back pain.  Psych:  No change in mood or affect. No depression or anxiety.  No memory loss.         Objective:   Physical Exam Filed Vitals:   04/16/15 0938  BP: 108/70  Pulse: 64  Temp: 98 F (36.7 C)  TempSrc: Oral  Height: 5\' 9"  (1.753 m)  Weight:  346 lb (156.945 kg)  SpO2: 91%   GEN: A/Ox3; pleasant , NAD, morbidly obese   HEENT:  [place]/AT,  EACs-clear, TMs-wnl, NOSE-clear, THROAT-clear, no lesions, no postnasal drip or exudate noted. Class 2-3 MP airway   NECK:  Supple w/ fair ROM; no JVD; normal carotid impulses w/o bruits; no thyromegaly or nodules palpated; no lymphadenopathy.  RESP  Clear  P & A; w/o, wheezes/ rales/ or rhonchi.no accessory muscle use, no dullness to percussion  CARD:  RRR, no m/r/g  ,tr -1 + peripheral edema, pulses intact, no cyanosis or clubbing.  GI:   Soft & nt; nml bowel sounds; no organomegaly or masses detected.  Musco: Warm bil, no deformities or joint swelling noted.  Walks with 2 canes  Neuro: alert, no focal deficits noted.    Skin: Warm, no lesions or rashes         Assessment & Plan:

## 2015-04-16 NOTE — Assessment & Plan Note (Signed)
Compensated on present regimen  Spirometry does show a slight decrease with more restrictive pattern ? Obesity related.  Comparison is to previous full PFTs.  Clinically he appears stable with activity levels.  Has some mild Allergic rhinitis symptoms Cont with dulera for now w/ samples  Check cxr today -recent PNA in DEC    Plan  Continue on Dulera 2 puffs Twice daily  , rinse after use.  Chest xray today .  Work on weight loss.  Saline nasal rinses and gel.  Zyrtec 10mg  At bedtime  As needed  Drainage.  Follow up with Dr. in 6 months and As needed

## 2015-04-20 ENCOUNTER — Ambulatory Visit: Payer: Medicare HMO | Admitting: Adult Health

## 2015-04-21 ENCOUNTER — Telehealth: Payer: Self-pay | Admitting: Adult Health

## 2015-04-21 NOTE — Progress Notes (Signed)
Quick Note:  LVM for pt to return call.  Need to verify surgeon Will call back ______

## 2015-04-21 NOTE — Telephone Encounter (Signed)
Notes Recorded by Julio Sicks, NP on 04/20/2015 at 10:31 PM Similar to CT and CXR in Dec  Right basilar atx Will cont to watch on cxr  Please send to surgeon at [facility].  Called and spoke with pt. Reviewed results and recs. Verified surgeon as Dr. Adolphus Birchwood. Pt voiced understanding and had no further questions. Results faxed to [phone]. Nothing further needed.

## 2015-04-21 NOTE — Progress Notes (Signed)
Reviewed & agree with plan  

## 2015-04-21 NOTE — Progress Notes (Signed)
Quick Note:  Called and spoke with pt. Reviewed results and recs. Verified surgeon as Dr. Adolphus Birchwood. Pt voiced understanding and had no further questions. Results faxed to [phone]. Nothing further needed. ______

## 2015-07-17 ENCOUNTER — Telehealth: Payer: Self-pay | Admitting: Adult Health

## 2015-07-17 NOTE — Telephone Encounter (Signed)
lmtcb x1 for pt. 

## 2015-07-20 ENCOUNTER — Telehealth: Payer: Self-pay | Admitting: Pulmonary Disease

## 2015-07-20 NOTE — Telephone Encounter (Signed)
Spoke with pt and confirmed Dulera 200. Sample at front desk. Pt to pick up. Nothing further needed.

## 2015-07-20 NOTE — Telephone Encounter (Signed)
LMTCB  Chart has Dulera 100 & Dulera 200. Please confirm strength.

## 2015-07-20 NOTE — Telephone Encounter (Signed)
Error pt sched.[name]

## 2015-07-20 NOTE — Telephone Encounter (Signed)
Pt cb, states he uses dulera 200 but if we dont have 200 he can use 100, [phone]

## 2015-07-21 NOTE — Telephone Encounter (Signed)
Spoke with pt, states that this has been handled by his other office, nothing further is needed.

## 2015-07-30 ENCOUNTER — Other Ambulatory Visit: Payer: Self-pay | Admitting: Orthopaedic Surgery

## 2015-07-30 DIAGNOSIS — M25552 Pain in left hip: Secondary | ICD-10-CM

## 2015-08-06 ENCOUNTER — Ambulatory Visit
Admission: RE | Admit: 2015-08-06 | Discharge: 2015-08-06 | Disposition: A | Discharge status: Home / Self Care | Payer: Medicare HMO | Source: Ambulatory Visit | Attending: Orthopaedic Surgery | Admitting: Orthopaedic Surgery

## 2015-08-06 DIAGNOSIS — M25552 Pain in left hip: Secondary | ICD-10-CM

## 2015-08-06 MED ORDER — METHYLPREDNISOLONE ACETATE 40 MG/ML INJ SUSP (RADIOLOG
120.0000 mg | Freq: Once | INTRAMUSCULAR | Status: AC
  Administered 2015-08-06: 120 mg via INTRA_ARTICULAR

## 2015-08-06 MED ORDER — IOPAMIDOL (ISOVUE-M 200) INJECTION 41%
1.0000 mL | Freq: Once | INTRAMUSCULAR | Status: AC
Start: 2015-08-06 — End: 2015-08-06
  Administered 2015-08-06: 1 mL via INTRA_ARTICULAR

## 2015-08-18 ENCOUNTER — Ambulatory Visit (INDEPENDENT_AMBULATORY_CARE_PROVIDER_SITE_OTHER): Payer: Medicare HMO | Admitting: Pulmonary Disease

## 2015-08-18 ENCOUNTER — Encounter: Payer: Self-pay | Admitting: Pulmonary Disease

## 2015-08-18 VITALS — BP 128/78 | HR 69 | Ht 68.0 in | Wt 336.6 lb

## 2015-08-18 DIAGNOSIS — J449 Chronic obstructive pulmonary disease, unspecified: Secondary | ICD-10-CM

## 2015-08-18 NOTE — Patient Instructions (Signed)
Keep taking your medicines as you are doing On the day of surgery: Use Dulera Out of bed as soon possible Use the incentive spirometer We will be available to see you if needed in the hospital Follow up with me in 12/2015

## 2015-08-18 NOTE — Assessment & Plan Note (Signed)
Despite having "severe airflow obstruction" from his COPD based on this year's simple spirometry test, the biggest limitation for his respiratory status is his obesity.  He has very prominent restrictive lung disease that makes it difficult to assess his airflow obstruction.  He does not have COPD exacerbations and his diffusion capacity in 2012 wsa relatively well preserved.  Plan: OK to proceed with Bariatric surgery Take Dulera the day of the surgery Use duoneb prn Out of bed ASAP Incentive spirometry F/u 4 months

## 2015-08-18 NOTE — Progress Notes (Signed)
Subjective:    Patient ID: Anthony Friedman, male    DOB: 03-15-1950, 65 y.o.   MRN: [id number]  Synopsis: Former patient of Dr. Delford Field listed as having COPD who first established care with [facility] in 08/2015. Spirometry 10/25/2012: FEV1 51% predicted FVC 49% predicted FEV1 FVC ratio 82% predicted FEF 25-75   63% predicted Spirometry 04/2015 > no airflow obstruction, Ratio 72%, FEV1 1.48 42%, FVC 45% He quit smoking after 40 years of smoking as much as 2ppd in 2012, he briefly smoked again in 2015.    HPI Chief Complaint  Patient presents with  . Follow-up    PW pt being treated for COPD- pt c/o sob with exertion, no other complaints.  Pt states this is stable for him at this time.     Kennen [facility]sa previously followed by my partner for COPD.    Recently he has been evaluated for bariatric surgery and he was told that he needed to have a his surgery in a center with an ICU.  He is now being considered for bariatric surgery at [facility].  Dr. Ezzard Standing has evaluated him for surgery.  He tolerated hip surgery from Delynn Flavin a year ago.    He has never been hospitalized for a respiratory problem.  He has had a couple of episodes of increased dyspnea after surgery and he used albuterol which helped.  He hasn't had bronchitis or COPD exacerbations that he is aware of.  He reports having pneumonia a few times, but not recently.  Dyspnea> hasn't worsened at all lately  Obstructive sleep apnea> reports very high compliance with BIPAP, he feels rested during the daytime  Past Medical History  Diagnosis Date  . Chronic venous insufficiency     superficial  . Superficial thrombophlebitis     subacute-on anticoagulation  . Leg pain   . Edema     lower extremities  . Venous stasis     changes  . SOBOE (shortness of breath on exertion)   . RA (rheumatoid arthritis) (HCC)     Dx 2005  . Liver enlargement     d/t arava  . Elevated liver enzymes     d/t arava  . Atelectasis   . Chronic  allergic rhinitis   . Cough   . Obesity   . Hypertension   . Pallor   . Family history of adverse reaction to anesthesia     pts mother had difficulty with anesthesia - pt not sure what difficulties were   . COPD (chronic obstructive pulmonary disease) (HCC)   . Pneumonia     hx of times 3; pt states is current with pneumonia vaccine  . Diabetes mellitus without complication (HCC)   . Kidney stones     hx of   . Urinary frequency   . Urinary tract bacterial infections     hx of       Review of Systems  Constitutional: Negative for fever, chills and fatigue.  HENT: Negative for postnasal drip, rhinorrhea and sinus pressure.   Respiratory: Positive for shortness of breath. Negative for cough and wheezing.   Cardiovascular: Negative for chest pain, palpitations and leg swelling.       Objective:   Physical Exam Filed Vitals:   08/18/15 1512  BP: 128/78  Pulse: 69  Height: 5\' 8"  (1.727 m)  Weight: 336 lb 9.6 oz (152.681 kg)  SpO2: 93%   RA  Gen: morbidly obese, no acute distress HENT: NCAT, OP clear, neck supple without  masses Eyes: PERRL, EOMi Lymph: no cervical lymphadenopathy PULM: Diminished R base, otherwise clear CV: RRR, no mgr, no JVD GI: BS+, soft, nontender, no hsm Derm: no rash or skin breakdown MSK: normal bulk and tone Neuro: A&Ox4, CN II-XII intact, strength 5/5 in all 4 extremities Psyche: normal mood and affect  2.2017 CXR > elevated R hemidiaphragm with atelectasis, no infiltrate Records from Dr. Lynelle Doctor last visit for COPD reviewed    Assessment & Plan:  COPD gold stage C. Despite having "severe airflow obstruction" from his COPD based on this year's simple spirometry test, the biggest limitation for his respiratory status is his obesity.  He has very prominent restrictive lung disease that makes it difficult to assess his airflow obstruction.  He does not have COPD exacerbations and his diffusion capacity in 2012 wsa relatively well  preserved.  Plan: OK to proceed with Bariatric surgery Take Dulera the day of the surgery Use duoneb prn Out of bed ASAP Incentive spirometry F/u 4 months  > 50% of this visit spent face to face in a 40 minute visit.   Current outpatient prescriptions:  .  albuterol (PROAIR HFA) 108 (90 BASE) MCG/ACT inhaler, Inhale 2 puffs into the lungs every 6 (six) hours as needed for wheezing., Disp: , Rfl:  .  aspirin 81 MG tablet, Take 81 mg by mouth daily., Disp: , Rfl:  .  atenolol-chlorthalidone (TENORETIC) 100-25 MG per tablet, Take 1 tablet by mouth every morning. , Disp: , Rfl:  .  Calcium Carb-Cholecalciferol (CALCIUM 1000 + D) 1000-800 MG-UNIT TABS, Take 1,800 mg by mouth every morning. , Disp: , Rfl:  .  clotrimazole-betamethasone (LOTRISONE) cream, Apply topically as needed., Disp: , Rfl: 3 .  folic acid (FOLVITE) 1 MG tablet, Take 1 tablet by mouth every morning. , Disp: , Rfl:  .  losartan (COZAAR) 50 MG tablet, Take 1 tablet by mouth every morning. , Disp: , Rfl:  .  metFORMIN (GLUCOPHAGE) 500 MG tablet, Take 1 tablet by mouth 2 (two) times daily., Disp: , Rfl:  .  Methotrexate, PF, 20 MG/0.4ML SOAJ, Inject into the skin once a week., Disp: , Rfl:  .  mometasone-formoterol (DULERA) 200-5 MCG/ACT AERO, Inhale 2 puffs into the lungs 2 (two) times daily., Disp: 2 Inhaler, Rfl: 0 .  Multiple Vitamin (MULTIVITAMIN) tablet, Take 1 tablet by mouth every morning. , Disp: , Rfl:  .  oxyCODONE-acetaminophen (PERCOCET/ROXICET) 5-325 MG per tablet, Take 1-2 tablets by mouth at bedtime as needed., Disp: , Rfl:  .  rOPINIRole (REQUIP) 1 MG tablet, Take 1 tablet by mouth daily as needed (restless leg.). , Disp: , Rfl:  .  sodium chloride (OCEAN) 0.65 % SOLN nasal spray, Place 1 spray into both nostrils as needed for congestion (For Rhinitis). Reported on 04/16/2015, Disp: , Rfl:  .  Tamsulosin HCl (FLOMAX) 0.4 MG CAPS, Take 0.4 mg by mouth every morning. , Disp: , Rfl:  .  furosemide (LASIX) 20 MG  tablet, Take 20 mg by mouth every morning. , Disp: , Rfl:  .  mometasone-formoterol (DULERA) 100-5 MCG/ACT AERO, Inhale 2 puffs into the lungs 2 (two) times daily., Disp: 2 Inhaler, Rfl: 0

## 2015-09-01 ENCOUNTER — Ambulatory Visit: Payer: Medicare HMO | Admitting: Skilled Nursing Facility1

## 2015-09-09 ENCOUNTER — Encounter: Payer: Medicare HMO | Attending: Surgery | Admitting: Dietician

## 2015-09-09 ENCOUNTER — Encounter: Payer: Self-pay | Admitting: Dietician

## 2015-09-09 DIAGNOSIS — J449 Chronic obstructive pulmonary disease, unspecified: Secondary | ICD-10-CM | POA: Insufficient documentation

## 2015-09-09 DIAGNOSIS — I10 Essential (primary) hypertension: Secondary | ICD-10-CM | POA: Insufficient documentation

## 2015-09-09 DIAGNOSIS — E119 Type 2 diabetes mellitus without complications: Secondary | ICD-10-CM | POA: Insufficient documentation

## 2015-09-09 DIAGNOSIS — Z731 Type A behavior pattern: Secondary | ICD-10-CM | POA: Insufficient documentation

## 2015-09-09 NOTE — Progress Notes (Signed)
  Pre-Op Assessment Visit:  Pre-Operative Sleeve Gastrectomy Surgery  Medical Nutrition Therapy:  Appt start time: 0945   End time:  1030.  Patient was seen on 09/09/2015 for Pre-Operative Nutrition Assessment. Assessment and letter of approval faxed to [facility] Surgery Bariatric Surgery Program coordinator on 09/09/2015.   Preferred Learning Style:   No preference indicated   Learning Readiness:   Ready  Handouts given during visit include:  Pre-Op Goals Bariatric Surgery Protein Shakes   During the appointment today the following Pre-Op Goals were reviewed with the patient: Maintain or lose weight as instructed by your surgeon Make healthy food choices Begin to limit portion sizes Limited concentrated sugars and fried foods Keep fat/sugar in the single digits per serving on   food labels Practice CHEWING your food  (aim for 30 chews per bite or until applesauce consistency) Practice not drinking 15 minutes before, during, and 30 minutes after each meal/snack Avoid all carbonated beverages  Avoid/limit caffeinated beverages  Avoid all sugar-sweetened beverages Consume 3 meals per day; eat every 3-5 hours Make a list of non-food related activities Aim for 64-100 ounces of FLUID daily  Aim for at least 60-80 grams of PROTEIN daily Look for a liquid protein source that contain ?15 g protein and ?5 g carbohydrate  (ex: shakes, drinks, shots)   Demonstrated degree of understanding via:  Teach Back  Teaching Method Utilized:  Visual Auditory Hands on  Barriers to learning/adherence to lifestyle change: none  Patient to call the [facility] to enroll in Pre-Op and Post-Op Nutrition Education when surgery date is scheduled.

## 2015-09-09 NOTE — Patient Instructions (Signed)
Follow Pre-Op Goals Try Protein Shakes Call [facility] at [phone] when surgery is scheduled to enroll in Pre-Op Class  Things to remember:  Please always be honest with us. We want to support you!  If you have any questions or concerns in between appointments, please call or email Liz, Leslie, or Laurie.  The diet after surgery will be high protein and low in carbohydrate.  Vitamins and calcium need to be taken for the rest of your life.  Feel free to include support people in any classes or appointments.   Supplement recommendations:  Complete" Multivitamin: Sleeve Gastrectomy and RYGB patients take a double dose of MVI. LAGB patients take single dose as it is written on the package. Vitamin must be liquid or chewable but not gummy. Examples of these include Flintstones Complete and Centrum Complete. If the vitamin is bariatric-specific, take 1 dose as it is already formulated for bariatric surgery patients. Examples of these are Bariatric Advantage, Celebrate, and Wellesse. These can be found at the [facility] and/or online.     Calcium citrate: 1500 mg/day of Calcium citrate (also chewable or liquid) is recommended for all procedures. The body is only able to absorb 500-600 mg of Calcium at one time so 3 daily doses of 500 mg are recommended. Calcium doses must be taken a minimum of 2 hours apart. Additionally, Calcium must be taken 2 hours apart from iron-containing MVI. Examples of brands include Celebrate, Bariatric Advantage, and Wellesse. These brands must be purchased online or at the [facility]. Citracal Petites is the only Calcium citrate supplement found in general grocery stores and pharmacies. This is in tablet form and may be recommended for patients who do not tolerate chewable Calcium.  Continued or added Vitamin D supplementation based on individual needs.    Vitamin B12: 300-500 mcg/day for Sleeve Gastrectomy and RYGB. Optional for  LAGB patients as stomach remains fully intact. Must be taken intramuscularly, sublingually, or inhaled nasally. Oral route is not recommended. 

## 2015-09-21 ENCOUNTER — Ambulatory Visit: Payer: Medicare HMO

## 2015-09-29 ENCOUNTER — Other Ambulatory Visit (HOSPITAL_COMMUNITY): Payer: Self-pay | Admitting: Surgery

## 2015-10-05 NOTE — Progress Notes (Signed)
  Pre-Operative Nutrition Class:  Appt start time: 830   End time:  930.  Patient was seen on 10/05/2015 for Pre-Operative Bariatric Surgery Education at the [facility].   Surgery date:  Surgery type: sleeve gastrectomy Start weight at [facility]: 336.5 lbs on 09/09/2015 Weight today: scale error  TANITA  BODY COMP RESULTS  10/05/15   BMI (kg/m^2) N/A   Fat Mass (lbs)    Fat Free Mass (lbs)    Total Body Water (lbs)    Samples given per MNT protocol. Patient educated on appropriate usage: Premier protein shake (qty 1- vanilla) Lot #: [id number] Exp: 05/2016  Bariatric Advantage Calcium Citrate chew (caramel - qty 1) Lot #: [id number] Exp: 01/2016  Renee Pain Protein Powder (chocolate - qty 1) Lot #: [id number] Exp: 01/2017  The following the learning objectives were met by the patient during this course:  Identify Pre-Op Dietary Goals and will begin 2 weeks pre-operatively  Identify appropriate sources of fluids and proteins   State protein recommendations and appropriate sources pre and post-operatively  Identify Post-Operative Dietary Goals and will follow for 2 weeks post-operatively  Identify appropriate multivitamin and calcium sources  Describe the need for physical activity post-operatively and will follow MD recommendations  State when to call healthcare provider regarding medication questions or post-operative complications  Handouts given during class include:  Pre-Op Bariatric Surgery Diet Handout  Protein Shake Handout  Post-Op Bariatric Surgery Nutrition Handout  BELT Program Information Flyer  Support Group Information Flyer  [facility] Bariatric Supplements Price List  Follow-Up Plan: Patient will follow-up at [facility] 2 weeks post operatively for diet advancement per MD.

## 2015-10-12 ENCOUNTER — Ambulatory Visit (HOSPITAL_COMMUNITY)
Admission: RE | Admit: 2015-10-12 | Discharge: 2015-10-12 | Disposition: A | Discharge status: Home / Self Care | Payer: Medicare HMO | Source: Ambulatory Visit | Attending: Surgery | Admitting: Surgery

## 2015-10-12 DIAGNOSIS — N281 Cyst of kidney, acquired: Secondary | ICD-10-CM | POA: Insufficient documentation

## 2015-10-12 DIAGNOSIS — K760 Fatty (change of) liver, not elsewhere classified: Secondary | ICD-10-CM | POA: Diagnosis not present

## 2015-10-12 NOTE — Patient Instructions (Addendum)
Ajeet Casasola  10/12/2015   Your procedure is scheduled on: 10/20/2015    Report to [facility]  Entrance take [facility]  elevators to 3rd floor to  Short Stay Center at     AM.  Call this number if you have problems the morning of surgery [phone]   Remember: ONLY 1 PERSON MAY GO WITH YOU TO SHORT STAY TO GET  READY MORNING OF YOUR SURGERY.  Do not eat food or drink liquids :After Midnight.     Take these medicines the morning of surgery with A SIP OF WATER: Albuterol Inhaler if needed and bring, Dulera, Flomax, Ocean nasal spray if needed DO NOT TAKE ANY DIABETIC MEDICATIONS DAY OF YOUR SURGERY                               You may not have any metal on your body including hair pins and              piercings  Do not wear jewelry,  lotions, powders or perfumes, deodorant                       Men may shave face and neck.   Do not bring valuables to the hospital. [facility] IS NOT             RESPONSIBLE   FOR VALUABLES.  Contacts, dentures or bridgework may not be worn into surgery.  Leave suitcase in the car. After surgery it may be brought to your room.       Special Instructions: coughing and deep breathing exercises, leg exercises               Please read over the following fact sheets you were given: _____________________________________________________________________             [facility] - Preparing for Surgery Before surgery, you can play an important role.  Because skin is not sterile, your skin needs to be as free of germs as possible.  You can reduce the number of germs on your skin by washing with CHG (chlorahexidine gluconate) soap before surgery.  CHG is an antiseptic cleaner which kills germs and bonds with the skin to continue killing germs even after washing. Please DO NOT use if you have an allergy to CHG or antibacterial soaps.  If your skin becomes reddened/irritated stop using the CHG and inform your nurse when you arrive at  Short Stay. Do not shave (including legs and underarms) for at least 48 hours prior to the first CHG shower.  You may shave your face/neck. Please follow these instructions carefully:  1.  Shower with CHG Soap the night before surgery and the  morning of Surgery.  2.  If you choose to wash your hair, wash your hair first as usual with your  normal  shampoo.  3.  After you shampoo, rinse your hair and body thoroughly to remove the  shampoo.                           4.  Use CHG as you would any other liquid soap.  You can apply chg directly  to the skin and wash  Gently with a scrungie or clean washcloth.  5.  Apply the CHG Soap to your body ONLY FROM THE NECK DOWN.   Do not use on face/ open                           Wound or open sores. Avoid contact with eyes, ears mouth and genitals (private parts).                       Wash face,  Genitals (private parts) with your normal soap.             6.  Wash thoroughly, paying special attention to the area where your surgery  will be performed.  7.  Thoroughly rinse your body with warm water from the neck down.  8.  DO NOT shower/wash with your normal soap after using and rinsing off  the CHG Soap.                9.  Pat yourself dry with a clean towel.            10.  Wear clean pajamas.            11.  Place clean sheets on your bed the night of your first shower and do not  sleep with pets. Day of Surgery : Do not apply any lotions/deodorants the morning of surgery.  Please wear clean clothes to the hospital/surgery center.  FAILURE TO FOLLOW THESE INSTRUCTIONS MAY RESULT IN THE CANCELLATION OF YOUR SURGERY PATIENT SIGNATURE_________________________________  NURSE SIGNATURE__________________________________  ________________________________________________________________________

## 2015-10-14 ENCOUNTER — Encounter (HOSPITAL_COMMUNITY): Payer: Self-pay

## 2015-10-14 ENCOUNTER — Encounter (HOSPITAL_COMMUNITY)
Admission: RE | Admit: 2015-10-14 | Discharge: 2015-10-14 | Disposition: A | Discharge status: Home / Self Care | Payer: Medicare HMO | Source: Ambulatory Visit | Attending: Surgery | Admitting: Surgery

## 2015-10-14 DIAGNOSIS — Z01812 Encounter for preprocedural laboratory examination: Secondary | ICD-10-CM | POA: Diagnosis present

## 2015-10-14 HISTORY — DX: Sleep apnea, unspecified: G47.30

## 2015-10-14 LAB — BASIC METABOLIC PANEL
ANION GAP: 7 (ref 5–15)
BUN: 45 mg/dL — ABNORMAL HIGH (ref 6–20)
CALCIUM: 9.9 mg/dL (ref 8.9–10.3)
CO2: 31 mmol/L (ref 22–32)
Chloride: 95 mmol/L — ABNORMAL LOW (ref 101–111)
Creatinine, Ser: 0.86 mg/dL (ref 0.61–1.24)
GFR calc Af Amer: >60 mL/min (ref >60)
GFR calc non Af Amer: >60 mL/min (ref >60)
GLUCOSE: 106 mg/dL — AB (ref 65–99)
Potassium: 3.7 mmol/L (ref 3.5–5.1)
Sodium: 133 mmol/L — ABNORMAL LOW (ref 135–145)

## 2015-10-14 LAB — CBC
HCT: 35.1 % — ABNORMAL LOW (ref 39.0–52.0)
HEMOGLOBIN: 13 g/dL (ref 13.0–17.0)
MCH: 37 pg — AB (ref 26.0–34.0)
MCHC: 37 g/dL — AB (ref 30.0–36.0)
MCV: 100 fL (ref 78.0–100.0)
Platelets: 350 10*3/uL (ref 150–400)
RBC: 3.51 MIL/uL — ABNORMAL LOW (ref 4.22–5.81)
RDW: 15.4 % (ref 11.5–15.5)
WBC: 12.4 10*3/uL — ABNORMAL HIGH (ref 4.0–10.5)

## 2015-10-14 NOTE — Progress Notes (Signed)
03/30/2015- LOV- Deborah Smothers, Np on chart 09/11/2015- Care everywhere- HGBA1C- 5.8 in EPIC  CXR- 04/16/15- EPIC  08/18/15- LOV- Pulmonary- EPIC

## 2015-10-14 NOTE — Progress Notes (Signed)
CBC and BMP done 10/14/15 routed via EPIC to Dr Ovidio Kin.

## 2015-10-19 ENCOUNTER — Other Ambulatory Visit: Payer: Self-pay | Admitting: Surgery

## 2015-10-20 NOTE — Progress Notes (Signed)
Patient called and given new date and time of surgery.  Patient has original preop instructions and has no questions.

## 2015-10-26 ENCOUNTER — Inpatient Hospital Stay (HOSPITAL_COMMUNITY): Payer: Medicare HMO | Admitting: Anesthesiology

## 2015-10-26 ENCOUNTER — Inpatient Hospital Stay (HOSPITAL_COMMUNITY)
Admission: RE | Admit: 2015-10-26 | Discharge: 2015-10-28 | DRG: 621 | Disposition: A | Discharge status: Home / Self Care | Payer: Medicare HMO | Source: Ambulatory Visit | Attending: Surgery | Admitting: Surgery

## 2015-10-26 ENCOUNTER — Encounter (HOSPITAL_COMMUNITY)
Admission: RE | Disposition: A | Discharge status: Home / Self Care | Payer: Self-pay | Source: Ambulatory Visit | Attending: Surgery

## 2015-10-26 ENCOUNTER — Encounter (HOSPITAL_COMMUNITY): Payer: Self-pay | Admitting: *Deleted

## 2015-10-26 DIAGNOSIS — I129 Hypertensive chronic kidney disease with stage 1 through stage 4 chronic kidney disease, or unspecified chronic kidney disease: Secondary | ICD-10-CM | POA: Diagnosis present

## 2015-10-26 DIAGNOSIS — M069 Rheumatoid arthritis, unspecified: Secondary | ICD-10-CM | POA: Diagnosis present

## 2015-10-26 DIAGNOSIS — N183 Chronic kidney disease, stage 3 (moderate): Secondary | ICD-10-CM | POA: Diagnosis present

## 2015-10-26 DIAGNOSIS — F410 Panic disorder [episodic paroxysmal anxiety] without agoraphobia: Secondary | ICD-10-CM | POA: Diagnosis present

## 2015-10-26 DIAGNOSIS — M25552 Pain in left hip: Secondary | ICD-10-CM | POA: Diagnosis present

## 2015-10-26 DIAGNOSIS — G2581 Restless legs syndrome: Secondary | ICD-10-CM | POA: Diagnosis present

## 2015-10-26 DIAGNOSIS — Z87891 Personal history of nicotine dependence: Secondary | ICD-10-CM | POA: Diagnosis not present

## 2015-10-26 DIAGNOSIS — G4733 Obstructive sleep apnea (adult) (pediatric): Secondary | ICD-10-CM | POA: Diagnosis present

## 2015-10-26 DIAGNOSIS — Z79891 Long term (current) use of opiate analgesic: Secondary | ICD-10-CM

## 2015-10-26 DIAGNOSIS — G8929 Other chronic pain: Secondary | ICD-10-CM | POA: Diagnosis present

## 2015-10-26 DIAGNOSIS — E559 Vitamin D deficiency, unspecified: Secondary | ICD-10-CM | POA: Diagnosis present

## 2015-10-26 DIAGNOSIS — I809 Phlebitis and thrombophlebitis of unspecified site: Secondary | ICD-10-CM | POA: Diagnosis present

## 2015-10-26 DIAGNOSIS — Z79899 Other long term (current) drug therapy: Secondary | ICD-10-CM | POA: Diagnosis not present

## 2015-10-26 DIAGNOSIS — Z6841 Body Mass Index (BMI) 40.0 and over, adult: Secondary | ICD-10-CM

## 2015-10-26 DIAGNOSIS — Z96641 Presence of right artificial hip joint: Secondary | ICD-10-CM | POA: Diagnosis present

## 2015-10-26 DIAGNOSIS — I872 Venous insufficiency (chronic) (peripheral): Secondary | ICD-10-CM | POA: Diagnosis present

## 2015-10-26 DIAGNOSIS — Z96651 Presence of right artificial knee joint: Secondary | ICD-10-CM | POA: Diagnosis present

## 2015-10-26 DIAGNOSIS — Z800 Family history of malignant neoplasm of digestive organs: Secondary | ICD-10-CM

## 2015-10-26 DIAGNOSIS — J449 Chronic obstructive pulmonary disease, unspecified: Secondary | ICD-10-CM | POA: Diagnosis present

## 2015-10-26 DIAGNOSIS — Z8701 Personal history of pneumonia (recurrent): Secondary | ICD-10-CM

## 2015-10-26 DIAGNOSIS — Z87442 Personal history of urinary calculi: Secondary | ICD-10-CM

## 2015-10-26 DIAGNOSIS — N281 Cyst of kidney, acquired: Secondary | ICD-10-CM | POA: Diagnosis present

## 2015-10-26 DIAGNOSIS — Z794 Long term (current) use of insulin: Secondary | ICD-10-CM

## 2015-10-26 DIAGNOSIS — Z882 Allergy status to sulfonamides status: Secondary | ICD-10-CM

## 2015-10-26 DIAGNOSIS — E1122 Type 2 diabetes mellitus with diabetic chronic kidney disease: Secondary | ICD-10-CM | POA: Diagnosis present

## 2015-10-26 DIAGNOSIS — M1990 Unspecified osteoarthritis, unspecified site: Secondary | ICD-10-CM | POA: Diagnosis present

## 2015-10-26 DIAGNOSIS — I4891 Unspecified atrial fibrillation: Secondary | ICD-10-CM | POA: Diagnosis not present

## 2015-10-26 DIAGNOSIS — I10 Essential (primary) hypertension: Secondary | ICD-10-CM | POA: Diagnosis not present

## 2015-10-26 DIAGNOSIS — Z833 Family history of diabetes mellitus: Secondary | ICD-10-CM

## 2015-10-26 DIAGNOSIS — R001 Bradycardia, unspecified: Secondary | ICD-10-CM | POA: Diagnosis not present

## 2015-10-26 DIAGNOSIS — Z8249 Family history of ischemic heart disease and other diseases of the circulatory system: Secondary | ICD-10-CM

## 2015-10-26 HISTORY — PX: LAPAROSCOPIC GASTRIC SLEEVE RESECTION: SHX5895

## 2015-10-26 LAB — COMPREHENSIVE METABOLIC PANEL
ALBUMIN: 4.1 g/dL (ref 3.5–5.0)
ALK PHOS: 72 U/L (ref 38–126)
ALT: 19 U/L (ref 17–63)
AST: 21 U/L (ref 15–41)
Anion gap: 5 (ref 5–15)
BUN: 44 mg/dL — ABNORMAL HIGH (ref 6–20)
CALCIUM: 9.3 mg/dL (ref 8.9–10.3)
CHLORIDE: 96 mmol/L — AB (ref 101–111)
CO2: 33 mmol/L — AB (ref 22–32)
CREATININE: 0.88 mg/dL (ref 0.61–1.24)
GFR calc non Af Amer: >60 mL/min (ref >60)
GLUCOSE: 114 mg/dL — AB (ref 65–99)
Potassium: 3.6 mmol/L (ref 3.5–5.1)
SODIUM: 134 mmol/L — AB (ref 135–145)
Total Bilirubin: 0.7 mg/dL (ref 0.3–1.2)
Total Protein: 7 g/dL (ref 6.5–8.1)

## 2015-10-26 LAB — CBC WITH DIFFERENTIAL/PLATELET
BASOS ABS: 0 10*3/uL (ref 0.0–0.1)
BASOS PCT: 0 %
EOS ABS: 0.4 10*3/uL (ref 0.0–0.7)
EOS PCT: 3 %
HCT: 35.4 % — ABNORMAL LOW (ref 39.0–52.0)
HEMOGLOBIN: 12.7 g/dL — AB (ref 13.0–17.0)
LYMPHS ABS: 1.4 10*3/uL (ref 0.7–4.0)
Lymphocytes Relative: 12 %
MCH: 35.5 pg — AB (ref 26.0–34.0)
MCHC: 35.9 g/dL (ref 30.0–36.0)
MCV: 98.9 fL (ref 78.0–100.0)
Monocytes Absolute: 1.1 10*3/uL — ABNORMAL HIGH (ref 0.1–1.0)
Monocytes Relative: 9 %
NEUTROS PCT: 76 %
Neutro Abs: 9 10*3/uL — ABNORMAL HIGH (ref 1.7–7.7)
PLATELETS: 351 10*3/uL (ref 150–400)
RBC: 3.58 MIL/uL — AB (ref 4.22–5.81)
RDW: 15 % (ref 11.5–15.5)
WBC: 11.8 10*3/uL — AB (ref 4.0–10.5)

## 2015-10-26 LAB — GLUCOSE, CAPILLARY
GLUCOSE-CAPILLARY: 196 mg/dL — AB (ref 65–99)
GLUCOSE-CAPILLARY: 159 mg/dL — AB (ref 65–99)
Glucose-Capillary: 116 mg/dL — ABNORMAL HIGH (ref 65–99)
Glucose-Capillary: 146 mg/dL — ABNORMAL HIGH (ref 65–99)

## 2015-10-26 LAB — HEMOGLOBIN AND HEMATOCRIT, BLOOD
HCT: 33.8 % — ABNORMAL LOW (ref 39.0–52.0)
Hemoglobin: 12.5 g/dL — ABNORMAL LOW (ref 13.0–17.0)

## 2015-10-26 SURGERY — GASTRECTOMY, SLEEVE, LAPAROSCOPIC
Anesthesia: General | Site: Abdomen

## 2015-10-26 MED ORDER — SUGAMMADEX SODIUM 500 MG/5ML IV SOLN
INTRAVENOUS | Status: AC
Start: 2015-10-26 — End: 2015-10-26
  Filled 2015-10-26: qty 5

## 2015-10-26 MED ORDER — METOCLOPRAMIDE HCL 5 MG/ML IJ SOLN
INTRAMUSCULAR | Status: AC
  Filled 2015-10-26: qty 2

## 2015-10-26 MED ORDER — METOPROLOL TARTRATE 5 MG/5ML IV SOLN
INTRAVENOUS | Status: DC | PRN
  Administered 2015-10-26 (×2): 1 mg via INTRAVENOUS

## 2015-10-26 MED ORDER — APREPITANT 40 MG PO CAPS
40.0000 mg | ORAL_CAPSULE | ORAL | Status: AC
  Administered 2015-10-26: 40 mg via ORAL

## 2015-10-26 MED ORDER — KETAMINE HCL 10 MG/ML IJ SOLN
INTRAMUSCULAR | Status: DC | PRN
  Administered 2015-10-26 (×5): 10 mg via INTRAVENOUS

## 2015-10-26 MED ORDER — ACETAMINOPHEN 160 MG/5ML PO SOLN: 650.0000 mg | ORAL | Status: DC | PRN

## 2015-10-26 MED ORDER — CEFOTETAN DISODIUM-DEXTROSE 2-2.08 GM-% IV SOLR
2.0000 g | INTRAVENOUS | Status: AC
  Administered 2015-10-26: 2 g via INTRAVENOUS

## 2015-10-26 MED ORDER — HEPARIN SODIUM (PORCINE) 5000 UNIT/ML IJ SOLN
5000.0000 [IU] | INTRAMUSCULAR | Status: AC
  Administered 2015-10-26: 5000 [IU] via SUBCUTANEOUS
  Filled 2015-10-26: qty 1

## 2015-10-26 MED ORDER — CHLORHEXIDINE GLUCONATE 4 % EX LIQD: 60.0000 mL | Freq: Once | CUTANEOUS | Status: DC

## 2015-10-26 MED ORDER — TISSEEL VH 10 ML EX KIT
PACK | CUTANEOUS | Status: AC
  Filled 2015-10-26: qty 1

## 2015-10-26 MED ORDER — LACTATED RINGERS IV SOLN
INTRAVENOUS | Status: DC
Start: 2015-10-26 — End: 2015-10-26
  Administered 2015-10-26 (×3): via INTRAVENOUS

## 2015-10-26 MED ORDER — OXYCODONE HCL 5 MG/5ML PO SOLN
5.0000 mg | ORAL | Status: DC | PRN
  Administered 2015-10-27 – 2015-10-28 (×2): 10 mg via ORAL
  Filled 2015-10-26 (×2): qty 10

## 2015-10-26 MED ORDER — SUGAMMADEX SODIUM 200 MG/2ML IV SOLN
INTRAVENOUS | Status: DC | PRN
  Administered 2015-10-26: 300 mg via INTRAVENOUS
  Administered 2015-10-26: 200 mg via INTRAVENOUS

## 2015-10-26 MED ORDER — HYDROMORPHONE HCL 1 MG/ML IJ SOLN
INTRAMUSCULAR | Status: DC | PRN
  Administered 2015-10-26: 0.5 mg via INTRAVENOUS
  Administered 2015-10-26: 1.5 mg via INTRAVENOUS

## 2015-10-26 MED ORDER — FENTANYL CITRATE (PF) 100 MCG/2ML IJ SOLN
INTRAMUSCULAR | Status: DC | PRN
  Administered 2015-10-26: 50 ug via INTRAVENOUS
  Administered 2015-10-26: 25 ug via INTRAVENOUS
  Administered 2015-10-26: 50 ug via INTRAVENOUS
  Administered 2015-10-26: 25 ug via INTRAVENOUS
  Administered 2015-10-26: 50 ug via INTRAVENOUS

## 2015-10-26 MED ORDER — ONDANSETRON HCL 4 MG/2ML IJ SOLN
INTRAMUSCULAR | Status: AC
  Filled 2015-10-26: qty 2

## 2015-10-26 MED ORDER — ROCURONIUM BROMIDE 100 MG/10ML IV SOLN
INTRAVENOUS | Status: AC
  Filled 2015-10-26: qty 1

## 2015-10-26 MED ORDER — LIDOCAINE HCL (CARDIAC) 20 MG/ML IV SOLN
INTRAVENOUS | Status: AC
  Filled 2015-10-26: qty 5

## 2015-10-26 MED ORDER — ONDANSETRON HCL 4 MG/2ML IJ SOLN: 4.0000 mg | INTRAMUSCULAR | Status: DC | PRN

## 2015-10-26 MED ORDER — BUPIVACAINE-EPINEPHRINE (PF) 0.25% -1:200000 IJ SOLN
INTRAMUSCULAR | Status: AC
  Filled 2015-10-26: qty 30

## 2015-10-26 MED ORDER — 0.9 % SODIUM CHLORIDE (POUR BTL) OPTIME
TOPICAL | Status: DC | PRN
  Administered 2015-10-26: 1000 mL

## 2015-10-26 MED ORDER — MORPHINE SULFATE (PF) 2 MG/ML IV SOLN
2.0000 mg | INTRAVENOUS | Status: DC | PRN
  Administered 2015-10-26 (×3): 2 mg via INTRAVENOUS
  Administered 2015-10-26: 4 mg via INTRAVENOUS
  Administered 2015-10-27: 2 mg via INTRAVENOUS
  Administered 2015-10-27 (×2): 4 mg via INTRAVENOUS
  Administered 2015-10-27 (×2): 2 mg via INTRAVENOUS
  Administered 2015-10-28 (×2): 4 mg via INTRAVENOUS
  Filled 2015-10-26: qty 1
  Filled 2015-10-26 (×3): qty 2
  Filled 2015-10-26: qty 1
  Filled 2015-10-26: qty 2
  Filled 2015-10-26: qty 1
  Filled 2015-10-26: qty 2
  Filled 2015-10-26 (×3): qty 1

## 2015-10-26 MED ORDER — ROCURONIUM BROMIDE 100 MG/10ML IV SOLN
INTRAVENOUS | Status: DC | PRN
  Administered 2015-10-26: 20 mg via INTRAVENOUS
  Administered 2015-10-26: 50 mg via INTRAVENOUS
  Administered 2015-10-26: 10 mg via INTRAVENOUS

## 2015-10-26 MED ORDER — DIPHENHYDRAMINE HCL 50 MG/ML IJ SOLN
INTRAMUSCULAR | Status: DC | PRN
  Administered 2015-10-26: 12.5 mg via INTRAVENOUS

## 2015-10-26 MED ORDER — MIDAZOLAM HCL 2 MG/2ML IJ SOLN
INTRAMUSCULAR | Status: AC
  Filled 2015-10-26: qty 2

## 2015-10-26 MED ORDER — MEPERIDINE HCL 50 MG/ML IJ SOLN: 6.2500 mg | INTRAMUSCULAR | Status: DC | PRN

## 2015-10-26 MED ORDER — LACTATED RINGERS IR SOLN
Status: DC | PRN
  Administered 2015-10-26: 1000 mL

## 2015-10-26 MED ORDER — DEXAMETHASONE SODIUM PHOSPHATE 10 MG/ML IJ SOLN
INTRAMUSCULAR | Status: AC
  Filled 2015-10-26: qty 1

## 2015-10-26 MED ORDER — ACETAMINOPHEN 160 MG/5ML PO SOLN: 325.0000 mg | ORAL | Status: DC | PRN

## 2015-10-26 MED ORDER — HYDROMORPHONE HCL 1 MG/ML IJ SOLN
0.2500 mg | INTRAMUSCULAR | Status: DC | PRN
  Administered 2015-10-26: 0.5 mg via INTRAVENOUS
  Administered 2015-10-26 (×2): 0.25 mg via INTRAVENOUS
  Administered 2015-10-26: 0.5 mg via INTRAVENOUS

## 2015-10-26 MED ORDER — METOCLOPRAMIDE HCL 5 MG/ML IJ SOLN
INTRAMUSCULAR | Status: DC | PRN
  Administered 2015-10-26: 10 mg via INTRAVENOUS

## 2015-10-26 MED ORDER — DEXAMETHASONE SODIUM PHOSPHATE 4 MG/ML IJ SOLN
INTRAMUSCULAR | Status: DC | PRN
  Administered 2015-10-26: 10 mg via INTRAVENOUS

## 2015-10-26 MED ORDER — TISSEEL VH 10 ML EX KIT
PACK | CUTANEOUS | Status: DC | PRN
  Administered 2015-10-26: 20 mL

## 2015-10-26 MED ORDER — HYDROMORPHONE HCL 1 MG/ML IJ SOLN
INTRAMUSCULAR | Status: AC
  Filled 2015-10-26: qty 1

## 2015-10-26 MED ORDER — KETAMINE HCL 10 MG/ML IJ SOLN
INTRAMUSCULAR | Status: AC
  Filled 2015-10-26: qty 1

## 2015-10-26 MED ORDER — POTASSIUM CHLORIDE IN NACL 20-0.45 MEQ/L-% IV SOLN
INTRAVENOUS | Status: DC
  Administered 2015-10-26 – 2015-10-28 (×6): via INTRAVENOUS
  Filled 2015-10-26 (×6): qty 1000

## 2015-10-26 MED ORDER — LACTATED RINGERS IV SOLN
INTRAVENOUS | Status: DC
  Administered 2015-10-26: 13:00:00 via INTRAVENOUS

## 2015-10-26 MED ORDER — INSULIN ASPART 100 UNIT/ML ~~LOC~~ SOLN
0.0000 [IU] | SUBCUTANEOUS | Status: DC
  Administered 2015-10-26: 3 [IU] via SUBCUTANEOUS
  Administered 2015-10-26 (×2): 4 [IU] via SUBCUTANEOUS
  Administered 2015-10-27 (×5): 3 [IU] via SUBCUTANEOUS

## 2015-10-26 MED ORDER — MIDAZOLAM HCL 5 MG/5ML IJ SOLN
INTRAMUSCULAR | Status: DC | PRN
  Administered 2015-10-26: 2 mg via INTRAVENOUS

## 2015-10-26 MED ORDER — HYDROMORPHONE HCL 2 MG/ML IJ SOLN
INTRAMUSCULAR | Status: AC
  Filled 2015-10-26: qty 1

## 2015-10-26 MED ORDER — ONDANSETRON HCL 4 MG/2ML IJ SOLN
INTRAMUSCULAR | Status: DC | PRN
  Administered 2015-10-26: 4 mg via INTRAVENOUS

## 2015-10-26 MED ORDER — BUPIVACAINE HCL 0.25 % IJ SOLN
INTRAMUSCULAR | Status: DC | PRN
  Administered 2015-10-26: 30 mL

## 2015-10-26 MED ORDER — FENTANYL CITRATE (PF) 100 MCG/2ML IJ SOLN
INTRAMUSCULAR | Status: AC
  Filled 2015-10-26: qty 4

## 2015-10-26 MED ORDER — HEPARIN SODIUM (PORCINE) 5000 UNIT/ML IJ SOLN
5000.0000 [IU] | Freq: Three times a day (TID) | INTRAMUSCULAR | Status: DC
  Administered 2015-10-26 – 2015-10-28 (×5): 5000 [IU] via SUBCUTANEOUS
  Filled 2015-10-26 (×6): qty 1

## 2015-10-26 MED ORDER — METOCLOPRAMIDE HCL 5 MG/ML IJ SOLN: 10.0000 mg | Freq: Once | INTRAMUSCULAR | Status: DC | PRN

## 2015-10-26 MED ORDER — PROPOFOL 10 MG/ML IV BOLUS
INTRAVENOUS | Status: DC | PRN
  Administered 2015-10-26: 200 mg via INTRAVENOUS

## 2015-10-26 MED ORDER — LIDOCAINE HCL (CARDIAC) 20 MG/ML IV SOLN
INTRAVENOUS | Status: DC | PRN
  Administered 2015-10-26: 50 mg via INTRAVENOUS

## 2015-10-26 MED ORDER — FENTANYL CITRATE (PF) 100 MCG/2ML IJ SOLN: 25.0000 ug | INTRAMUSCULAR | Status: DC | PRN

## 2015-10-26 MED ORDER — CEFOTETAN DISODIUM-DEXTROSE 2-2.08 GM-% IV SOLR
INTRAVENOUS | Status: AC
  Filled 2015-10-26: qty 50

## 2015-10-26 MED ORDER — PREMIER PROTEIN SHAKE: 2.0000 [oz_av] | ORAL | Status: DC

## 2015-10-26 SURGICAL SUPPLY — 72 items
APL SRG 32X5 SNPLK LF DISP (MISCELLANEOUS) ×1
APPLICATOR COTTON TIP 6IN STRL (MISCELLANEOUS) IMPLANT
APPLIER CLIP ROT 10 11.4 M/L (STAPLE)
APPLIER CLIP ROT 13.4 12 LRG (CLIP) ×3
APR CLP LRG 13.4X12 ROT 20 MLT (CLIP) ×1
APR CLP MED LRG 11.4X10 (STAPLE)
BLADE SURG 15 STRL LF DISP TIS (BLADE) ×1 IMPLANT
BLADE SURG 15 STRL SS (BLADE) ×3
CABLE HIGH FREQUENCY MONO STRZ (ELECTRODE) ×2 IMPLANT
CHLORAPREP W/TINT 26ML (MISCELLANEOUS) ×3 IMPLANT
CLIP APPLIE ROT 10 11.4 M/L (STAPLE) IMPLANT
CLIP APPLIE ROT 13.4 12 LRG (CLIP) IMPLANT
COVER SURGICAL LIGHT HANDLE (MISCELLANEOUS) ×3 IMPLANT
DEVICE SUT QUICK LOAD TK 5 (STAPLE) IMPLANT
DEVICE SUT TI-KNOT TK 5X26 (MISCELLANEOUS) IMPLANT
DEVICE SUTURE ENDOST 10MM (ENDOMECHANICALS) IMPLANT
DEVICE TI KNOT TK5 (MISCELLANEOUS)
DEVICE TROCAR PUNCTURE CLOSURE (ENDOMECHANICALS) ×3 IMPLANT
DISSECTOR BLUNT TIP ENDO 5MM (MISCELLANEOUS) IMPLANT
DRAPE UTILITY XL STRL (DRAPES) ×6 IMPLANT
DUPLOCATH 180 (CATHETERS) ×4 IMPLANT
ELECT REM PT RETURN 9FT ADLT (ELECTROSURGICAL) ×3
ELECTRODE REM PT RTRN 9FT ADLT (ELECTROSURGICAL) ×1 IMPLANT
GAUZE SPONGE 4X4 12PLY STRL (GAUZE/BANDAGES/DRESSINGS) IMPLANT
GLOVE SURG SIGNA 7.5 PF LTX (GLOVE) ×3 IMPLANT
GOWN STRL REUS W/TWL XL LVL3 (GOWN DISPOSABLE) ×9 IMPLANT
HOVERMATT SINGLE USE (MISCELLANEOUS) ×3 IMPLANT
IRRIG SUCT STRYKERFLOW 2 WTIP (MISCELLANEOUS) ×3
IRRIGATION SUCT STRKRFLW 2 WTP (MISCELLANEOUS) ×1 IMPLANT
KIT BASIN OR (CUSTOM PROCEDURE TRAY) ×3 IMPLANT
LIQUID BAND (GAUZE/BANDAGES/DRESSINGS) ×3 IMPLANT
MARKER SKIN DUAL TIP RULER LAB (MISCELLANEOUS) ×3 IMPLANT
NDL SPNL 22GX3.5 QUINCKE BK (NEEDLE) ×1 IMPLANT
NEEDLE SPNL 22GX3.5 QUINCKE BK (NEEDLE) ×3 IMPLANT
PACK UNIVERSAL I (CUSTOM PROCEDURE TRAY) ×3 IMPLANT
QUICK LOAD TK 5 (STAPLE)
RELOAD STAPLE 60 3.6 BLU REG (STAPLE) IMPLANT
RELOAD STAPLE 60 3.8 GOLD REG (STAPLE) IMPLANT
RELOAD STAPLE 60 4.1 GRN THCK (STAPLE) ×2 IMPLANT
RELOAD STAPLER BLUE 60MM (STAPLE) ×2 IMPLANT
RELOAD STAPLER GOLD 60MM (STAPLE) ×2 IMPLANT
RELOAD STAPLER GREEN 60MM (STAPLE) ×2 IMPLANT
SCISSORS LAP 5X35 DISP (ENDOMECHANICALS) ×3 IMPLANT
SEALANT SURGICAL APPL DUAL CAN (MISCELLANEOUS) ×3 IMPLANT
SHEARS HARMONIC ACE PLUS 45CM (MISCELLANEOUS) ×3 IMPLANT
SLEEVE ADV FIXATION 12X100MM (TROCAR) ×2 IMPLANT
SLEEVE ADV FIXATION 5X100MM (TROCAR) ×3 IMPLANT
SLEEVE GASTRECTOMY 36FR VISIGI (MISCELLANEOUS) ×3 IMPLANT
SOLUTION ANTI FOG 6CC (MISCELLANEOUS) ×3 IMPLANT
SPONGE LAP 18X18 X RAY DECT (DISPOSABLE) ×3 IMPLANT
STAPLER ECHELON LONG 60 440 (INSTRUMENTS) ×3 IMPLANT
STAPLER RELOAD BLUE 60MM (STAPLE) ×6
STAPLER RELOAD GOLD 60MM (STAPLE) ×6
STAPLER RELOAD GREEN 60MM (STAPLE) ×6
SUT MNCRL AB 4-0 PS2 18 (SUTURE) ×3 IMPLANT
SUT SURGIDAC NAB ES-9 0 48 120 (SUTURE) IMPLANT
SUT VICRYL 0 TIES 12 18 (SUTURE) ×3 IMPLANT
SYR 10ML ECCENTRIC (SYRINGE) ×3 IMPLANT
SYR 20CC LL (SYRINGE) ×3 IMPLANT
TOWEL OR 17X26 10 PK STRL BLUE (TOWEL DISPOSABLE) ×3 IMPLANT
TOWEL OR NON WOVEN STRL DISP B (DISPOSABLE) ×3 IMPLANT
TRAY FOLEY W/METER SILVER 14FR (SET/KITS/TRAYS/PACK) ×1 IMPLANT
TRAY FOLEY W/METER SILVER 16FR (SET/KITS/TRAYS/PACK) ×1 IMPLANT
TROCAR ADV FIXATION 12X100MM (TROCAR) ×3 IMPLANT
TROCAR ADV FIXATION 5X100MM (TROCAR) ×3 IMPLANT
TROCAR BLADELESS 15MM (ENDOMECHANICALS) ×3 IMPLANT
TROCAR BLADELESS OPT 5 100 (ENDOMECHANICALS) ×3 IMPLANT
TROCAR XCEL 12X100 BLDLESS (ENDOMECHANICALS) ×2 IMPLANT
TUBING CONNECTING 10 (TUBING) ×2 IMPLANT
TUBING CONNECTING 10' (TUBING) ×1
TUBING ENDO SMARTCAP PENTAX (MISCELLANEOUS) ×3 IMPLANT
TUBING INSUF HEATED (TUBING) ×3 IMPLANT

## 2015-10-26 NOTE — Op Note (Signed)
PATIENT:   Anthony Friedman DOB:   September 13, 1950 MRN:   [id number]  DATE OF PROCEDURE: 10/26/2015                   FACILITY:  [facility]  OPERATIVE REPORT  PREOPERATIVE DIAGNOSIS:  Morbid obesity.  POSTOPERATIVE DIAGNOSIS:  Morbid obesity (weight 328, BMI of 51.5).  PROCEDURE:  Laparoscopic Sleeve gastrectomy (intraoperative upper endoscopy by Sheron Nightingale)  SURGEON:  Sandria Bales. Ezzard Standing, MD  FIRST ASSISTANTSheron Nightingale, M.D.  ANESTHESIA:  General endotracheal.  Anesthesiologist: Phillips Grout, MD CRNA: Ludwig Lean, CRNA; Doran Clay, CRNA  General  ESTIMATED BLOOD LOSS:  Minimal.  LOCAL ANESTHESIA:  30 cc of 1/4% Marcaine  COMPLICATIONS:  None.  INDICATION FOR SURGERY:  Anthony Friedman is a 65 y.o. white male who sees Smothers, Cathleen Corti, NP as her primary care doctor.  He has completed our preoperative bariatric program and now comes for a laparoscopic sleeve gastrectomy.  The indications, potential complications of surgery were explained to the patient.  Potential complications of the surgery include, but are not limited to, bleeding, infection, DVT, open surgery, and long-term nutritional consequences.  OPERATIVE NOTE:  The patient taken to room #1 at [facility] where Mr. Marian underwent a general endotracheal anesthetic, supervised by Anesthesiologist: Phillips Grout, MD CRNA: Ludwig Lean, CRNA; Doran Clay, CRNA.  The patient was given 2 g of cefotetan at the beginning of the procedure.  A time-out was held and surgical checklist run.  I accessed her abdominal cavity through the left upper quadrant with a 5 mm Optiview. I later upsized this trocar to a 12 mm to fire some staples.  I did an abdominal exploration.   His omentum and bowel were unremarkable. The right and left lobes of the liver unremarkable. Gallbladder was normal. His stomach was unremarkable.   I placed a total of 6 trocars. I placed a 5 mm left lateral trocar, a 5 mm left paramedian trocar (for the scope), a 12 mm right  paramedian torcar, a 5 mm right subcostal trocar that I converted to a 15 mm to extract the stomach and 5 mm subxiphoid trocar for the liver retractor.  I started out taking down the greater curvature attachments of the stomach. I measured approximately 6 cm proximal from the pylorus and mobilized the greater curvature of the stomach with the Harmonic Scalpel. I took this dissection cranially around the greater curvature of his stomach to the angle of Hiss and the left crus.   After I had mobilized the greater curvature of the stomach, I then passed the 36 French ViSiGi bougie which was used to suck the stomach up against the lesser curvature and placed the tip of the ViSiGi into the antrum. During the staple firing,  I tried to give the ViSiGi a cuff at least about 1 cm. I tried to avoid narrowing the incisura. I used a total of 6 staple firings.  From antrum to the angle of His, I used 2 green, 2 gold and 2 blue Eschelon 60 mm Ethicon staplers. I did not use staple line reinforcement.   At each firing of the EndoGIA stapler, I inspected the stomach, anterior wall of the stomach, and underneath to make sure there was no compromise or impingement on to the ViSiGi bougie.   The staple line seemed linear without any corkscrewing of the stomach. Hemostasis was good. I did not use any reinforcement. She did have at least 4 areas of bleeding which  I used clips to clip on the new greater curvature of the stomach.  Because I thought we had a good staple line, I then had the ViSiGi was converted to insufflate the pouch. The new stomach pouch was placed under water. There was no bubbling or leak noted.   At this point, Dr. Daphine Deutscher broke scrub and passed an upper endoscope down through the esophagus into the stomach pouch. The stomach was tubular. There was no narrowing of the stomach pouch or angulation. We were easily able to pass the endoscope into the antrum and again put air pressure on the staple line. I  irrigated the upper abdomen with saline. There was no bubbling or evidence of air leak. The mucosa looked viable. Dr. Daphine Deutscher decompressed the stomach with the endoscopy.   I converted to right subcostal trocar to a 15 trocar and extracted the stomach remnant through this intact and sent this to Pathology. I then placed 20 cc of Tisseel along the new greater curvature staple line and covered the entire staple line with the Tisseel.  I aspirated out the saline that I had irrigated because I thought the staple line looked viable and complete. There was no evidence of leak. I did not leave a drain in place.   Then, I closed the trocar sites. I placed 0 Vicryl sutures at the 15-mm port site in the right upper quadrant. The other port sites seemed smaller not requiring sutures. I closed the skin at each site with a 4-0 Monocryl, painted each wound with LiquiBand.   The patient was transported to recovery room in good condition. Sponge and needle count were correct at the end of the case.   Because of his pulmonary disease, I will place him in step down post op.   Ovidio Kin, MD, [facility] Surgery Pager: [phone] Office phone:  [phone]

## 2015-10-26 NOTE — Anesthesia Procedure Notes (Signed)
Procedure Name: Intubation Date/Time: 10/26/2015 10:09 AM Performed by: Ludwig Lean Pre-anesthesia Checklist: Patient identified, Emergency Drugs available, Suction available and Patient being monitored Patient Re-evaluated:Patient Re-evaluated prior to inductionOxygen Delivery Method: Circle system utilized Preoxygenation: Pre-oxygenation with 100% oxygen Intubation Type: IV induction Ventilation: Mask ventilation without difficulty Laryngoscope Size: Mac Grade View: Grade II Tube type: Oral Tube size: 7.5 mm Number of attempts: 1 Airway Equipment and Method: Stylet,  Oral airway and Bougie stylet Placement Confirmation: ETT inserted through vocal cords under direct vision,  positive ETCO2 and breath sounds checked- equal and bilateral Secured at: 21 cm Tube secured with: Tape Dental Injury: Teeth and Oropharynx as per pre-operative assessment

## 2015-10-26 NOTE — Interval H&P Note (Signed)
History and Physical Interval Note:  10/26/2015 9:30 AM  Anthony Friedman  has presented today for surgery, with the diagnosis of Morbid Obesity, HTN, Diabetes Mellitus, COPD, OSA  The various methods of treatment have been discussed with the patient and family.   His wife is here with him.  His left hip is bothering him because he is off his pain meds.  After consideration of risks, benefits and other options for treatment, the patient has consented to  Procedure(s): LAPAROSCOPIC GASTRIC SLEEVE RESECTION, UPPER ENDO (N/A) as a surgical intervention .  The patient's history has been reviewed, patient examined, no change in status, stable for surgery.  I have reviewed the patient's chart and labs.  Questions were answered to the patient's satisfaction.     Israel Werts H

## 2015-10-26 NOTE — Transfer of Care (Signed)
Immediate Anesthesia Transfer of Care Note  Patient: Anthony Friedman  Procedure(s) Performed: Procedure(s): LAPAROSCOPIC GASTRIC SLEEVE RESECTION, UPPER ENDO (N/A)  Patient Location: PACU  Anesthesia Type:MAC  Level of Consciousness: Patient easily awoken, sedated, comfortable, cooperative, following commands, responds to stimulation.   Airway & Oxygen Therapy: Patient spontaneously breathing, ventilating well, oxygen via simple oxygen mask.  Post-op Assessment: Report given to PACU RN, vital signs reviewed and stable, moving all extremities.   Post vital signs: Reviewed and stable.  Complications: No apparent anesthesia complications  Last Vitals:  Vitals:   10/26/15 0822  BP: (!) 106/56  Pulse: (!) 56  Resp: 18  Temp: 36.9 C    Last Pain:  Vitals:   10/26/15 0930  TempSrc:   PainSc: 10-Worst pain ever         Complications: No apparent anesthesia complications

## 2015-10-26 NOTE — Anesthesia Preprocedure Evaluation (Signed)
Anesthesia Evaluation  Patient identified by MRN, date of birth, ID band Patient awake    Reviewed: Allergy & Precautions, NPO status , Patient's Chart, lab work & pertinent test results  Airway Mallampati: II  TM Distance: >3 FB Neck ROM: Full    Dental no notable dental hx.    Pulmonary sleep apnea and Continuous Positive Airway Pressure Ventilation , COPD, former smoker,    Pulmonary exam normal breath sounds clear to auscultation       Cardiovascular hypertension, Pt. on medications Normal cardiovascular exam Rhythm:Regular Rate:Normal     Neuro/Psych negative neurological ROS  negative psych ROS   GI/Hepatic negative GI ROS, Neg liver ROS,   Endo/Other  diabetes, Type 2, Oral Hypoglycemic AgentsMorbid obesity  Renal/GU negative Renal ROS  negative genitourinary   Musculoskeletal  (+) Arthritis , Rheumatoid disorders,    Abdominal   Peds negative pediatric ROS (+)  Hematology negative hematology ROS (+)   Anesthesia Other Findings   Reproductive/Obstetrics negative OB ROS                            Anesthesia Physical Anesthesia Plan  ASA: III  Anesthesia Plan: General   Post-op Pain Management:    Induction: Intravenous  Airway Management Planned: Oral ETT  Additional Equipment:   Intra-op Plan:   Post-operative Plan: Extubation in OR and Post-operative intubation/ventilation  Informed Consent: I have reviewed the patients History and Physical, chart, labs and discussed the procedure including the risks, benefits and alternatives for the proposed anesthesia with the patient or authorized representative who has indicated his/her understanding and acceptance.   Dental advisory given  Plan Discussed with: CRNA  Anesthesia Plan Comments:         Anesthesia Quick Evaluation

## 2015-10-26 NOTE — Progress Notes (Signed)
Pt has home CPAP and wishes to utilize it tonight.  RT inspected machine for damages  and defects and none were found.  Machine is working properly at this time.  Pt stated that he will self administer CPAP when he is ready for bed.  RT to monitor and assess as needed.

## 2015-10-26 NOTE — Op Note (Signed)
Anthony Friedman [id number] 1950-08-06 10/26/2015  Preoperative diagnosis: sleeve gastrectomy in progress  Postoperative diagnosis: Same   Procedure: Upper endoscopy   Surgeon: Susy Frizzle B. Daphine Deutscher  M.D., FACS   Anesthesia: Gen.   Indications for procedure: This patient was undergoing a sleeve gastrectomy by Dr. Ezzard Standing.      Description of procedure: The endoscopy was placed in the mouth and into the oropharynx and under endoscopic vision it was advanced to the esophagogastric junction.  The pouch was insufflated and no bubbles were seen.  The sleeve was cylindrical and the antrum and pylorus were visualized.   No bleeding or leaks were detected.  The scope was withdrawn without difficulty.     Matt B. Daphine Deutscher, MD, FACS General, Bariatric, & Minimally Invasive Surgery [facility], [place]

## 2015-10-26 NOTE — H&P (Signed)
Clemens Catholic  Location: [facility] Patient #: [id number] DOB: [birth year] Married / Language: English / Race: White Male  History of Present Illness   The patient is a 65 year old male who presents for a bariatric surgery evaluation.   His PCP is Maxcine Ham, NP. She is at [facility] with Dr. Everlene Other.  He saw Dr. Delford Field. Now he is going to see Dr. Kendrick Fries.  Pain clinic in [facility] - Dr. Ward Chatters [facility] [place], [place])  Ortho - Dr. Maureen Ralphs  He comes by himself.   He has successfully lost 29 pounds in gettng ready for surgery. His surgery is scheduled for next week. We spent time talked about steroid injections. I want him to wait at least one month post op. He takes chronic narcotics. Some of his narcotics were "stolen". I will give him a prescription for post op pain meds, but he realizes that I am not to give him chronic meds. We talked about timing of left hip surgery by Dr. Magnus Ivan - depends on how he does with sleeve and weight loss. I talked about "malnutrition" during weight loss.  I gave him prescription for meds: oxycodone, Zofran, Protonix - he is going to wait to see if he needs these because of cost. He uses two canes - will get PT and walked in hospital. Also, he will be in ICU at least overnight post op. I will probably swallow him. We talked about a realistic goal weight of 210 - 230 pounds. He would like to do better. I went over the surgery with him again.  HIs UGI - 10/12/2015 - normal His [place] of abdomen - 10/12/2015 - right renal cyst He saw Dr. Kendrick Fries on 08/18/2015  History of weight problems: He is interested in weight loss surgery - his initial weight is 357 and his BMI - 54.3. He questions our scales. He has seen Dr. Adolphus Birchwood and was set up for surgery, then he found out that pulmonary consult from 04/16/2015 recommended step down availability and pulm consult post op. So he was told he neede to go somewhere else. Dr.  Adolphus Birchwood did not recommend out program because he said our "our post op follow up" was not good. His wife had colon surgery by Dr. Adolphus Birchwood. He has been to the online information session with [facility]. He has been through the nutritionist. His brother had a lap band in [place], but is now getting this removed. He was a little uncertain where he was in the process. He knows people who have had gastric bypasses and sleeves. He has tried multiple diets including Weight Watchers, Atkins, Optifast, low-calorie diets - all without sustained success and weight loss. The lowest he can remember as being adult was weighing about 220 pounds in 1984, but he says he has always been overweight. He quit smoking in 2009 and he quit drinking alcohol in 2012. So he said this is the third leg of his trip to health.  His psych eval - 03/19/2015 - Tyler Bing-Lawson - eliminate night snacking, eliminate coffee, be consistent with meals Labs done - 04/09/2015 - Vit D - 24 (normal 30-100) I talked to hmi and his wife about weight loss before surgery. It would be good if he could get his weight below 330 (that would be approx a BMI below 50). But I note in the records - his weight was 347 in 01/21/2015. I don't think the weight loss needs to hold up surgery, but it would make  his surgery safer.  Per the 1991 NIH Consensus Statement, the patient is a candidate for bariatric surgery. The patient has been through the program in [place] ([place] The patient is interested in the sleeve gastrectomy. I discussed with the patient the indications and risks of bariatric surgery. The potential risks of surgery include, but are not limited to, bleeding, infection, leak from the bowel, DVT and PE, open surgery, long term nutrition consequences, and death. The patient understands the importance of compliance and long term follow-up with our group after surgery.  Past Medical History: 1.  HTN He saw Dr. Trudi Ida - 03/30/2015 - cleared for surgery 2. COPD Quit smoking in 2009 Uses BiPAP at night. He just started in Nov 2016 - but where he used to have 60 apnic episodes, he now just has 4. He saw Dr. Kendrick Fries on 08/18/2015. Dr. Kendrick Fries has suggestions in his Epic note. Note: He saw Rubye Oaks, NP, in 04/16/2015. It looks like Dr. Elisabeth Cara was associated with her progress note. (I gave the patient a copy of the note, so he understood the risk factors that Ms. Parrett was raising) 3. OSA Pulmonary emphysema 4. DM Hgb A1C - 04/08/2015 - 5.9 On Metformin only 5. Arthritis 6. Chronic back pain He goes to the pain management clinic in [facility] - Dr. Naoma Diener. But he usually sees the PA, [employer]. He takes 10 - 20 mg hydrocodone per day, he takes 0 - 10 mg oxycodone per day 7. Spinal cord stimulator He has had a fusion of L1 - S1.  His last back surgery was around 2006. Dr. Veronia Beets, in [facility] did the surgery. He has since left [place]. He is not currently being followed by a surgeon for his back. He said that the pain specialist have followed this more now. 8. Left hip pain He is getting a steroid injection - Dr. Maureen Ralphs - tomorrow He had a right hip operation by dr. Maureen Ralphs in 2016. He is planning left hip surgery after surgery, but this will depend on how he does. He is anxious to have the surgery. 9. Rheumatoid arthritis Sees Dr. Dierdre Forth On methotrexate - he is now getting a shot q 1 week. This is stopped for now. 10. He has had both knees replaced - 2008 - Drs. Pauley and Weller in [place]. 11. 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 In 2012, he took disability. 12. Vit D deficiency 13. History of panic attacks  Social History: Married (his first marriage, her second) His  wife has 4 children. So he is their stepfather. He knows Gennette Pac from church.   Problem List/Past Medical Kandis Cocking, MD; 10/19/2015 2:55 PM) MORBID OBESITY (E66.01) DIABETES (E11.9) COPD (CHRONIC OBSTRUCTIVE PULMONARY DISEASE) (J44.9) HYPERTENSION (I10) SLEEP APNEA, OBSTRUCTIVE (G47.33)  Other Problems Kandis Cocking, MD; 10/19/2015 2:55 PM) Alcohol Abuse Arthritis Back Pain Chronic Obstructive Lung Disease Diabetes Mellitus High blood pressure Kidney Stone Other disease, cancer, significant illness Sleep Apnea Umbilical Hernia Repair  Past Surgical History Kandis Cocking, MD; 10/19/2015 2:55 PM) Hip Surgery Right. Knee Surgery Bilateral. Oral Surgery Spinal Surgery - Lower Back Ventral / Umbilical Hernia Surgery Bilateral.  Allergies (April Staton, CMA; 10/19/2015 2:36 PM) Sulfa Antibiotics No Known Drug Allergies08/14/2017 (Marked as Inactive)  Medication History (April Staton, CMA; 10/19/2015 2:36 PM) Atenolol-Chlorthalidone (100-25MG  Tablet, Oral) Active. Losartan Potassium (50MG  Tablet, Oral) Active. Dulera (200-5MCG/ACT Aerosol, Inhalation  twice a week) Active. Methotrexate Sodium (50MG /2ML Solution, Injection once a week) Active. Tamsulosin HCl (0.4MG  Capsule, Oral) Active. MetFORMIN HCl (500MG  Tablet, Oral two times daily) Active. ROPINIRole HCl (1MG  Tablet, Oral) Active. Furosemide (20MG  Tablet, Oral) Active. Folic Acid (1MG  Tablet, Oral) Active. Hydrocodone-Acetaminophen (10-325MG  Tablet, Oral) Active. OxyCODONE HCl (5MG  Tablet, Oral) Active. Multiple Vitamin (Oral) Active. Calcium Carbonate (600MG  Tablet, Oral) Active. Iron (325 (65 Fe)MG Tablet, Oral) Active. Medications Reconciled  Vitals (April Staton CMA; 10/19/2015 2:37 PM) 10/19/2015 2:36 PM Weight: 328.5 lb Height: 67in Body Surface Area: 2.5 m Body Mass Index: 51.45 kg/m  Temp.: 97.27F(Oral)  Pulse: 62 (Regular)  P.OX: 97% (Room  air) BP: 120/60 (Sitting, Left Arm, Standard)   Physical Exam  General: Obese WM alert and generally healthy appearing. He is talkative. He comes using two canes to assist in walking. HEENT: Normal. Pupils equal.  Neck: Supple. No mass. No thyroid mass.  Lymph Nodes: No supraclavicular or cervical nodes.  Lungs: Clear to auscultation and symmetric breath sounds. Heart: RRR. No murmur or rub.  Abdomen: Soft. No mass. No tenderness. No hernia. Normal bowel sounds.   Absent umbilicus (removed when he had an umbilical herniar repaired as a teenager)  He has a significant pannus.  His abdomen seems a little "softer" than when I last saw him.  Extremities: Uses 2 canes to walk. Very limited walking speed. Some difficulty getting on the exam bed.  Neurologic: Grossly intact to motor and sensory function. Psychiatric: Has normal mood and affect. Behavior is normal.  Assessment & Plan  1.  MORBID OBESITY WITH BMI OF 50.0-59.9, ADULT (E66.01)  Plan:  1) For lap sleeve on 10/26/2015  2) Presciptions for oxycodone, zofran, and protonix given to patient.  2.  SLEEP APNEA, OBSTRUCTIVE (G47.33)  Impression: Plan overnight observation in ICU 3.  ESSENTIAL HYPERTENSION (I10) 4.  TYPE 2 DIABETES MELLITUS WITHOUT COMPLICATION, WITHOUT LONG-TERM CURRENT USE OF INSULIN (E11.9)  5. Arthritis 6. Chronic back pain  He goes to the pain management clinic in [facility] - Dr. . But he usually sees the PA, 02-09-1977.  He takes 10 - 20 mg hydrocodone per day, he takes 0 - 10 mg oxycodone per day 7. Spinal cord stimulator  He has had a fusion of L1 - S1.   His last back surgery was around 2006. Dr. 10/21/2015, in [facility] did the surgery. He has since left [place].  He is not currently being followed by a surgeon for his back. He said that the pain specialist have followed this more now. 8. Left hip pain  He is  getting a steroid injection - Dr. 10/28/2015 - these have been held for at least one month  He had a right hip operation by dr. [facility] in 2016.  He is planning left hip surgery after surgery, but this will depend on how he does. He is anxious to have the surgery. 9. Rheumatoid arthritis  Sees Dr. [employer]  On methotrexate - he is now getting a shot q 1 week. This is stopped for now. 10. He has had both knees replaced - 2008 - Drs. Pauley and Weller in [place].   [facility], MD, [facility] Pager: [phone] Office phone:  [phone]

## 2015-10-26 NOTE — Anesthesia Postprocedure Evaluation (Signed)
Anesthesia Post Note  Patient: Anthony Friedman  Procedure(s) Performed: Procedure(s) (LRB): LAPAROSCOPIC GASTRIC SLEEVE RESECTION, UPPER ENDO (N/A)  Patient location during evaluation: PACU Anesthesia Type: General Level of consciousness: awake and alert Pain management: pain level controlled Vital Signs Assessment: post-procedure vital signs reviewed and stable Respiratory status: spontaneous breathing, nonlabored ventilation, respiratory function stable and patient connected to nasal cannula oxygen Cardiovascular status: blood pressure returned to baseline and stable Postop Assessment: no signs of nausea or vomiting Anesthetic complications: no    Last Vitals:  Vitals:   10/26/15 1230 10/26/15 1245  BP:    Pulse:    Resp: 12 12  Temp:      Last Pain:  Vitals:   10/26/15 1300  TempSrc:   PainSc: Asleep                 Phillips Grout

## 2015-10-27 ENCOUNTER — Encounter (HOSPITAL_COMMUNITY): Payer: Self-pay | Admitting: Surgery

## 2015-10-27 DIAGNOSIS — I10 Essential (primary) hypertension: Secondary | ICD-10-CM

## 2015-10-27 DIAGNOSIS — R001 Bradycardia, unspecified: Secondary | ICD-10-CM

## 2015-10-27 LAB — CBC WITH DIFFERENTIAL/PLATELET
BASOS ABS: 0 10*3/uL (ref 0.0–0.1)
BASOS PCT: 0 %
EOS ABS: 0 10*3/uL (ref 0.0–0.7)
EOS PCT: 0 %
HCT: 33.3 % — ABNORMAL LOW (ref 39.0–52.0)
Hemoglobin: 12.2 g/dL — ABNORMAL LOW (ref 13.0–17.0)
Lymphocytes Relative: 6 %
Lymphs Abs: 0.8 10*3/uL (ref 0.7–4.0)
MCH: 36.4 pg — ABNORMAL HIGH (ref 26.0–34.0)
MCHC: 36.6 g/dL — ABNORMAL HIGH (ref 30.0–36.0)
MCV: 99.4 fL (ref 78.0–100.0)
Monocytes Absolute: 0.6 10*3/uL (ref 0.1–1.0)
Monocytes Relative: 4 %
Neutro Abs: 12.7 10*3/uL — ABNORMAL HIGH (ref 1.7–7.7)
Neutrophils Relative %: 90 %
PLATELETS: 300 10*3/uL (ref 150–400)
RBC: 3.35 MIL/uL — AB (ref 4.22–5.81)
RDW: 15 % (ref 11.5–15.5)
WBC: 14.2 10*3/uL — AB (ref 4.0–10.5)

## 2015-10-27 LAB — GLUCOSE, CAPILLARY
GLUCOSE-CAPILLARY: 129 mg/dL — AB (ref 65–99)
GLUCOSE-CAPILLARY: 130 mg/dL — AB (ref 65–99)
GLUCOSE-CAPILLARY: 137 mg/dL — AB (ref 65–99)
Glucose-Capillary: 135 mg/dL — ABNORMAL HIGH (ref 65–99)
Glucose-Capillary: 132 mg/dL — ABNORMAL HIGH (ref 65–99)
Glucose-Capillary: 112 mg/dL — ABNORMAL HIGH (ref 65–99)

## 2015-10-27 LAB — HEMOGLOBIN AND HEMATOCRIT, BLOOD
HCT: 33.9 % — ABNORMAL LOW (ref 39.0–52.0)
HEMOGLOBIN: 12.3 g/dL — AB (ref 13.0–17.0)

## 2015-10-27 MED ORDER — PREMIER PROTEIN SHAKE
2.0000 [oz_av] | ORAL | Status: DC
  Administered 2015-10-27 – 2015-10-28 (×7): 2 [oz_av] via ORAL
  Filled 2015-10-27 (×2): qty 325.31

## 2015-10-27 MED ORDER — ROPINIROLE HCL 1 MG PO TABS
1.0000 mg | ORAL_TABLET | Freq: Every day | ORAL | Status: DC | PRN
  Administered 2015-10-27: 1 mg via ORAL
  Filled 2015-10-27 (×2): qty 1

## 2015-10-27 NOTE — Plan of Care (Signed)
Problem: Food- and Nutrition-Related Knowledge Deficit (NB-1.1) Goal: Nutrition education Formal process to instruct or train a patient/client in a skill or to impart knowledge to help patients/clients voluntarily manage or modify food choices and eating behavior to maintain or improve health. Outcome: Completed/Met Date Met: 10/27/15 Nutrition Education Note  Received consult for diet education per DROP protocol.   Discussed 2 week post op diet with pt. Emphasized that liquids must be non carbonated, non caffeinated, and sugar free. Fluid goals discussed. Reviewed progression of diet to include soft proteins at 7-10 days post-op. Pt to follow up with outpatient bariatric RD for further diet progression after 2 weeks. Multivitamins and minerals also reviewed. Teach back method used, pt expressed understanding, expect good compliance.   Diet: First 2 Weeks  You will see the dietitian about two (2) weeks after your surgery. The dietitian will increase the types of foods you can eat if you are handling liquids well:  If you have severe vomiting or nausea and cannot handle clear liquids lasting longer than 1 day, call your surgeon  Protein Shake  Drink at least 2 ounces of shake 5-6 times per day  Each serving of protein shakes (usually 8 - 12 ounces) should have a minimum of:  15 grams of protein  And no more than 5 grams of carbohydrate  Goal for protein each day:  Men = 80 grams per day  Women = 60 grams per day  Protein powder may be added to fluids such as non-fat milk or Lactaid milk or Soy milk (limit to 35 grams added protein powder per serving)   Hydration  Slowly increase the amount of water and other clear liquids as tolerated (See Acceptable Fluids)  Slowly increase the amount of protein shake as tolerated  Sip fluids slowly and throughout the day  May use sugar substitutes in small amounts (no more than 6 - 8 packets per day; i.e. Splenda)   Fluid Goal  The first goal is to  drink at least 8 ounces of protein shake/drink per day (or as directed by the nutritionist); some examples of protein shakes are [employer], [employer]R [employer], EAS Edge HP, and Unjury. See handout from pre-op Bariatric Education Class:  Slowly increase the amount of protein shake you drink as tolerated  You may find it easier to slowly sip shakes throughout the day  It is important to get your proteins in first  Your fluid goal is to drink 64 - 100 ounces of fluid daily  It may take a few weeks to build up to this  32 oz (or more) should be clear liquids  And  32 oz (or more) should be full liquids (see below for examples)  Liquids should not contain sugar, caffeine, or carbonation   Clear Liquids:  Water or Sugar-free flavored water (i.e. Fruit H2O, Propel)  Decaffeinated coffee or tea (sugar-free)  Crystal Lite, Wyler's Lite, Minute Maid Lite  Sugar-free Jell-O  Bouillon or broth  Sugar-free Popsicle: *Less than 20 calories each; Limit 1 per day   Full Liquids:  Protein Shakes/Drinks + 2 choices per day of other full liquids  Full liquids must be:  No More Than 12 grams of Carbs per serving  No More Than 3 grams of Fat per serving  Strained low-fat cream soup  Non-Fat milk  Fat-free Lactaid Milk  Sugar-free yogurt (Dannon Lite & Fit, Greek yogurt)     Clayton Bibles, MS, RD, LDN Pager: [phone] After Hours Pager: [phone]

## 2015-10-27 NOTE — Progress Notes (Addendum)
Notified on call MD Dr. Abbey Chatters  in regards to heart of 40-30 patient asymptomatic.  No new orders at this time, and continue to monitor. Inform Dr. Ezzard Standing in am.

## 2015-10-27 NOTE — Progress Notes (Signed)
Patient alert and oriented, Post op day 1.  Provided support and encouragement.  Encouraged pulmonary toilet, ambulation and small sips of liquids.  All questions answered.  Patient having significant issues with restless legs, spoke with Dr. Ezzard Standing and orders received to restart home Requip.  Will continue to monitor.

## 2015-10-27 NOTE — Progress Notes (Addendum)
[facility] Office:  [phone] General Surgery Progress Note   LOS: 1 day  POD -  1 Day Post-Op  Assessment/Plan: 1.  LAPAROSCOPIC GASTRIC SLEEVE RESECTION, UPPER ENDO - 10/27/2015 - D. Kary Sugrue  MORBID OBESITY WITH BMI OF 50.0-59.9, ADULT (E66.01)  Doing okay, though he has "gas" pains  To start protein drinks 2.  SLEEP APNEA, OBSTRUCTIVE (G47.33)  Move to floor                      3.  ESSENTIAL HYPERTENSION (I10)  Hold BP meds for now because BP is okay 4.  TYPE 2 DIABETES MELLITUS WITHOUT COMPLICATION, WITHOUT LONG-TERM CURRENT USE OF INSULIN (E11.9)  5.  Sinus bradycardia  His preop EKG showed a rate in the 50's.  Will check EKG. 6. Arthritis 7. Chronic back pain                 He goes to the pain management clinic in [facility] - Dr. Naoma Diener. But he usually sees the PA, [employer].                  He takes 10 - 20 mg hydrocodone per day, he takes 0 - 10 mg oxycodone per day 8. Spinal cord stimulator                 He has had a fusion of L1 - S1.                  His last back surgery was around 2006. Dr. Veronia Beets, in [facility] did the surgery. He has since left [place].                 He is not currently being followed by a surgeon for his back. He said that the pain specialist have followed this more now. 9. Left hip pain  He is getting a steroid injection - Dr. Maureen Ralphs - these have been held for at least one month  He had a right hip operation by dr. Maureen Ralphs in 2016. 10. Rheumatoid arthritis                  Sees Dr. Dierdre Forth                  On methotrexate - he is now getting a shot q 1 week. This is stopped for now.  11.  DVT prophylaxis - SQ Heparin   Active Problems:   Morbid obesity (HCC)   Subjective:  Doing okay.  Main abdominal complaint is "gas pains", but took water okay. Also "restless legs" irriitated him last PM.  Objective:   Vitals:   10/27/15 0724  10/27/15 0800  BP: (!) 113/49   Pulse: (!) 35   Resp: 15   Temp:  98 F (36.7 C)     Intake/Output from previous day:  08/21 0701 - 08/22 0700 In: 4052.9 [P.O.:30; I.V.:4022.9] Out: 1050 [Urine:1000; Blood:50]  Intake/Output this shift:  No intake/output data recorded.   Physical Exam:   General: Obese WM who is alert and oriented. Sitting in chair.   HEENT: Normal. Pupils equal. .   Lungs: Clear.  IS - 1,100 cc   Abdomen: Large.  Rare BS   Wound: Clean   Lab Results:    Recent Labs  10/26/15 0829 10/26/15 1731 10/27/15 0326  WBC 11.8*  --  14.2*  HGB 12.7* 12.5* 12.2*  HCT 35.4*  33.8* 33.3*  PLT 351  --  300    BMET   Recent Labs  10/26/15 0829  NA 134*  K 3.6  CL 96*  CO2 33*  GLUCOSE 114*  BUN 44*  CREATININE 0.88  CALCIUM 9.3    PT/INR  No results for input(s): LABPROT, INR in the last 72 hours.  ABG  No results for input(s): PHART, HCO3 in the last 72 hours.  Invalid input(s): PCO2, PO2   Studies/Results:  No results found.   Anti-infectives:   Anti-infectives    Start     Dose/Rate Route Frequency Ordered Stop   10/26/15 0811  cefoTEtan in Dextrose 5% (CEFOTAN) IVPB 2 g     2 g Intravenous On call to O.R. 10/26/15 0811 10/26/15 1011      Ovidio Kin, MD, FACS Pager: [phone] [facility] Office: [phone] 10/27/2015

## 2015-10-27 NOTE — Consult Note (Signed)
CARDIOLOGY CONSULT NOTE   Patient ID: Anthony Friedman MRN: [id number], DOB/AGE: [birth year]   Admit date: 10/26/2015 Date of Consult: 10/27/2015  Primary Physician: Lizbeth Bark, NP Primary Cardiologist: New patient  Reason for consult:  Bradycardia  Problem List  Past Medical History:  Diagnosis Date  . Atelectasis   . Chronic allergic rhinitis   . Chronic venous insufficiency    superficial  . COPD (chronic obstructive pulmonary disease) (HCC)   . Cough   . Diabetes mellitus without complication (HCC)   . Edema    lower extremities  . Elevated liver enzymes    d/t arava  . Family history of adverse reaction to anesthesia    pts mother had difficulty with anesthesia - pt not sure what difficulties were   . Hypertension   . Kidney stones    hx of   . Leg pain   . Liver enlargement    d/t arava  . Obesity   . Pallor   . Pneumonia    hx of times 3; pt states is current with pneumonia vaccine  . RA (rheumatoid arthritis) (HCC)     rheumatoid arthritis   . Sleep apnea    cpap   . SOBOE (shortness of breath on exertion)   . Superficial thrombophlebitis    subacute-on anticoagulation  . Urinary frequency   . Urinary tract bacterial infections    hx of   . Venous stasis    changes    Past Surgical History:  Procedure Laterality Date  . HAND SURGERY  1965   right hand  . KNEE SURGERY     arthroscopic right and left knee  . LAMINECTOMY  1988   and fusion; L4, L5, S1  . LAMINECTOMY  2007   and fusion L1, L2, L3  . LAPAROSCOPIC GASTRIC SLEEVE RESECTION N/A 10/26/2015   Procedure: LAPAROSCOPIC GASTRIC SLEEVE RESECTION, UPPER ENDO;  Surgeon: Ovidio Kin, MD;  Location: [facility];  Service: General;  Laterality: N/A;  . OTHER SURGICAL HISTORY     polynidal cyst  . REPLACEMENT TOTAL KNEE  2009   left 2009; right 2014  . SPINAL CORD STIMULATOR IMPLANT     to control back pain   . TOTAL HIP ARTHROPLASTY Right 08/08/2014   Procedure: RIGHT TOTAL HIP ARTHROPLASTY  ANTERIOR APPROACH;  Surgeon: Kathryne Hitch, MD;  Location: [facility];  Service: Orthopedics;  Laterality: Right;  . UMBILICAL HERNIA REPAIR       Allergies  Allergies  Allergen Reactions  . Sulfa Antibiotics     Blood in urine    HPI   This is a pleasant 65 year old gentleman with history of morbid obesity with 330 pounds, insulin dependent diabetes mellitus, hypertension, obstructive sleep apnea on CPAP machine at home, stage III COPD who underwent bariatric surgery with gastric sleeve placement yesterday. Surgery and postop course had been uncomplicated, however we were consulted for profound bradycardia that was observed on telemetry starting last night. The patient states that he is completely asymptomatic he denies any dizziness or lightheadedness, he was able to walk with physical therapist today and his heart rate increased to 60'. His blood pressure is well controlled. He was asymptomatic while walking as well. He has no prior history of heart disease. He denies any orthopnea, paroxysmal nocturnal dyspnea no chest pain or palpitations.  Inpatient Medications  . heparin subcutaneous  5,000 Units Subcutaneous Q8H  . insulin aspart  0-20 Units Subcutaneous Q4H  . protein supplement shake  2 oz  Oral Q2H    Family History Family History  Problem Relation Age of Onset  . Heart attack Father   . Esophageal cancer Mother   . Other Mother     Tachycardia  . Diabetes Brother      Social History Social History   Social History  . Marital status: Married    Spouse name: N/A  . Number of children: 4  . Years of education: N/A   Occupational History  . disabled      former Chief Financial Officer, office work    Social History Main Topics  . Smoking status: Former Smoker    Packs/day: 2.00    Years: 45.00    Types: Cigarettes    Quit date: 03/08/2007  . Smokeless tobacco: Never Used  . Alcohol use No     Comment: recovering alcoholic - none since 2012   . Drug use: No       Comment: hx of marijuana in teenage years  . Sexual activity: Not on file   Other Topics Concern  . Not on file   Social History Narrative  . No narrative on file     Review of Systems  General:  No chills, fever, night sweats or weight changes.  Cardiovascular:  No chest pain, dyspnea on exertion, edema, orthopnea, palpitations, paroxysmal nocturnal dyspnea. Dermatological: No rash, lesions/masses Respiratory: No cough, dyspnea Urologic: No hematuria, dysuria Abdominal:   No nausea, vomiting, diarrhea, bright red blood per rectum, melena, or hematemesis Neurologic:  No visual changes, wkns, changes in mental status. All other systems reviewed and are otherwise negative except as noted above.  Physical Exam  Blood pressure (!) 126/48, pulse (!) 37, temperature 97.6 F (36.4 C), temperature source Oral, resp. rate 14, height 5\' 8"  (1.727 m), weight (!) 332 lb 2 oz (150.7 kg), SpO2 100 %.  General: Pleasant, Morbidly obese, in mild discomfort for secondary to epigastric pain. Psych: Normal affect. Neuro: Alert and oriented X 3. Moves all extremities spontaneously. HEENT: Normal  Neck: Supple without bruits or JVD. Lungs:  Resp regular and unlabored, CTA. Heart: RRR no s3, s4, or murmurs. Abdomen: Soft, non-tender, non-distended, BS + x 4.  Extremities: No clubbing, cyanosis or edema. DP/PT/Radials 2+ and equal bilaterally.  Labs  No results for input(s): CKTOTAL, CKMB, TROPONINI in the last 72 hours. Lab Results  Component Value Date   WBC 14.2 (H) 10/27/2015   HGB 12.2 (L) 10/27/2015   HCT 33.3 (L) 10/27/2015   MCV 99.4 10/27/2015   PLT 300 10/27/2015    Recent Labs Lab 10/26/15 0829  NA 134*  K 3.6  CL 96*  CO2 33*  BUN 44*  CREATININE 0.88  CALCIUM 9.3  PROT 7.0  BILITOT 0.7  ALKPHOS 72  ALT 19  AST 21  GLUCOSE 114*   Radiology/Studies  ECG: Marked sinus bradycardia with sinus arrhythmia, low-voltage EKG, other than more profound bradycardia  disease unchanged from 08/05/2014.  Telemetry: Sinus bradycardia down to 36 bpm, no AV block no pauses, no arrhythmias.    ASSESSMENT AND PLAN  65 year old gentleman post bariatric surgery on 10/26/2015  1. Profound sinus bradycardia, completely asymptomatic, no evidence of AV block or sinus pauses in the last 24 hours, patient's baseline EKG a year ago shows bradycardia in 50s, currently down to upper 30s. For now I would just service since his blood pressures well-maintained. He has taken the last dose of atenolol off Sunday it is a long acting beta blocker and might take up  to 72 hours to washout. We will order an echocardiogram to evaluate for his LV systolic and diastolic function. His potassium is slightly low we will replace, his calcium is normal we will check magnesium.  2. Essential hypertension - well controlled   Signed, Tobias Alexander, MD, [facility] 10/27/2015, 4:02 PM

## 2015-10-28 ENCOUNTER — Inpatient Hospital Stay (HOSPITAL_COMMUNITY): Payer: Medicare HMO

## 2015-10-28 DIAGNOSIS — R001 Bradycardia, unspecified: Secondary | ICD-10-CM

## 2015-10-28 DIAGNOSIS — I4891 Unspecified atrial fibrillation: Secondary | ICD-10-CM

## 2015-10-28 LAB — CBC WITH DIFFERENTIAL/PLATELET
Basophils Absolute: 0 10*3/uL (ref 0.0–0.1)
Basophils Relative: 0 %
Eosinophils Absolute: 0 10*3/uL (ref 0.0–0.7)
Eosinophils Relative: 0 %
HCT: 33.5 % — ABNORMAL LOW (ref 39.0–52.0)
Hemoglobin: 12.1 g/dL — ABNORMAL LOW (ref 13.0–17.0)
Lymphocytes Relative: 10 %
Lymphs Abs: 1.8 10*3/uL (ref 0.7–4.0)
MCH: 37.1 pg — ABNORMAL HIGH (ref 26.0–34.0)
MCHC: 36.1 g/dL — ABNORMAL HIGH (ref 30.0–36.0)
MCV: 102.8 fL — ABNORMAL HIGH (ref 78.0–100.0)
Monocytes Absolute: 1.5 10*3/uL — ABNORMAL HIGH (ref 0.1–1.0)
Monocytes Relative: 8 %
Neutro Abs: 14.9 10*3/uL — ABNORMAL HIGH (ref 1.7–7.7)
Neutrophils Relative %: 82 %
Platelets: 319 10*3/uL (ref 150–400)
RBC: 3.26 MIL/uL — ABNORMAL LOW (ref 4.22–5.81)
RDW: 15.3 % (ref 11.5–15.5)
WBC: 18.2 10*3/uL — ABNORMAL HIGH (ref 4.0–10.5)

## 2015-10-28 LAB — ECHOCARDIOGRAM COMPLETE
Height: 68 in
Weight: 5314 [oz_av]

## 2015-10-28 LAB — GLUCOSE, CAPILLARY
GLUCOSE-CAPILLARY: 109 mg/dL — AB (ref 65–99)
Glucose-Capillary: 110 mg/dL — ABNORMAL HIGH (ref 65–99)
Glucose-Capillary: 102 mg/dL — ABNORMAL HIGH (ref 65–99)
Glucose-Capillary: 108 mg/dL — ABNORMAL HIGH (ref 65–99)

## 2015-10-28 LAB — MAGNESIUM: Magnesium: 1.8 mg/dL (ref 1.7–2.4)

## 2015-10-28 MED ORDER — ROPINIROLE HCL 1 MG PO TABS: 1.0000 mg | ORAL_TABLET | Freq: Every day | ORAL | Status: DC | PRN

## 2015-10-28 MED ORDER — TAMSULOSIN HCL 0.4 MG PO CAPS
0.4000 mg | ORAL_CAPSULE | Freq: Every morning | ORAL | Status: DC
  Administered 2015-10-28: 0.4 mg via ORAL
  Filled 2015-10-28: qty 1

## 2015-10-28 MED ORDER — MAGNESIUM GLUCONATE 500 MG PO TABS
500.0000 mg | ORAL_TABLET | Freq: Once | ORAL | Status: AC
  Administered 2015-10-28: 500 mg via ORAL
  Filled 2015-10-28: qty 1

## 2015-10-28 MED ORDER — PERFLUTREN LIPID MICROSPHERE
INTRAVENOUS | Status: AC
  Filled 2015-10-28: qty 10

## 2015-10-28 MED ORDER — PERFLUTREN LIPID MICROSPHERE
1.0000 mL | INTRAVENOUS | Status: AC | PRN
  Administered 2015-10-28: 2 mL via INTRAVENOUS
  Filled 2015-10-28: qty 10

## 2015-10-28 NOTE — Progress Notes (Addendum)
Hospital Problem List     Principal Problem:   Morbid obesity (HCC) Active Problems:   Bradycardia    Patient Profile:   Primary Cardiologist: New - Dr. Delton See  65 yo male w/ PMH of morbid obesity, IDDM, HTN, OSA (on CPAP), and stage 3 CKD admitted for bariatric surgery. Cardiology consulted for post-op bradycardia with HR in the 30's.   Subjective   HR improved. Ambulating around the unit this AM with mild abdominal pain. No pre-syncope or dizziness.   Inpatient Medications    . heparin subcutaneous  5,000 Units Subcutaneous Q8H  . insulin aspart  0-20 Units Subcutaneous Q4H  . protein supplement shake  2 oz Oral Q2H  . tamsulosin  0.4 mg Oral q morning - 10a    Vital Signs    Vitals:   10/28/15 0400 10/28/15 0500 10/28/15 0800 10/28/15 0805  BP:  126/65  139/62  Pulse:  (!) 54 61 66  Resp:  16 17 (!) 21  Temp: 98.1 F (36.7 C)  98.1 F (36.7 C)   TempSrc: Axillary  Oral   SpO2:  99% 94% 92%  Weight:      Height:        Intake/Output Summary (Last 24 hours) at 10/28/15 1048 Last data filed at 10/28/15 0900  Gross per 24 hour  Intake             2945 ml  Output             1300 ml  Net             1645 ml   Filed Weights   10/26/15 0822  Weight: (!) 332 lb 2 oz (150.7 kg)    Physical Exam    General: Morbidly obese  male appearing in no acute distress. Head: Normocephalic, atraumatic.  Neck: Supple without bruits, unable to be assessed secondary to body habitus. Lungs:  Resp regular and unlabored, CTA without wheezing or rales. Heart: RRR, S1, S2, no S3, S4, or murmur; no rub. Abdomen: Soft, non-tender, non-distended with normoactive bowel sounds. No hepatomegaly. No rebound/guarding. No obvious abdominal masses. Extremities: No clubbing, cyanosis, or edema. Distal pedal pulses are 2+ bilaterally. Varicose veins present.  Neuro: Alert and oriented X 3. Moves all extremities spontaneously. Psych: Normal affect.  Labs    CBC  Recent Labs  10/27/15 0326 10/27/15 1609 10/28/15 0323  WBC 14.2*  --  18.2*  NEUTROABS 12.7*  --  14.9*  HGB 12.2* 12.3* 12.1*  HCT 33.3* 33.9* 33.5*  MCV 99.4  --  102.8*  PLT 300  --  319   Basic Metabolic Panel  Recent Labs  10/26/15 0829 10/28/15 0323  NA 134*  --   K 3.6  --   CL 96*  --   CO2 33*  --   GLUCOSE 114*  --   BUN 44*  --   CREATININE 0.88  --   CALCIUM 9.3  --   MG  --  1.8   Liver Function Tests  Recent Labs  10/26/15 0829  AST 21  ALT 19  ALKPHOS 72  BILITOT 0.7  PROT 7.0  ALBUMIN 4.1    Telemetry    Sinus bradycardia, HR in high-40's - 50's.   ECG    Sinus bradycardia, HR 40, with no acute ST or T-wave changes.    Cardiac Studies and Radiology    US Abdomen Complete  Result Date: 10/12/2015 CLINICAL DATA:  Morbid obesity. Hepatomegaly. History  of hypertension. Scratch the history hypertension and kidney stones. EXAM: ABDOMEN ULTRASOUND COMPLETE COMPARISON:  None. FINDINGS: Gallbladder: No gallstones or wall thickening visualized. No sonographic Murphy sign noted by sonographer. Common bile duct: Diameter: 0.5 cm Liver: No focal lesion. Coarsened echotexture and increased echogenicity are identified. IVC: No abnormality visualized. Pancreas: Visualized portion unremarkable. Spleen: Size and appearance within normal limits. Right Kidney: Length: 12.7 cm. Echogenicity within normal limits. Simple cyst in the upper pole measures 3.2 cm in diameter. No hydronephrosis visualized. Left Kidney: Length: 1.8 cm. Echogenicity within normal limits. No mass or hydronephrosis visualized. Abdominal aorta: No aneurysm visualized. Other findings: None. IMPRESSION: No acute abnormality. Fatty infiltration of the liver. 3.2 cm simple right renal cyst and Electronically Signed   By: Drusilla Kanner M.D.   On: 10/12/2015 10:13   Dg Ugi  W/kub  Result Date: 10/12/2015 CLINICAL DATA:  Pre EXAM: UPPER GI SERIES WITH KUB TECHNIQUE: After obtaining a scout radiograph a routine  upper GI series was performed using thin barium FLUOROSCOPY TIME:  Radiation Exposure Index (as provided by the fluoroscopic device): 57.9 mGy COMPARISON:  None. FINDINGS: On the scout radiograph there is a spinal stimulator with leads projecting over the lower thoracic spine. Postsurgical changes within the lumbar spine noted. The esophagus appears normal. The stomach, duodenal bulb and sweep have a normal anatomy. No hiatal hernia identified.  No reflux. IMPRESSION: 1. Unremarkable upper GI. Electronically Signed   By: Signa Kell M.D.   On: 10/12/2015 10:37   Echocardiogram: Pending   Assessment & Plan    1. Sinus bradycardia - completely asymptomatic, no evidence of AV block or sinus pauses in the last 24 hours - HR has been in the high-40's - 50's overnight. Ambulating at this time with HR in the 90's.  - last dose of Atenolol was Sunday and it can take up to 72 hours to washout. - echocardiogram pending to evaluate LV systolic and diastolic function (order had not been entered yesterday, added this AM as an anticipated discharge). If no significant LV dysfunction, would likely be stable for discharge later today from a Cardiology perspective (d/c orders entered by Surgery). Had echo in 2015 in Care Everywhere but results not available for review.    2. Essential hypertension  - well-controlled. Was on Atenolol-Chlorthalidone 100-25mg  daily, Losartan 50mg  daily, and Lasix 20mg   daily prior to admission. - hold Atenolol-Chlorthalidone combo at time of discharge as he was on dual-diuretic therapy which is not ideal. Discussed with Bariatric coordinator and she feels with his decreased PO intake secondary to bariatric surgery, this could potentially be troublesome.   - has follow-up with PCP next week. Encouraged to keep a log of HR and BP readings until then. Will add Cardiology contact number to AVS and we can be available if needed.  , PA-C 10:48  AM 10/28/2015 Pager: [phone]  The patient was seen, examined and discussed with 10/30/2015, PA-C and I agree with the above.    65 year old gentleman post bariatric surgery on 10/26/2015 with profound sinus bradycardia post surgery, that  is improving, now in 50-60 range, he remains asymptomatic, I would continue holding atenolol and continue monitoring HR with increased activity level. Bedside echo shows normal LVEF, no obvious regional wall motion abnormalities. Echo is still pending, we will follow him. BP is well controlled of atenolol.  Electrolytes are normal.   From cardiac standpoint he can be discharged, I would continue holding atenolol, we will arrange for  an outpatient follow up in 1 week, where we will readjust his meds as needed for BP, HR control  Tobias Alexander 10/28/2015

## 2015-10-28 NOTE — Progress Notes (Signed)
Date: October 28, 2015 Discharge orders checked for needs. No needs present at time of discharge. Laury Huizar, RN, BSN, CCM   [phone] 

## 2015-10-28 NOTE — Discharge Summary (Signed)
Physician Discharge Summary  Patient ID:  Anthony Friedman  MRN: [id number]  DOB/AGE: 64-Dec-1952 64 y.o.  Admit date: 10/26/2015 Discharge date: 10/28/2015  Discharge Diagnoses:   1. MORBID OBESITY WITH BMI OF 50.0-59.9, ADULT (E66.01)  Weight 328, BMI of 51.5)  2. SLEEP APNEA, OBSTRUCTIVE (G47.33)  3. ESSENTIAL HYPERTENSION (I10) 4. TYPE 2 DIABETES MELLITUS WITHOUT COMPLICATION, WITHOUT LONG-TERM CURRENT USE OF INSULIN (E11.9)  5.  Sinus bradycardia 6. Arthritis 7. Chronic back pain He goes to the pain management clinic in [facility] - Dr. Naoma Diener. But he usually sees the PA, [employer]. He takes 10 - 20 mg hydrocodone per day, he takes 0 - 10 mg oxycodone per day 8. Spinal cord stimulator He has had a fusion of L1 - S1.  His last back surgery was around 2006. Dr. Veronia Beets, in [facility] did the surgery. He has since left [place]. He is not currently being followed by a surgeon for his back. He said that the pain specialist have followed this more now. 9. Left hip pain                       He is getting a steroid injection - Dr. Maureen Ralphs - these have been held for at least one month                       He had a right hip operation by dr. Maureen Ralphs in 2016. 10. Rheumatoid arthritis Sees Dr. Dierdre Forth On methotrexate - he is now getting a shot q 1 week. This is stopped for now.   Principal Problem:   Morbid obesity (HCC) Active Problems:   Bradycardia   Operation: Procedure(s):  LAPAROSCOPIC GASTRIC SLEEVE RESECTION, UPPER ENDO on 10/26/2015 - D. Ezzard Standing  Discharged Condition: good  Hospital Course: [name] is an 65 y.o. male whose primary care physician is Smothers, Cathleen Corti, NP and who was admitted 10/26/2015 with a chief complaint of Morbid obesity.  He was brought to the operating  room on 10/26/2015 and underwent  LAPAROSCOPIC GASTRIC SLEEVE RESECTION, UPPER ENDO.   Because of his complex medical history, he was kept in the step down ICU for 2 days. He was bradycardic pre op, but post op, became even more bradycardic to a rate in the high 30's and low 40's.  I asked cardilogy to see him. He was seen by Dr. Eloy End, who thought the bradycardia related somewhat to his beta blockers and was benign.  His beta blockers were held in the hospital.  She did order an echocardiogram, which show a good EF with no ventricular wall irregularity.  The discharge instructions were reviewed with the patient.  Consults: cardiology  Significant Diagnostic Studies: Results for orders placed or performed during the hospital encounter of 10/26/15  CBC WITH DIFFERENTIAL  Result Value Ref Range   WBC 11.8 (H) 4.0 - 10.5 K/uL   RBC 3.58 (L) 4.22 - 5.81 MIL/uL   Hemoglobin 12.7 (L) 13.0 - 17.0 g/dL   HCT 97.2 (L) 82.0 - 60.1 %   MCV 98.9 78.0 - 100.0 fL   MCH 35.5 (H) 26.0 - 34.0 pg   MCHC 35.9 30.0 - 36.0 g/dL   RDW 56.1 53.7 - 94.3 %   Platelets 351 150 - 400 K/uL   Neutrophils Relative % 76 %   Neutro Abs 9.0 (H) 1.7 - 7.7 K/uL   Lymphocytes Relative 12 %   Lymphs  Abs 1.4 0.7 - 4.0 K/uL   Monocytes Relative 9 %   Monocytes Absolute 1.1 (H) 0.1 - 1.0 K/uL   Eosinophils Relative 3 %   Eosinophils Absolute 0.4 0.0 - 0.7 K/uL   Basophils Relative 0 %   Basophils Absolute 0.0 0.0 - 0.1 K/uL  Comprehensive metabolic panel  Result Value Ref Range   Sodium 134 (L) 135 - 145 mmol/L   Potassium 3.6 3.5 - 5.1 mmol/L   Chloride 96 (L) 101 - 111 mmol/L   CO2 33 (H) 22 - 32 mmol/L   Glucose, Bld 114 (H) 65 - 99 mg/dL   BUN 44 (H) 6 - 20 mg/dL   Creatinine, Ser 8.11 0.61 - 1.24 mg/dL   Calcium 9.3 8.9 - 91.4 mg/dL   Total Protein 7.0 6.5 - 8.1 g/dL   Albumin 4.1 3.5 - 5.0 g/dL   AST 21 15 - 41 U/L   ALT 19 17 - 63 U/L   Alkaline Phosphatase 72 38 - 126 U/L   Total Bilirubin 0.7 0.3  - 1.2 mg/dL   GFR calc non Af Amer >60 >60 mL/min   GFR calc Af Amer >60 >60 mL/min   Anion gap 5 5 - 15  Glucose, capillary  Result Value Ref Range   Glucose-Capillary 116 (H) 65 - 99 mg/dL   Comment 1 Notify RN   Glucose, capillary  Result Value Ref Range   Glucose-Capillary 196 (H) 65 - 99 mg/dL  Hemoglobin and hematocrit, blood  Result Value Ref Range   Hemoglobin 12.5 (L) 13.0 - 17.0 g/dL   HCT 78.2 (L) 95.6 - 21.3 %  Glucose, capillary  Result Value Ref Range   Glucose-Capillary 146 (H) 65 - 99 mg/dL  CBC WITH DIFFERENTIAL  Result Value Ref Range   WBC 14.2 (H) 4.0 - 10.5 K/uL   RBC 3.35 (L) 4.22 - 5.81 MIL/uL   Hemoglobin 12.2 (L) 13.0 - 17.0 g/dL   HCT 08.6 (L) 57.8 - 46.9 %   MCV 99.4 78.0 - 100.0 fL   MCH 36.4 (H) 26.0 - 34.0 pg   MCHC 36.6 (H) 30.0 - 36.0 g/dL   RDW 62.9 52.8 - 41.3 %   Platelets 300 150 - 400 K/uL   Neutrophils Relative % 90 %   Neutro Abs 12.7 (H) 1.7 - 7.7 K/uL   Lymphocytes Relative 6 %   Lymphs Abs 0.8 0.7 - 4.0 K/uL   Monocytes Relative 4 %   Monocytes Absolute 0.6 0.1 - 1.0 K/uL   Eosinophils Relative 0 %   Eosinophils Absolute 0.0 0.0 - 0.7 K/uL   Basophils Relative 0 %   Basophils Absolute 0.0 0.0 - 0.1 K/uL  Glucose, capillary  Result Value Ref Range   Glucose-Capillary 159 (H) 65 - 99 mg/dL   Comment 1 Notify RN    Comment 2 Document in Chart   Hemoglobin and hematocrit, blood  Result Value Ref Range   Hemoglobin 12.3 (L) 13.0 - 17.0 g/dL   HCT 24.4 (L) 01.0 - 27.2 %  Glucose, capillary  Result Value Ref Range   Glucose-Capillary 129 (H) 65 - 99 mg/dL   Comment 1 Notify RN    Comment 2 Document in Chart   Glucose, capillary  Result Value Ref Range   Glucose-Capillary 135 (H) 65 - 99 mg/dL  Glucose, capillary  Result Value Ref Range   Glucose-Capillary 137 (H) 65 - 99 mg/dL   Comment 1 Notify RN    Comment 2 Document in  Chart   Glucose, capillary  Result Value Ref Range   Glucose-Capillary 132 (H) 65 - 99 mg/dL   Glucose, capillary  Result Value Ref Range   Glucose-Capillary 130 (H) 65 - 99 mg/dL  Magnesium  Result Value Ref Range   Magnesium 1.8 1.7 - 2.4 mg/dL  CBC with Differential  Result Value Ref Range   WBC 18.2 (H) 4.0 - 10.5 K/uL   RBC 3.26 (L) 4.22 - 5.81 MIL/uL   Hemoglobin 12.1 (L) 13.0 - 17.0 g/dL   HCT 84.1 (L) 32.4 - 40.1 %   MCV 102.8 (H) 78.0 - 100.0 fL   MCH 37.1 (H) 26.0 - 34.0 pg   MCHC 36.1 (H) 30.0 - 36.0 g/dL   RDW 02.7 25.3 - 66.4 %   Platelets 319 150 - 400 K/uL   Neutrophils Relative % 82 %   Lymphocytes Relative 10 %   Monocytes Relative 8 %   Eosinophils Relative 0 %   Basophils Relative 0 %   Neutro Abs 14.9 (H) 1.7 - 7.7 K/uL   Lymphs Abs 1.8 0.7 - 4.0 K/uL   Monocytes Absolute 1.5 (H) 0.1 - 1.0 K/uL   Eosinophils Absolute 0.0 0.0 - 0.7 K/uL   Basophils Absolute 0.0 0.0 - 0.1 K/uL   Smear Review MORPHOLOGY UNREMARKABLE   Glucose, capillary  Result Value Ref Range   Glucose-Capillary 112 (H) 65 - 99 mg/dL   Comment 1 Notify RN    Comment 2 Document in Chart   Glucose, capillary  Result Value Ref Range   Glucose-Capillary 110 (H) 65 - 99 mg/dL   Comment 1 Notify RN    Comment 2 Document in Chart   Glucose, capillary  Result Value Ref Range   Glucose-Capillary 109 (H) 65 - 99 mg/dL  Glucose, capillary  Result Value Ref Range   Glucose-Capillary 102 (H) 65 - 99 mg/dL  Glucose, capillary  Result Value Ref Range   Glucose-Capillary 108 (H) 65 - 99 mg/dL  ECHOCARDIOGRAM COMPLETE  Result Value Ref Range   Weight 5,314 oz   Height 68 in   BP 139/62 mmHg    US Abdomen Complete  Result Date: 10/12/2015 CLINICAL DATA:  Morbid obesity. Hepatomegaly. History of hypertension. Scratch the history hypertension and kidney stones. EXAM: ABDOMEN ULTRASOUND COMPLETE COMPARISON:  None. FINDINGS: Gallbladder: No gallstones or wall thickening visualized. No sonographic Murphy sign noted by sonographer. Common bile duct: Diameter: 0.5 cm Liver: No focal lesion.  Coarsened echotexture and increased echogenicity are identified. IVC: No abnormality visualized. Pancreas: Visualized portion unremarkable. Spleen: Size and appearance within normal limits. Right Kidney: Length: 12.7 cm. Echogenicity within normal limits. Simple cyst in the upper pole measures 3.2 cm in diameter. No hydronephrosis visualized. Left Kidney: Length: 1.8 cm. Echogenicity within normal limits. No mass or hydronephrosis visualized. Abdominal aorta: No aneurysm visualized. Other findings: None. IMPRESSION: No acute abnormality. Fatty infiltration of the liver. 3.2 cm simple right renal cyst and Electronically Signed   By: Drusilla Kanner M.D.   On: 10/12/2015 10:13   Dg Ugi  W/kub  Result Date: 10/12/2015 CLINICAL DATA:  Pre EXAM: UPPER GI SERIES WITH KUB TECHNIQUE: After obtaining a scout radiograph a routine upper GI series was performed using thin barium FLUOROSCOPY TIME:  Radiation Exposure Index (as provided by the fluoroscopic device): 57.9 mGy COMPARISON:  None. FINDINGS: On the scout radiograph there is a spinal stimulator with leads projecting over the lower thoracic spine. Postsurgical changes within the lumbar spine noted. The esophagus appears  normal. The stomach, duodenal bulb and sweep have a normal anatomy. No hiatal hernia identified.  No reflux. IMPRESSION: 1. Unremarkable upper GI. Electronically Signed   By: Signa Kell M.D.   On: 10/12/2015 10:37    Discharge Exam:  Vitals:   10/28/15 0805 10/28/15 1156  BP: 139/62   Pulse: 66   Resp: (!) 21   Temp:  98.1 F (36.7 C)    General: Obese WM who is alert. Lungs: Clear to auscultation and symmetric breath sounds. Heart:  Bradycardia, though pulse in the 60's now. No murmur or rub. Abdomen: Obese.  Normal bowel sounds.  Wounds okay. Extremities:  Has left hip pain and pain on movement.   Discharge Medications:   Resume home meds. He has prescriptions for oxycodone, Protonix, and Zofran  Disposition: 03-Skilled  Nursing Facility  Discharge Instructions    Ambulate hourly while awake    Complete by:  As directed   Call MD for:  difficulty breathing, headache or visual disturbances    Complete by:  As directed   Call MD for:  persistant dizziness or light-headedness    Complete by:  As directed   Call MD for:  persistant nausea and vomiting    Complete by:  As directed   Call MD for:  redness, tenderness, or signs of infection (pain, swelling, redness, odor or green/yellow discharge around incision site)    Complete by:  As directed   Call MD for:  severe uncontrolled pain    Complete by:  As directed   Call MD for:  temperature >101 F    Complete by:  As directed   Diet bariatric full liquid    Complete by:  As directed   Incentive spirometry    Complete by:  As directed   Perform hourly while awake      Follow-up Information    Tobias Alexander, MD .   Specialty:  Cardiology Why:  As needed. Contact information: [address] [place] [address] [phone]            Signed: Ovidio Kin, M.D., [facility] Surgery Office:  [phone]  10/28/2015, 1:08 PM

## 2015-10-28 NOTE — Discharge Instructions (Signed)

## 2015-10-28 NOTE — Progress Notes (Signed)
[facility]:  [phone] General Surgery Progress Note   LOS: 2 days  POD -  2 Days Post-Op  Assessment/Plan: 1.  LAPAROSCOPIC GASTRIC SLEEVE RESECTION, UPPER ENDO - 10/27/2015 - D. Wiley Flicker  MORBID OBESITY WITH BMI OF 50.0-59.9, ADULT (E66.01)  Still having "gas" pains in epigastrium, but taking water well and took protein drink yesterday  He would like to go home, which I think is okay  2.  SLEEP APNEA, OBSTRUCTIVE (G47.33)                    3.  ESSENTIAL HYPERTENSION (I10)  Hold BP meds for now because BP is okay 4.  TYPE 2 DIABETES MELLITUS WITHOUT COMPLICATION, WITHOUT LONG-TERM CURRENT USE OF INSULIN (E11.9)  5.  Sinus bradycardia - better today.  Appreciate Dr. Lindaann Slough help.  Questions:  1.  You discussed an echo in your note, but did not order it?  Does he need this prior to discharge.   2.  Pulse better today.  Do we continue to hold beta blocker or restart it?   3.  Does he need follow up with cardiology long term? 6. Arthritis 7. Chronic back pain                 He goes to the pain management clinic in [facility] - Dr. Naoma Diener. But he usually sees the PA, [employer].                  He takes 10 - 20 mg hydrocodone per day, he takes 0 - 10 mg oxycodone per day 8. Spinal cord stimulator                 He has had a fusion of L1 - S1.                  His last back surgery was around 2006. Dr. Veronia Beets, in [facility] did the surgery. He has since left [place].                 He is not currently being followed by a surgeon for his back. He said that the pain specialist have followed this more now. 9. Left hip pain  He is getting a steroid injection - Dr. Maureen Ralphs - these have been held for at least one month  He had a right hip operation by dr. Maureen Ralphs in 2016. 10. Rheumatoid arthritis                  Sees Dr. Dierdre Forth                  On methotrexate - he is now getting a shot  q 1 week. This is stopped for now.  11.  DVT prophylaxis - SQ Heparin   Active Problems:   Morbid obesity (HCC)   Subjective:  Some epigastric "gas" pain.    Some "tightness" in upper chest - seems GI related.  He would like to go home.  Objective:   Vitals:   10/28/15 0500 10/28/15 0800  BP: 126/65   Pulse: (!) 54   Resp: 16   Temp:  98.1 F (36.7 C)     Intake/Output from previous day:  08/22 0701 - 08/23 0700 In: 3005 [P.O.:380; I.V.:2625] Out: 1150 [Urine:1150]  Intake/Output this shift:  No intake/output data recorded.   Physical Exam:   General: Obese WM who is alert and oriented.  Sitting in chair.   HEENT: Normal. Pupils equal. .   Lungs: Clear.  IS - 900 cc.  He is supine in bed today (compared to yesterday)   Heart:  RRR, pulse in 60's   Abdomen: Large.  Rare BS   Wound: Clean   Lab Results:     Recent Labs  10/27/15 0326 10/27/15 1609 10/28/15 0323  WBC 14.2*  --  18.2*  HGB 12.2* 12.3* 12.1*  HCT 33.3* 33.9* 33.5*  PLT 300  --  319    BMET    Recent Labs  10/26/15 0829  NA 134*  K 3.6  CL 96*  CO2 33*  GLUCOSE 114*  BUN 44*  CREATININE 0.88  CALCIUM 9.3    PT/INR  No results for input(s): LABPROT, INR in the last 72 hours.  ABG  No results for input(s): PHART, HCO3 in the last 72 hours.  Invalid input(s): PCO2, PO2   Studies/Results:  No results found.   Anti-infectives:   Anti-infectives    Start     Dose/Rate Route Frequency Ordered Stop   10/26/15 0811  cefoTEtan in Dextrose 5% (CEFOTAN) IVPB 2 g     2 g Intravenous On call to O.R. 10/26/15 0811 10/26/15 1011      Ovidio Kin, MD, FACS Pager: [phone] [facility]: [phone] 10/28/2015

## 2015-10-28 NOTE — Progress Notes (Signed)
  Echocardiogram 2D Echocardiogram has been performed.  Anthony Friedman 10/28/2015, 2:09 PM

## 2015-10-28 NOTE — Progress Notes (Signed)
DC instructions reviewed with the pt and his wife. All questions and concerns were addressed. Pt pain is controlled at this time, lap sites unremarkable, no apparent s/s of distress or discomfort. Follow up appts are in place and medication prescriptions are at his home. Will DC home with wife.

## 2015-10-29 ENCOUNTER — Telehealth: Payer: Self-pay | Admitting: Family

## 2015-10-29 NOTE — Telephone Encounter (Signed)
Called and spoke with pt and he stated that the dulera is very expensive and he cannot afford the medication.  He is post op from surgery on Monday.  Pt stated that the dulera is the only med that has worked for him.  He stated that he is willing to try advair if we are not able to give samples of the dulera.  BQ please advise and will have Marcelino Duster check the [facility] office to see if any samples of dulera is out there.  Thanks

## 2015-10-29 NOTE — Telephone Encounter (Signed)
No samples of Dulera at [facility] office.  Called and spoke with patient.  Advised him that I put in request for Tier exception to see if we can get it approved through his insurance since he is stable on this medication and he has already tried Symbicort, Spiriva and Advair.   Awaiting response from [facility]. Will hold in my box until response received.

## 2015-10-29 NOTE — Telephone Encounter (Signed)
Tier Exception requested via CMM:  Key: [id number]  The plan will fax you a determination, typically within 1 to 5 business days.  Will check samples in [facility] office and call patient.

## 2015-10-29 NOTE — Telephone Encounter (Signed)
Agree with this plan, let me know how I can help

## 2015-10-30 NOTE — Telephone Encounter (Signed)
Dulera PA was denied.  Please advise if you'd like to appeal this decision, or if you'd like to prescribe an alternative medication.  Thanks!

## 2015-11-03 ENCOUNTER — Ambulatory Visit: Payer: Medicare HMO

## 2015-11-04 NOTE — Telephone Encounter (Signed)
BQ please advise. thanks 

## 2015-11-04 NOTE — Telephone Encounter (Signed)
Needs alternative

## 2015-11-05 NOTE — Telephone Encounter (Signed)
Spoke with the pt  I advised he needs to provide [place] with his drug formulary so we can choose alternative  He verbalized understanding and nothing further needed Will call once this is obtained

## 2015-11-10 ENCOUNTER — Encounter: Payer: Medicare HMO | Attending: Surgery

## 2015-11-10 DIAGNOSIS — Z731 Type A behavior pattern: Secondary | ICD-10-CM | POA: Insufficient documentation

## 2015-11-10 DIAGNOSIS — I10 Essential (primary) hypertension: Secondary | ICD-10-CM | POA: Insufficient documentation

## 2015-11-10 DIAGNOSIS — E119 Type 2 diabetes mellitus without complications: Secondary | ICD-10-CM | POA: Insufficient documentation

## 2015-11-10 DIAGNOSIS — J449 Chronic obstructive pulmonary disease, unspecified: Secondary | ICD-10-CM | POA: Insufficient documentation

## 2015-11-10 NOTE — Progress Notes (Signed)
Bariatric Class:  Appt start time: 1530 end time:  1630.  2 Week Post-Operative Nutrition Class  Patient was seen on 11/10/2015 for Post-Operative Nutrition education [facility].   Surgery date: 10/26/2015 Surgery type: sleeve gastrectomy Start weight at [facility]: 336.5 lbs on 09/09/2015 Weight today: 312.9 lbs Weight change since 09/09/2015: 23.6 lbs  TANITA  BODY COMP RESULTS  10/05/15 11/10/15   BMI (kg/m^2) N/A N/A   Fat Mass (lbs)     Fat Free Mass (lbs)     Total Body Water (lbs)     The following the learning objectives were met by the patient during this course:  Identifies Phase 3A (Soft, High Proteins) Dietary Goals and will begin from 2 weeks post-operatively to 2 months post-operatively  Identifies appropriate sources of fluids and proteins   States protein recommendations and appropriate sources post-operatively  Identifies the need for appropriate texture modifications, mastication, and bite sizes when consuming solids  Identifies appropriate multivitamin and calcium sources post-operatively  Describes the need for physical activity post-operatively and will follow MD recommendations  States when to call healthcare provider regarding medication questions or post-operative complications  Handouts given during class include:  Phase 3A: Soft, High Protein Diet Handout  Follow-Up Plan: Patient will follow-up at [facility] in 6 weeks for 2 month post-op nutrition visit for diet advancement per MD.    

## 2015-11-16 ENCOUNTER — Encounter (INDEPENDENT_AMBULATORY_CARE_PROVIDER_SITE_OTHER): Payer: Self-pay

## 2015-11-16 ENCOUNTER — Ambulatory Visit (INDEPENDENT_AMBULATORY_CARE_PROVIDER_SITE_OTHER): Payer: Medicare HMO | Admitting: Physician Assistant

## 2015-11-16 ENCOUNTER — Encounter: Payer: Self-pay | Admitting: Physician Assistant

## 2015-11-16 ENCOUNTER — Ambulatory Visit: Payer: Medicare HMO | Admitting: Physician Assistant

## 2015-11-16 VITALS — BP 126/62 | HR 68 | Ht 68.0 in | Wt 310.4 lb

## 2015-11-16 DIAGNOSIS — I10 Essential (primary) hypertension: Secondary | ICD-10-CM | POA: Diagnosis not present

## 2015-11-16 DIAGNOSIS — R001 Bradycardia, unspecified: Secondary | ICD-10-CM

## 2015-11-16 DIAGNOSIS — I5032 Chronic diastolic (congestive) heart failure: Secondary | ICD-10-CM

## 2015-11-16 NOTE — Progress Notes (Signed)
Cardiology Office Note    Date:  11/16/2015   ID:  [name], DOB 1950/06/04, MRN [id number]  PCP:  Anthony Friedman, Anthony Corti, NP  Cardiologist:  Dr. Delton See  Chief Complaint: Hospital follow up for Bradycardia  History of Present Illness:   Anthony Friedman is a 65 y.o. male w/ PMH of morbid obesity, IDDM, HTN, OSA (on CPAP), and stage 3 CKD who underwent bariatric surgery with gastric sleeve placement 10/26/15.  Surgery and postop course had been uncomplicated, however cardiology consulted for post-op bradycardia with HR in the 30's. Completely asymptomatic, no evidence of AV block or sinus pauses. HR improved to 50-60s after holding atenolol. Advised to monitoring HR with increased activity level. She was on dual diuretics therapy (Atenolol-Chlorthalidone 100-25mg  daily, Losartan 50mg  daily, and Lasix 20mg   daily prior to admission for HTN). Held combo of ateno-chlor. Encouraged to keep a log of HR and BP readings. Echo showed LV EF of 60-65%, grade 2 DD.   Here for follow up. Seen by PCP 11/04/15 (note unavailable) and his losartan has been discontinued and lasix increased to 40mg  qd. BP in range of 130-140s/70-80s. Once went to 164/84. HR in rage of 60-80s. Denies dizziness, orthopnea, PND, syncope, LE edema. Weight trending down. BP of 126/62 with HR of 68 today.    Past Medical History:  Diagnosis Date  . Atelectasis   . 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 Chronic allergic rhinitis   . Chronic venous insufficiency    superficial  . COPD (chronic obstructive pulmonary disease) (HCC)   . Cough   . Diabetes mellitus without complication (HCC)   . Edema    lower extremities  . Elevated liver enzymes    d/t arava  . Family history of adverse reaction to anesthesia    pts mother had difficulty with anesthesia - pt not sure what difficulties were   . Hypertension   . Kidney stones    hx of   . Leg pain   . Liver enlargement    d/t arava  .  Obesity   . Pallor   . Pneumonia    hx of times 3; pt states is current with pneumonia vaccine  . RA (rheumatoid arthritis) (HCC)     rheumatoid arthritis   . Sleep apnea    cpap   . SOBOE (shortness of breath on exertion)   . Superficial thrombophlebitis    subacute-on anticoagulation  . Urinary frequency   . Urinary tract bacterial infections    hx of   . Venous stasis    changes    Past Surgical History:  Procedure Laterality Date  . HAND SURGERY  1965   right hand  . KNEE SURGERY     arthroscopic right and left knee  . LAMINECTOMY  1988   and fusion; L4, L5, S1  . LAMINECTOMY  2007   and fusion L1, L2, L3  . LAPAROSCOPIC GASTRIC SLEEVE RESECTION N/A 10/26/2015   Procedure: LAPAROSCOPIC GASTRIC SLEEVE RESECTION, UPPER ENDO;  Surgeon: , MD;  Location: [facility];  Service: General;  Laterality: N/A;  . OTHER SURGICAL HISTORY     polynidal cyst  . REPLACEMENT TOTAL KNEE  2009   left 2009; right 2014  . SPINAL CORD STIMULATOR IMPLANT     to control back pain   . TOTAL HIP ARTHROPLASTY Right 08/08/2014   Procedure: RIGHT TOTAL HIP ARTHROPLASTY ANTERIOR APPROACH;  Surgeon: 2010, MD;  Location: [facility];  Service: Orthopedics;  Laterality: Right;  . UMBILICAL HERNIA REPAIR      Current Medications: Prior to Admission medications   Medication Sig Start Date End Date Taking? Authorizing Provider  albuterol (PROAIR HFA) 108 (90 BASE) MCG/ACT inhaler Inhale 2 puffs into the lungs every 6 (six) hours as needed for wheezing.    Historical Provider, MD  aspirin 81 MG tablet Take 81 mg by mouth daily.    Historical Provider, MD  Calcium Carb-Cholecalciferol (CALCIUM 1000 + D) 1000-800 MG-UNIT TABS Take 1,800 mg by mouth every morning.     Historical Provider, MD  folic acid (FOLVITE) 1 MG tablet Take 1 tablet by mouth every morning.     Historical Provider, MD  furosemide (LASIX) 20 MG tablet Take 20 mg by mouth daily. 08/11/15   Historical Provider, MD    losartan (COZAAR) 50 MG tablet Take 1 tablet by mouth every morning.     Historical Provider, MD  metFORMIN (GLUCOPHAGE) 500 MG tablet Take 1 tablet by mouth 2 (two) times daily.    Historical Provider, MD  Methotrexate, PF, 20 MG/0.4ML SOAJ Inject into the skin once a week. Last injection on 10/09/2015    Historical Provider, MD  mometasone-formoterol (DULERA) 200-5 MCG/ACT AERO Inhale 2 puffs into the lungs 2 (two) times daily. 10/22/14   Storm Frisk, MD  Multiple Vitamin (MULTIVITAMIN) tablet Take 1 tablet by mouth every morning.     Historical Provider, MD  oxyCODONE-acetaminophen (PERCOCET/ROXICET) 5-325 MG per tablet Take 1-2 tablets by mouth at bedtime as needed for moderate pain.     Historical Provider, MD  rOPINIRole (REQUIP) 1 MG tablet Take 1 tablet by mouth daily as needed (restless leg.).     Historical Provider, MD  sodium chloride (OCEAN) 0.65 % SOLN nasal spray Place 1 spray into both nostrils as needed for congestion (For Rhinitis). Reported on 04/16/2015    Historical Provider, MD  Tamsulosin HCl (FLOMAX) 0.4 MG CAPS Take 0.4 mg by mouth every morning.     Historical Provider, MD    Allergies:   Atenolol and Sulfa antibiotics   Social History   Social History  . Marital status: Married    Spouse name: N/A  . Number of children: 4  . Years of education: N/A   Occupational History  . disabled      former Chief Financial Officer, office work    Social History Main Topics  . Smoking status: Former Smoker    Packs/day: 2.00    Years: 45.00    Types: Cigarettes    Quit date: 03/08/2007  . Smokeless tobacco: Never Used  . Alcohol use No     Comment: recovering alcoholic - none since 2012   . Drug use: No     Comment: hx of marijuana in teenage years  . Sexual activity: Not Asked   Other Topics Concern  . None   Social History Narrative  . None     Family History:  The patient's family history includes Diabetes in his brother; Esophageal cancer in his mother; Heart  attack in his father; Other in his mother.   ROS:   Please see the history of present illness.    ROS All other systems reviewed and are negative.   PHYSICAL EXAM:   VS:  BP 126/62   Pulse 68   Ht 5\' 8"  (1.727 m)   Wt (!) 310 lb 6.4 oz (140.8 kg)   SpO2 94%   BMI 47.20 kg/m  GEN: obese male in no acute distress  HEENT: normal  Neck: no JVD, carotid bruits, or masses Cardiac: RRR; no murmurs, rubs, or gallops,no edema  Respiratory:  clear to auscultation bilaterally, normal work of breathing GI: soft, + BS MS: no deformity or atrophy  Skin: warm and dry, no rash Neuro:  Alert and Oriented x 3, Strength and sensation are intact Psych: euthymic mood, full affect  Wt Readings from Last 3 Encounters:  11/16/15 (!) 310 lb 6.4 oz (140.8 kg)  11/10/15 (!) 312 lb 14.4 oz (141.9 kg)  10/26/15 (!) 332 lb 2 oz (150.7 kg)      Studies/Labs Reviewed:   EKG:  EKG is not ordered today.    Recent Labs: 10/26/2015: ALT 19; BUN 44; Creatinine, Ser 0.88; Potassium 3.6; Sodium 134 10/28/2015: Hemoglobin 12.1; Magnesium 1.8; Platelets 319   Lipid Panel No results found for: CHOL, TRIG, HDL, CHOLHDL, VLDL, LDLCALC, LDLDIRECT  Additional studies/ records that were reviewed today include:   Echocardiogram: 10/28/15  LV EF: 60% -   65%  ------------------------------------------------------------------- Indications:      Atrial fibrillation - 427.31.  ------------------------------------------------------------------- History:   PMH:   Chronic obstructive pulmonary disease.  Risk factors:  Former tobacco use. Hypertension. Diabetes mellitus. Morbidly obese.  ------------------------------------------------------------------- Study Conclusions  - Left ventricle: The cavity size was normal. Wall thickness was   increased in a pattern of moderate LVH. Systolic function was   normal. The estimated ejection fraction was in the range of 60%   to 65%. Wall motion was normal; there  were no regional wall   motion abnormalities. Features are consistent with a pseudonormal   left ventricular filling pattern, with concomitant abnormal   relaxation and increased filling pressure (grade 2 diastolic   dysfunction).    ASSESSMENT & PLAN:    1. Bradycardia - Asymptomatic. HR stable to 60-80s since discontinuation of atenolol. Avoid AV blocking agent.   2. HTN - Stable. Managed by PCP. Continue lasix.   3. Chronic diastolic dysfunction - Euvolemic today. Discussed HF at length. Avoid salt. Elevate legs. Try stocking. Has has significant varicose veins. Goal to control BP-->he is keeping log of BP and plan to follow up with PCP next week.   F/u with Dr. Delton SeeNelson PRN.   Medication Adjustments/Labs and Tests Ordered: Current medicines are reviewed at length with the patient today.  Concerns regarding medicines are outlined above.  Medication changes, Labs and Tests ordered today are listed in the Patient Instructions below. Patient Instructions  Medication Instructions:   Your physician recommends that you continue on your current medications as directed. Please refer to the Current Medication list given to you today.  If you need a refill on your cardiac medications before your next appointment, please call your pharmacy.  Labwork: NONE ORDER TODAY    Testing/Procedures: NONE ORDER TODAY    Follow-Up: CONTACT [facility]  TO SEE DR NELSON  [phone] AS NEEDED FOR  ANY CARDIAC RELATED SYMPTOMS    Any Other Special Instructions Will Be Listed Below (If Applicable).  Lorelei Pont, [place]  11/16/2015 10:46 AM    [facility] [address] [place], [place], [place]  [address] Phone: [phone]; Fax: [phone]

## 2015-11-16 NOTE — Patient Instructions (Signed)
Medication Instructions:   Your physician recommends that you continue on your current medications as directed. Please refer to the Current Medication list given to you today.  If you need a refill on your cardiac medications before your next appointment, please call your pharmacy.  Labwork: NONE ORDER TODAY    Testing/Procedures: NONE ORDER TODAY    Follow-Up: CONTACT [facility]  TO SEE DR NELSON  [phone] AS NEEDED FOR  ANY CARDIAC RELATED SYMPTOMS    Any Other Special Instructions Will Be Listed Below (If Applicable).

## 2015-11-17 ENCOUNTER — Telehealth: Payer: Self-pay | Admitting: Pulmonary Disease

## 2015-11-17 NOTE — Telephone Encounter (Signed)
Spoke with pt. States that he got a copy of his drug formulary. He is going to drop this so that BQ review this. He will need an alternative for [facility]. Will await the pt's drug formulary.

## 2015-11-18 MED ORDER — FLUTICASONE FUROATE-VILANTEROL 100-25 MCG/INH IN AEPB
1.0000 | INHALATION_SPRAY | Freq: Every day | RESPIRATORY_TRACT | 5 refills | Status: DC
Start: 2015-11-18 — End: 2016-01-05

## 2015-11-18 NOTE — Telephone Encounter (Signed)
Breo 100 

## 2015-11-18 NOTE — Telephone Encounter (Signed)
Spoke with pt. He is aware of the medication change. Rx has been sent in. His formulary book as been placed in the outgoing mail back to him. Nothing further was needed.

## 2015-11-18 NOTE — Telephone Encounter (Signed)
Review pt's formulary. The only inhalers listed at Advair Diskus, Advair HFA, Arnuity, Breo, Flovent Diskus, Flovent HFA and Symbicort.  BQ - please advise. Thanks.  *I have placed pt's formulary book in the brown accordion file up front.

## 2015-11-18 NOTE — Telephone Encounter (Signed)
Friend of patient dropped of formulary booklet. Placed in BQ's folder -pr

## 2015-11-18 NOTE — Telephone Encounter (Signed)
Pt returning call.Anthony Friedman ° °

## 2015-11-24 ENCOUNTER — Telehealth: Payer: Self-pay | Admitting: Pulmonary Disease

## 2015-11-24 NOTE — Telephone Encounter (Signed)
Pt states that he is having hip replacement surgery 12/11/15 and is needing to cancel upcoming appt with BQ 12/16/15. Pt states that he will call and reschedule once he is able to tell how long his recovery time is going to be. Pt aware that I will send this to Dr Kendrick Fries to make him aware of his upcoming surgery. Nothing further needed.

## 2015-11-24 NOTE — Telephone Encounter (Signed)
Ok

## 2015-11-30 ENCOUNTER — Other Ambulatory Visit: Payer: Self-pay | Admitting: Orthopaedic Surgery

## 2015-11-30 ENCOUNTER — Other Ambulatory Visit: Payer: Self-pay | Admitting: Physician Assistant

## 2015-11-30 NOTE — Progress Notes (Signed)
Scheduling for per op- please place SURGICAL ORDERS IN EPIC  thanks

## 2015-12-01 NOTE — Patient Instructions (Signed)
Anthony Friedman  12/01/2015   Your procedure is scheduled on: 12/11/2015    Report to [facility] Main  Entrance take [facility]  elevators to 3rd floor to  Short Stay Center at     1125 AM.  Call this number if you have problems the morning of surgery [phone]   Remember: ONLY 1 PERSON MAY GO WITH YOU TO SHORT STAY TO GET  READY MORNING OF YOUR SURGERY.  Do not eat food after midnite.  May have clear liquids from 12 midnite until 0630am then nothing by mouth.               Eat a good healthy snack prior to bedtime.      Take these medicines the morning of surgery with A SIP OF WATER: Proair Inhaler if needed and bring, Breo Ellipta, Oxycodone if needed, Flomax DO NOT TAKE ANY DIABETIC MEDICATIONS DAY OF YOUR SURGERY                               You may not have any metal on your body including hair pins and              piercings  Do not wear jewelry,  lotions, powders or perfumes, deodorant              Men may shave face and neck.   Do not bring valuables to the hospital. [name] IS NOT             RESPONSIBLE   FOR VALUABLES.  Contacts, dentures or bridgework may not be worn into surgery.  Leave suitcase in the car. After surgery it may be brought to your room.        Special Instructions: N/A              Please read over the following fact sheets you were given: _____________________________________________________________________                CLEAR LIQUID DIET   Foods Allowed                                                                     Foods Excluded  Coffee and tea, regular and decaf                             liquids that you cannot  Plain Jell-O in any flavor                                             see through such as: Fruit ices (not with fruit pulp)                                     milk, soups, orange juice  Iced Popsicles  All solid food Carbonated beverages, regular and diet                                     Cranberry, grape and apple juices Sports drinks like Gatorade Lightly seasoned clear broth or consume(fat free) Sugar, honey syrup  Sample Menu Breakfast                                Lunch                                     Supper Cranberry juice                    Beef broth                            Chicken broth Jell-O                                     Grape juice                           Apple juice Coffee or tea                        Jell-O                                      Popsicle                                                Coffee or tea                        Coffee or tea  _____________________________________________________________________  [facility] - Preparing for Surgery Before surgery, you can play an important role.  Because skin is not sterile, your skin needs to be as free of germs as possible.  You can reduce the number of germs on your skin by washing with CHG (chlorahexidine gluconate) soap before surgery.  CHG is an antiseptic cleaner which kills germs and bonds with the skin to continue killing germs even after washing. Please DO NOT use if you have an allergy to CHG or antibacterial soaps.  If your skin becomes reddened/irritated stop using the CHG and inform your nurse when you arrive at Short Stay. Do not shave (including legs and underarms) for at least 48 hours prior to the first CHG shower.  You may shave your face/neck. Please follow these instructions carefully:  1.  Shower with CHG Soap the night before surgery and the  morning of Surgery.  2.  If you choose to wash your hair, wash your hair first as usual with your  normal  shampoo.  3.  After you shampoo, rinse your hair and body thoroughly to remove the  shampoo.  4.  Use CHG as you would any other liquid soap.  You can apply chg directly  to the skin and wash                       Gently with a scrungie or clean washcloth.  5.  Apply the CHG  Soap to your body ONLY FROM THE NECK DOWN.   Do not use on face/ open                           Wound or open sores. Avoid contact with eyes, ears mouth and genitals (private parts).                       Wash face,  Genitals (private parts) with your normal soap.             6.  Wash thoroughly, paying special attention to the area where your surgery  will be performed.  7.  Thoroughly rinse your body with warm water from the neck down.  8.  DO NOT shower/wash with your normal soap after using and rinsing off  the CHG Soap.                9.  Pat yourself dry with a clean towel.            10.  Wear clean pajamas.            11.  Place clean sheets on your bed the night of your first shower and do not  sleep with pets. Day of Surgery : Do not apply any lotions/deodorants the morning of surgery.  Please wear clean clothes to the hospital/surgery center.  FAILURE TO FOLLOW THESE INSTRUCTIONS MAY RESULT IN THE CANCELLATION OF YOUR SURGERY PATIENT SIGNATURE_________________________________  NURSE SIGNATURE__________________________________  ________________________________________________________________________  WHAT IS A BLOOD TRANSFUSION? Blood Transfusion Information  A transfusion is the replacement of blood or some of its parts. Blood is made up of multiple cells which provide different functions.  Red blood cells carry oxygen and are used for blood loss replacement.  White blood cells fight against infection.  Platelets control bleeding.  Plasma helps clot blood.  Other blood products are available for specialized needs, such as hemophilia or other clotting disorders. BEFORE THE TRANSFUSION  Who gives blood for transfusions?   Healthy volunteers who are fully evaluated to make sure their blood is safe. This is blood bank blood. Transfusion therapy is the safest it has ever been in the practice of medicine. Before blood is taken from a donor, a complete history is taken to make  sure that person has no history of diseases nor engages in risky social behavior (examples are intravenous drug use or sexual activity with multiple partners). The donor's travel history is screened to minimize risk of transmitting infections, such as malaria. The donated blood is tested for signs of infectious diseases, such as HIV and hepatitis. The blood is then tested to be sure it is compatible with you in order to minimize the chance of a transfusion reaction. If you or a relative donates blood, this is often done in anticipation of surgery and is not appropriate for emergency situations. It takes many days to process the donated blood. RISKS AND COMPLICATIONS Although transfusion therapy is very safe and saves many lives, the main dangers of transfusion include:   Getting an infectious disease.  Developing a transfusion reaction.  This is an allergic reaction to something in the blood you were given. Every precaution is taken to prevent this. The decision to have a blood transfusion has been considered carefully by your caregiver before blood is given. Blood is not given unless the benefits outweigh the risks. AFTER THE TRANSFUSION  Right after receiving a blood transfusion, you will usually feel much better and more energetic. This is especially true if your red blood cells have gotten low (anemic). The transfusion raises the level of the red blood cells which carry oxygen, and this usually causes an energy increase.  The nurse administering the transfusion will monitor you carefully for complications. HOME CARE INSTRUCTIONS  No special instructions are needed after a transfusion. You may find your energy is better. Speak with your caregiver about any limitations on activity for underlying diseases you may have. SEEK MEDICAL CARE IF:   Your condition is not improving after your transfusion.  You develop redness or irritation at the intravenous (IV) site. SEEK IMMEDIATE MEDICAL CARE IF:   Any of the following symptoms occur over the next 12 hours:  Shaking chills.  You have a temperature by mouth above 102 F (38.9 C), not controlled by medicine.  Chest, back, or muscle pain.  People around you feel you are not acting correctly or are confused.  Shortness of breath or difficulty breathing.  Dizziness and fainting.  You get a rash or develop hives.  You have a decrease in urine output.  Your urine turns a dark color or changes to pink, red, or brown. Any of the following symptoms occur over the next 10 days:  You have a temperature by mouth above 102 F (38.9 C), not controlled by medicine.  Shortness of breath.  Weakness after normal activity.  The white part of the eye turns yellow (jaundice).  You have a decrease in the amount of urine or are urinating less often.  Your urine turns a dark color or changes to pink, red, or brown. Document Released: 02/19/2000 Document Revised: 05/16/2011 Document Reviewed: 10/08/2007 Exit[employer] Patient Information 2014 [place], [place].  _______________________________________________________________________  Incentive Spirometer  An incentive spirometer is a tool that can help keep your lungs clear and active. This tool measures how well you are filling your lungs with each breath. Taking long deep breaths may help reverse or decrease the chance of developing breathing (pulmonary) problems (especially infection) following:  A long period of time when you are unable to move or be active. BEFORE THE PROCEDURE   If the spirometer includes an indicator to show your best effort, your nurse or respiratory therapist will set it to a desired goal.  If possible, sit up straight or lean slightly forward. Try not to slouch.  Hold the incentive spirometer in an upright position. INSTRUCTIONS FOR USE  1. Sit on the edge of your bed if possible, or sit up as far as you can in bed or on a chair. 2. Hold the incentive spirometer  in an upright position. 3. Breathe out normally. 4. Place the mouthpiece in your mouth and seal your lips tightly around it. 5. Breathe in slowly and as deeply as possible, raising the piston or the ball toward the top of the column. 6. Hold your breath for 3-5 seconds or for as long as possible. Allow the piston or ball to fall to the bottom of the column. 7. Remove the mouthpiece from your mouth and breathe out normally. 8. Rest for a few seconds and repeat Steps  1 through 7 at least 10 times every 1-2 hours when you are awake. Take your time and take a few normal breaths between deep breaths. 9. The spirometer may include an indicator to show your best effort. Use the indicator as a goal to work toward during each repetition. 10. After each set of 10 deep breaths, practice coughing to be sure your lungs are clear. If you have an incision (the cut made at the time of surgery), support your incision when coughing by placing a pillow or rolled up towels firmly against it. Once you are able to get out of bed, walk around indoors and cough well. You may stop using the incentive spirometer when instructed by your caregiver.  RISKS AND COMPLICATIONS  Take your time so you do not get dizzy or light-headed.  If you are in pain, you may need to take or ask for pain medication before doing incentive spirometry. It is harder to take a deep breath if you are having pain. AFTER USE  Rest and breathe slowly and easily.  It can be helpful to keep track of a log of your progress. Your caregiver can provide you with a simple table to help with this. If you are using the spirometer at home, follow these instructions: SEEK MEDICAL CARE IF:   You are having difficultly using the spirometer.  You have trouble using the spirometer as often as instructed.  Your pain medication is not giving enough relief while using the spirometer.  You develop fever of 100.5 F (38.1 C) or higher. SEEK IMMEDIATE MEDICAL  CARE IF:   You cough up bloody sputum that had not been present before.  You develop fever of 102 F (38.9 C) or greater.  You develop worsening pain at or near the incision site. MAKE SURE YOU:   Understand these instructions.  Will watch your condition.  Will get help right away if you are not doing well or get worse. Document Released: 07/04/2006 Document Revised: 05/16/2011 Document Reviewed: 09/04/2006 [facility] Patient Information 2014 [place], [place].   ________________________________________________________________________

## 2015-12-02 ENCOUNTER — Encounter (HOSPITAL_COMMUNITY)
Admission: RE | Admit: 2015-12-02 | Discharge: 2015-12-02 | Disposition: A | Discharge status: Home / Self Care | Payer: Medicare HMO | Source: Ambulatory Visit | Attending: Orthopaedic Surgery | Admitting: Orthopaedic Surgery

## 2015-12-02 ENCOUNTER — Encounter (HOSPITAL_COMMUNITY): Payer: Self-pay

## 2015-12-02 DIAGNOSIS — Z01812 Encounter for preprocedural laboratory examination: Secondary | ICD-10-CM | POA: Insufficient documentation

## 2015-12-02 LAB — SURGICAL PCR SCREEN
MRSA, PCR: NEGATIVE
STAPHYLOCOCCUS AUREUS: NEGATIVE

## 2015-12-02 LAB — BASIC METABOLIC PANEL
Anion gap: 8 (ref 5–15)
BUN: 30 mg/dL — AB (ref 6–20)
CALCIUM: 9.2 mg/dL (ref 8.9–10.3)
CHLORIDE: 99 mmol/L — AB (ref 101–111)
CO2: 29 mmol/L (ref 22–32)
CREATININE: 0.85 mg/dL (ref 0.61–1.24)
GFR calc non Af Amer: >60 mL/min (ref >60)
Glucose, Bld: 94 mg/dL (ref 65–99)
Potassium: 4.6 mmol/L (ref 3.5–5.1)
SODIUM: 136 mmol/L (ref 135–145)

## 2015-12-02 LAB — CBC
HCT: 36.2 % — ABNORMAL LOW (ref 39.0–52.0)
Hemoglobin: 12.8 g/dL — ABNORMAL LOW (ref 13.0–17.0)
MCH: 32 pg (ref 26.0–34.0)
MCHC: 35.4 g/dL (ref 30.0–36.0)
MCV: 90.5 fL (ref 78.0–100.0)
PLATELETS: 310 10*3/uL (ref 150–400)
RBC: 4 MIL/uL — AB (ref 4.22–5.81)
WBC: 11.3 10*3/uL — ABNORMAL HIGH (ref 4.0–10.5)

## 2015-12-02 NOTE — Progress Notes (Signed)
CBC and BMP done 12/02/15 faxed via EPIC to Dr Allie Bossier.

## 2015-12-02 NOTE — Progress Notes (Signed)
EKG-10/27/15- EPIC  ECHO-10/28/15- EPIC  2V CXR- 04/16/15- EPIC  11/16/15- LOV- Cardiology - EPIC  ECHO- 12/07/2014- EPIC

## 2015-12-03 LAB — HEMOGLOBIN A1C
Hgb A1c MFr Bld: 5.1 % (ref 4.8–5.6)
Mean Plasma Glucose: 100 mg/dL

## 2015-12-03 LAB — TYPE AND SCREEN
ABO/RH(D): AB POS
Antibody Screen: NEGATIVE
DAT, IgG: NEGATIVE

## 2015-12-08 NOTE — Progress Notes (Signed)
Spoke with pts wife in regards to surgical time change. Pt and his wife aware to arrive at [facility] short stay on 12/11/2015 at 12:30 pm. Pt and his wife verbalized understanding of no food after midnight but can have clear liquid diet as instructed from midnight till 8:30 am day of surgery then nothing by mouth.

## 2015-12-10 MED ORDER — DEXTROSE 5 % IV SOLN
3.0000 g | INTRAVENOUS | Status: AC
  Administered 2015-12-11: 3 g via INTRAVENOUS
  Filled 2015-12-10: qty 3

## 2015-12-11 ENCOUNTER — Encounter (HOSPITAL_COMMUNITY)
Admission: RE | Disposition: A | Discharge status: Home Health | Payer: Self-pay | Source: Ambulatory Visit | Attending: Orthopaedic Surgery

## 2015-12-11 ENCOUNTER — Inpatient Hospital Stay (HOSPITAL_COMMUNITY): Payer: Medicare HMO

## 2015-12-11 ENCOUNTER — Inpatient Hospital Stay (HOSPITAL_COMMUNITY)
Admission: RE | Admit: 2015-12-11 | Discharge: 2015-12-14 | DRG: 470 | Disposition: A | Discharge status: Home Health | Payer: Medicare HMO | Source: Ambulatory Visit | Attending: Orthopaedic Surgery | Admitting: Orthopaedic Surgery

## 2015-12-11 ENCOUNTER — Encounter (HOSPITAL_COMMUNITY): Payer: Self-pay | Admitting: *Deleted

## 2015-12-11 ENCOUNTER — Inpatient Hospital Stay (HOSPITAL_COMMUNITY): Payer: Medicare HMO | Admitting: Anesthesiology

## 2015-12-11 DIAGNOSIS — Z6841 Body Mass Index (BMI) 40.0 and over, adult: Secondary | ICD-10-CM

## 2015-12-11 DIAGNOSIS — I872 Venous insufficiency (chronic) (peripheral): Secondary | ICD-10-CM | POA: Diagnosis present

## 2015-12-11 DIAGNOSIS — Z419 Encounter for procedure for purposes other than remedying health state, unspecified: Secondary | ICD-10-CM

## 2015-12-11 DIAGNOSIS — Z87891 Personal history of nicotine dependence: Secondary | ICD-10-CM | POA: Diagnosis not present

## 2015-12-11 DIAGNOSIS — G4730 Sleep apnea, unspecified: Secondary | ICD-10-CM | POA: Diagnosis present

## 2015-12-11 DIAGNOSIS — M069 Rheumatoid arthritis, unspecified: Secondary | ICD-10-CM | POA: Diagnosis present

## 2015-12-11 DIAGNOSIS — E119 Type 2 diabetes mellitus without complications: Secondary | ICD-10-CM | POA: Diagnosis present

## 2015-12-11 DIAGNOSIS — G2581 Restless legs syndrome: Secondary | ICD-10-CM | POA: Diagnosis present

## 2015-12-11 DIAGNOSIS — Z7984 Long term (current) use of oral hypoglycemic drugs: Secondary | ICD-10-CM | POA: Diagnosis not present

## 2015-12-11 DIAGNOSIS — Z96642 Presence of left artificial hip joint: Secondary | ICD-10-CM

## 2015-12-11 DIAGNOSIS — I10 Essential (primary) hypertension: Secondary | ICD-10-CM | POA: Diagnosis present

## 2015-12-11 DIAGNOSIS — J449 Chronic obstructive pulmonary disease, unspecified: Secondary | ICD-10-CM | POA: Diagnosis present

## 2015-12-11 DIAGNOSIS — Z96641 Presence of right artificial hip joint: Secondary | ICD-10-CM | POA: Diagnosis present

## 2015-12-11 DIAGNOSIS — M1612 Unilateral primary osteoarthritis, left hip: Principal | ICD-10-CM | POA: Diagnosis present

## 2015-12-11 DIAGNOSIS — M25552 Pain in left hip: Secondary | ICD-10-CM | POA: Diagnosis present

## 2015-12-11 DIAGNOSIS — Z96653 Presence of artificial knee joint, bilateral: Secondary | ICD-10-CM | POA: Diagnosis present

## 2015-12-11 HISTORY — PX: TOTAL HIP ARTHROPLASTY: SHX124

## 2015-12-11 LAB — TYPE AND SCREEN
ABO/RH(D): AB POS
ANTIBODY SCREEN: NEGATIVE

## 2015-12-11 SURGERY — ARTHROPLASTY, HIP, TOTAL, ANTERIOR APPROACH
Anesthesia: General | Laterality: Left

## 2015-12-11 MED ORDER — TAMSULOSIN HCL 0.4 MG PO CAPS
0.4000 mg | ORAL_CAPSULE | Freq: Every morning | ORAL | Status: DC
  Administered 2015-12-12 – 2015-12-14 (×3): 0.4 mg via ORAL
  Filled 2015-12-11 (×3): qty 1

## 2015-12-11 MED ORDER — PROMETHAZINE HCL 25 MG/ML IJ SOLN: 6.2500 mg | INTRAMUSCULAR | Status: DC | PRN

## 2015-12-11 MED ORDER — DOCUSATE SODIUM 100 MG PO CAPS
100.0000 mg | ORAL_CAPSULE | Freq: Two times a day (BID) | ORAL | Status: DC
  Administered 2015-12-12 – 2015-12-14 (×5): 100 mg via ORAL
  Filled 2015-12-11 (×6): qty 1

## 2015-12-11 MED ORDER — FLUTICASONE FUROATE-VILANTEROL 100-25 MCG/INH IN AEPB
1.0000 | INHALATION_SPRAY | Freq: Every day | RESPIRATORY_TRACT | Status: DC
  Administered 2015-12-12: 1 via RESPIRATORY_TRACT
  Filled 2015-12-11: qty 28

## 2015-12-11 MED ORDER — ROPINIROLE HCL 1 MG PO TABS: 1.0000 mg | ORAL_TABLET | Freq: Every day | ORAL | Status: DC | PRN

## 2015-12-11 MED ORDER — ACETAMINOPHEN 650 MG RE SUPP: 650.0000 mg | Freq: Four times a day (QID) | RECTAL | Status: DC | PRN

## 2015-12-11 MED ORDER — ACETAMINOPHEN 10 MG/ML IV SOLN
1000.0000 mg | Freq: Once | INTRAVENOUS | Status: AC
  Administered 2015-12-11: 1000 mg via INTRAVENOUS

## 2015-12-11 MED ORDER — HYDROMORPHONE HCL 1 MG/ML IJ SOLN
0.2500 mg | INTRAMUSCULAR | Status: DC | PRN
  Administered 2015-12-11: 0.5 mg via INTRAVENOUS

## 2015-12-11 MED ORDER — PROPOFOL 10 MG/ML IV BOLUS
INTRAVENOUS | Status: AC
  Filled 2015-12-11: qty 40

## 2015-12-11 MED ORDER — ONDANSETRON HCL 4 MG/2ML IJ SOLN: 4.0000 mg | Freq: Four times a day (QID) | INTRAMUSCULAR | Status: DC | PRN

## 2015-12-11 MED ORDER — TRANEXAMIC ACID 1000 MG/10ML IV SOLN
1000.0000 mg | INTRAVENOUS | Status: AC
  Administered 2015-12-11: 1000 mg via INTRAVENOUS
  Filled 2015-12-11: qty 1100

## 2015-12-11 MED ORDER — CHLORHEXIDINE GLUCONATE 4 % EX LIQD: 60.0000 mL | Freq: Once | CUTANEOUS | Status: DC

## 2015-12-11 MED ORDER — ACETAMINOPHEN 10 MG/ML IV SOLN: 1000.0000 mg | Freq: Once | INTRAVENOUS | Status: DC

## 2015-12-11 MED ORDER — MIDAZOLAM HCL 2 MG/2ML IJ SOLN: 0.5000 mg | INTRAMUSCULAR | Status: DC | PRN

## 2015-12-11 MED ORDER — METOCLOPRAMIDE HCL 5 MG/ML IJ SOLN: 5.0000 mg | Freq: Three times a day (TID) | INTRAMUSCULAR | Status: DC | PRN

## 2015-12-11 MED ORDER — CEFAZOLIN SODIUM-DEXTROSE 2-4 GM/100ML-% IV SOLN
2.0000 g | Freq: Four times a day (QID) | INTRAVENOUS | Status: AC
  Administered 2015-12-11 – 2015-12-12 (×2): 2 g via INTRAVENOUS
  Filled 2015-12-11 (×2): qty 100

## 2015-12-11 MED ORDER — ALUM & MAG HYDROXIDE-SIMETH 200-200-20 MG/5ML PO SUSP: 30.0000 mL | ORAL | Status: DC | PRN

## 2015-12-11 MED ORDER — SODIUM CHLORIDE 0.9 % IR SOLN
Status: DC | PRN
  Administered 2015-12-11: 1000 mL

## 2015-12-11 MED ORDER — ACETAMINOPHEN 10 MG/ML IV SOLN
INTRAVENOUS | Status: AC
  Filled 2015-12-11: qty 100

## 2015-12-11 MED ORDER — HYDROMORPHONE HCL 1 MG/ML IJ SOLN
INTRAMUSCULAR | Status: AC
  Administered 2015-12-12: 1 mg via INTRAVENOUS
  Filled 2015-12-11: qty 1

## 2015-12-11 MED ORDER — METHOCARBAMOL 500 MG PO TABS
500.0000 mg | ORAL_TABLET | Freq: Four times a day (QID) | ORAL | Status: DC | PRN
  Administered 2015-12-12 – 2015-12-14 (×8): 500 mg via ORAL
  Filled 2015-12-11 (×8): qty 1

## 2015-12-11 MED ORDER — LIDOCAINE 2% (20 MG/ML) 5 ML SYRINGE
INTRAMUSCULAR | Status: DC | PRN
  Administered 2015-12-11: 100 mg via INTRAVENOUS

## 2015-12-11 MED ORDER — SODIUM CHLORIDE 0.9 % IJ SOLN
INTRAMUSCULAR | Status: DC | PRN
  Administered 2015-12-11: 20 mL

## 2015-12-11 MED ORDER — VITAMIN B-12 1000 MCG PO TABS
500.0000 ug | ORAL_TABLET | Freq: Every day | ORAL | Status: DC
  Administered 2015-12-12 – 2015-12-14 (×3): 500 ug via ORAL
  Filled 2015-12-11 (×3): qty 1

## 2015-12-11 MED ORDER — BUPIVACAINE HCL (PF) 0.25 % IJ SOLN
INTRAMUSCULAR | Status: DC | PRN
  Administered 2015-12-11: 20 mL

## 2015-12-11 MED ORDER — BUPIVACAINE HCL (PF) 0.25 % IJ SOLN
INTRAMUSCULAR | Status: AC
  Filled 2015-12-11: qty 30

## 2015-12-11 MED ORDER — ASPIRIN EC 325 MG PO TBEC
325.0000 mg | DELAYED_RELEASE_TABLET | Freq: Two times a day (BID) | ORAL | Status: DC
  Administered 2015-12-12 – 2015-12-14 (×5): 325 mg via ORAL
  Filled 2015-12-11 (×5): qty 1

## 2015-12-11 MED ORDER — CALCIUM CARB-CHOLECALCIFEROL 1000-800 MG-UNIT PO TABS: 1.0000 | ORAL_TABLET | Freq: Three times a day (TID) | ORAL | Status: DC

## 2015-12-11 MED ORDER — SODIUM CHLORIDE 0.9 % IJ SOLN
INTRAMUSCULAR | Status: AC
  Filled 2015-12-11: qty 50

## 2015-12-11 MED ORDER — ROCURONIUM BROMIDE 10 MG/ML (PF) SYRINGE
PREFILLED_SYRINGE | INTRAVENOUS | Status: DC | PRN
  Administered 2015-12-11: 50 mg via INTRAVENOUS

## 2015-12-11 MED ORDER — KETOROLAC TROMETHAMINE 15 MG/ML IJ SOLN
7.5000 mg | Freq: Four times a day (QID) | INTRAMUSCULAR | Status: AC
  Administered 2015-12-11 – 2015-12-12 (×4): 7.5 mg via INTRAVENOUS
  Filled 2015-12-11 (×4): qty 1

## 2015-12-11 MED ORDER — HYDROMORPHONE HCL 1 MG/ML IJ SOLN
INTRAMUSCULAR | Status: AC
  Administered 2015-12-12: 1 mg via INTRAVENOUS
  Filled 2015-12-11: qty 2

## 2015-12-11 MED ORDER — DIPHENHYDRAMINE HCL 12.5 MG/5ML PO ELIX: 12.5000 mg | ORAL_SOLUTION | ORAL | Status: DC | PRN

## 2015-12-11 MED ORDER — MIDAZOLAM HCL 2 MG/2ML IJ SOLN
INTRAMUSCULAR | Status: AC
  Administered 2015-12-11: 0.5 mg via INTRAVENOUS
  Filled 2015-12-11: qty 2

## 2015-12-11 MED ORDER — FENTANYL CITRATE (PF) 100 MCG/2ML IJ SOLN
INTRAMUSCULAR | Status: AC
  Filled 2015-12-11: qty 2

## 2015-12-11 MED ORDER — FENTANYL CITRATE (PF) 100 MCG/2ML IJ SOLN
INTRAMUSCULAR | Status: DC | PRN
  Administered 2015-12-11 (×2): 100 ug via INTRAVENOUS
  Administered 2015-12-11: 50 ug via INTRAVENOUS
  Administered 2015-12-11: 100 ug via INTRAVENOUS
  Administered 2015-12-11: 50 ug via INTRAVENOUS

## 2015-12-11 MED ORDER — BUPIVACAINE LIPOSOME 1.3 % IJ SUSP
20.0000 mL | Freq: Once | INTRAMUSCULAR | Status: AC
  Administered 2015-12-11: 20 mL
  Filled 2015-12-11: qty 20

## 2015-12-11 MED ORDER — MENTHOL 3 MG MT LOZG: 1.0000 | LOZENGE | OROMUCOSAL | Status: DC | PRN

## 2015-12-11 MED ORDER — ACETAMINOPHEN 325 MG PO TABS: 650.0000 mg | ORAL_TABLET | Freq: Four times a day (QID) | ORAL | Status: DC | PRN

## 2015-12-11 MED ORDER — FOLIC ACID 1 MG PO TABS
1.0000 mg | ORAL_TABLET | Freq: Every morning | ORAL | Status: DC
  Administered 2015-12-12 – 2015-12-14 (×3): 1 mg via ORAL
  Filled 2015-12-11 (×3): qty 1

## 2015-12-11 MED ORDER — SODIUM CHLORIDE 0.9 % IV SOLN
INTRAVENOUS | Status: DC
  Administered 2015-12-11: 75 mL/h via INTRAVENOUS

## 2015-12-11 MED ORDER — MIDAZOLAM HCL 2 MG/2ML IJ SOLN
INTRAMUSCULAR | Status: AC
  Filled 2015-12-11: qty 2

## 2015-12-11 MED ORDER — CALCIUM CARBONATE-VITAMIN D 500-200 MG-UNIT PO TABS
1.0000 | ORAL_TABLET | Freq: Three times a day (TID) | ORAL | Status: DC
  Administered 2015-12-11 – 2015-12-14 (×8): 1 via ORAL
  Filled 2015-12-11 (×8): qty 1

## 2015-12-11 MED ORDER — ALBUTEROL SULFATE (2.5 MG/3ML) 0.083% IN NEBU: 2.5000 mg | INHALATION_SOLUTION | Freq: Four times a day (QID) | RESPIRATORY_TRACT | Status: DC | PRN

## 2015-12-11 MED ORDER — FUROSEMIDE 20 MG PO TABS
20.0000 mg | ORAL_TABLET | Freq: Every day | ORAL | Status: DC
  Administered 2015-12-12 – 2015-12-14 (×3): 20 mg via ORAL
  Filled 2015-12-11 (×3): qty 1

## 2015-12-11 MED ORDER — MIDAZOLAM HCL 2 MG/2ML IJ SOLN
0.5000 mg | INTRAMUSCULAR | Status: DC
  Administered 2015-12-11 (×2): 0.5 mg via INTRAVENOUS

## 2015-12-11 MED ORDER — LACTATED RINGERS IV SOLN
INTRAVENOUS | Status: DC
  Administered 2015-12-11: 15:00:00 via INTRAVENOUS
  Administered 2015-12-11: 1000 mL via INTRAVENOUS
  Administered 2015-12-11: 16:00:00 via INTRAVENOUS

## 2015-12-11 MED ORDER — SUGAMMADEX SODIUM 200 MG/2ML IV SOLN
INTRAVENOUS | Status: AC
  Filled 2015-12-11: qty 2

## 2015-12-11 MED ORDER — METOCLOPRAMIDE HCL 5 MG PO TABS: 5.0000 mg | ORAL_TABLET | Freq: Three times a day (TID) | ORAL | Status: DC | PRN

## 2015-12-11 MED ORDER — ONDANSETRON HCL 4 MG PO TABS: 4.0000 mg | ORAL_TABLET | Freq: Four times a day (QID) | ORAL | Status: DC | PRN

## 2015-12-11 MED ORDER — ADULT MULTIVITAMIN W/MINERALS CH
1.0000 | ORAL_TABLET | Freq: Two times a day (BID) | ORAL | Status: DC
  Administered 2015-12-11 – 2015-12-14 (×6): 1 via ORAL
  Filled 2015-12-11 (×9): qty 1

## 2015-12-11 MED ORDER — PROPOFOL 10 MG/ML IV BOLUS
INTRAVENOUS | Status: DC | PRN
  Administered 2015-12-11: 200 mg via INTRAVENOUS

## 2015-12-11 MED ORDER — MIDAZOLAM HCL 5 MG/5ML IJ SOLN
INTRAMUSCULAR | Status: DC | PRN
  Administered 2015-12-11: 2 mg via INTRAVENOUS

## 2015-12-11 MED ORDER — HYDROMORPHONE HCL 1 MG/ML IJ SOLN
0.2500 mg | INTRAMUSCULAR | Status: DC | PRN
  Administered 2015-12-11 (×5): 0.5 mg via INTRAVENOUS

## 2015-12-11 MED ORDER — PHENOL 1.4 % MT LIQD: 1.0000 | OROMUCOSAL | Status: DC | PRN

## 2015-12-11 MED ORDER — HYDROMORPHONE HCL 1 MG/ML IJ SOLN
1.0000 mg | INTRAMUSCULAR | Status: DC | PRN
  Administered 2015-12-11 – 2015-12-12 (×4): 1 mg via INTRAVENOUS
  Administered 2015-12-13 (×3): 2 mg via INTRAVENOUS
  Filled 2015-12-11 (×2): qty 2
  Filled 2015-12-11 (×2): qty 1
  Filled 2015-12-11 (×2): qty 2
  Filled 2015-12-11: qty 1

## 2015-12-11 MED ORDER — ONDANSETRON HCL 4 MG/2ML IJ SOLN
INTRAMUSCULAR | Status: AC
  Filled 2015-12-11: qty 2

## 2015-12-11 MED ORDER — POLYETHYLENE GLYCOL 3350 17 G PO PACK: 17.0000 g | PACK | Freq: Two times a day (BID) | ORAL | Status: DC | PRN

## 2015-12-11 MED ORDER — OXYCODONE HCL 5 MG PO TABS
5.0000 mg | ORAL_TABLET | ORAL | Status: DC | PRN
  Administered 2015-12-11: 5 mg via ORAL
  Administered 2015-12-11: 15 mg via ORAL
  Administered 2015-12-11: 10 mg via ORAL
  Administered 2015-12-12 – 2015-12-14 (×15): 15 mg via ORAL
  Filled 2015-12-11 (×4): qty 3
  Filled 2015-12-11: qty 2
  Filled 2015-12-11 (×10): qty 3
  Filled 2015-12-11: qty 1
  Filled 2015-12-11 (×2): qty 3

## 2015-12-11 MED ORDER — METHOCARBAMOL 1000 MG/10ML IJ SOLN
500.0000 mg | Freq: Four times a day (QID) | INTRAVENOUS | Status: DC | PRN
  Administered 2015-12-11: 500 mg via INTRAVENOUS
  Filled 2015-12-11: qty 550
  Filled 2015-12-11 (×2): qty 5

## 2015-12-11 MED ORDER — B-12 500 MCG SL SUBL: 500.0000 ug | SUBLINGUAL_TABLET | Freq: Every day | SUBLINGUAL | Status: DC

## 2015-12-11 MED ORDER — SUCCINYLCHOLINE CHLORIDE 20 MG/ML IJ SOLN
INTRAMUSCULAR | Status: DC | PRN
  Administered 2015-12-11: 100 mg via INTRAVENOUS

## 2015-12-11 SURGICAL SUPPLY — 42 items
ACETAB CUP W GRIPTION 54MM (Plate) ×1 IMPLANT
ACETAB CUP W/GRIPTION 54 (Plate) ×2 IMPLANT
APL SKNCLS STERI-STRIP NONHPOA (GAUZE/BANDAGES/DRESSINGS)
BAG SPEC THK2 15X12 ZIP CLS (MISCELLANEOUS)
BAG ZIPLOCK 12X15 (MISCELLANEOUS) IMPLANT
BENZOIN TINCTURE PRP APPL 2/3 (GAUZE/BANDAGES/DRESSINGS) IMPLANT
BLADE SAW SGTL 18X1.27X75 (BLADE) ×2 IMPLANT
BLADE SAW SGTL 18X1.27X75MM (BLADE) ×1
CAPT HIP TOTAL 2 ×2 IMPLANT
CELLS DAT CNTRL 66122 CELL SVR (MISCELLANEOUS) ×1 IMPLANT
CLOSURE WOUND 1/2 X4 (GAUZE/BANDAGES/DRESSINGS)
CLOTH BEACON ORANGE TIMEOUT ST (SAFETY) ×3 IMPLANT
CUP ACETAB W/GRIPTION 54 (Plate) IMPLANT
DRAPE STERI IOBAN 125X83 (DRAPES) ×3 IMPLANT
DRAPE U-SHAPE 47X51 STRL (DRAPES) ×6 IMPLANT
DRSG AQUACEL AG ADV 3.5X10 (GAUZE/BANDAGES/DRESSINGS) ×3 IMPLANT
DURAPREP 26ML APPLICATOR (WOUND CARE) ×3 IMPLANT
ELECT REM PT RETURN 9FT ADLT (ELECTROSURGICAL) ×3
ELECTRODE REM PT RTRN 9FT ADLT (ELECTROSURGICAL) ×1 IMPLANT
GAUZE XEROFORM 1X8 LF (GAUZE/BANDAGES/DRESSINGS) ×2 IMPLANT
GLOVE BIO SURGEON STRL SZ7.5 (GLOVE) ×3 IMPLANT
GLOVE BIOGEL PI IND STRL 8 (GLOVE) ×2 IMPLANT
GLOVE BIOGEL PI INDICATOR 8 (GLOVE) ×4
GLOVE ECLIPSE 8.0 STRL XLNG CF (GLOVE) ×3 IMPLANT
GOWN STRL REUS W/TWL XL LVL3 (GOWN DISPOSABLE) ×6 IMPLANT
HANDPIECE INTERPULSE COAX TIP (DISPOSABLE) ×3
HOLDER FOLEY CATH W/STRAP (MISCELLANEOUS) ×1 IMPLANT
PACK ANTERIOR HIP CUSTOM (KITS) ×3 IMPLANT
RETRACTOR WND ALEXIS 18 MED (MISCELLANEOUS) ×1 IMPLANT
RTRCTR WOUND ALEXIS 18CM MED (MISCELLANEOUS) ×3
SET HNDPC FAN SPRY TIP SCT (DISPOSABLE) ×1 IMPLANT
SPONGE LAP 18X18 X RAY DECT (DISPOSABLE) ×2 IMPLANT
STAPLER VISISTAT 35W (STAPLE) IMPLANT
STRIP CLOSURE SKIN 1/2X4 (GAUZE/BANDAGES/DRESSINGS) IMPLANT
SUT ETHIBOND NAB CT1 #1 30IN (SUTURE) ×3 IMPLANT
SUT MNCRL AB 4-0 PS2 18 (SUTURE) ×2 IMPLANT
SUT VIC AB 0 CT1 36 (SUTURE) ×3 IMPLANT
SUT VIC AB 1 CT1 36 (SUTURE) ×3 IMPLANT
SUT VIC AB 2-0 CT1 27 (SUTURE) ×6
SUT VIC AB 2-0 CT1 TAPERPNT 27 (SUTURE) ×2 IMPLANT
TRAY FOLEY W/METER SILVER 16FR (SET/KITS/TRAYS/PACK) ×3 IMPLANT
YANKAUER SUCT BULB TIP NO VENT (SUCTIONS) ×3 IMPLANT

## 2015-12-11 NOTE — Anesthesia Preprocedure Evaluation (Signed)
Anesthesia Evaluation  Patient identified by MRN, date of birth, ID band Patient awake    Reviewed: Allergy & Precautions, NPO status , Patient's Chart, lab work & pertinent test results  Airway Mallampati: II  TM Distance: >3 FB Neck ROM: Full    Dental no notable dental hx.    Pulmonary sleep apnea and Continuous Positive Airway Pressure Ventilation , COPD, former smoker,    Pulmonary exam normal breath sounds clear to auscultation       Cardiovascular hypertension, Pt. on medications Normal cardiovascular exam Rhythm:Regular Rate:Normal     Neuro/Psych negative neurological ROS  negative psych ROS   GI/Hepatic negative GI ROS, Neg liver ROS,   Endo/Other  diabetes, Type 2, Oral Hypoglycemic AgentsMorbid obesity  Renal/GU negative Renal ROS  negative genitourinary   Musculoskeletal  (+) Arthritis , Rheumatoid disorders,    Abdominal   Peds negative pediatric ROS (+)  Hematology negative hematology ROS (+)   Anesthesia Other Findings   Reproductive/Obstetrics negative OB ROS                             Lab Results  Component Value Date   WBC 11.3 (H) 12/02/2015   HGB 12.8 (L) 12/02/2015   HCT 36.2 (L) 12/02/2015   MCV 90.5 12/02/2015   PLT 310 12/02/2015   Lab Results  Component Value Date   CREATININE 0.85 12/02/2015   BUN 30 (H) 12/02/2015   NA 136 12/02/2015   K 4.6 12/02/2015   CL 99 (L) 12/02/2015   CO2 29 12/02/2015    Anesthesia Physical  Anesthesia Plan  ASA: III  Anesthesia Plan: General   Post-op Pain Management:    Induction: Intravenous  Airway Management Planned: Oral ETT  Additional Equipment:   Intra-op Plan:   Post-operative Plan: Extubation in OR  Informed Consent: I have reviewed the patients History and Physical, chart, labs and discussed the procedure including the risks, benefits and alternatives for the proposed anesthesia with the  patient or authorized representative who has indicated his/her understanding and acceptance.   Dental advisory given  Plan Discussed with: CRNA  Anesthesia Plan Comments:         Anesthesia Quick Evaluation

## 2015-12-11 NOTE — Anesthesia Procedure Notes (Signed)
Procedure Name: Intubation Performed by: Saahas Hidrogo J Pre-anesthesia Checklist: Patient identified, Emergency Drugs available, Suction available, Patient being monitored and Timeout performed Patient Re-evaluated:Patient Re-evaluated prior to inductionOxygen Delivery Method: Circle system utilized Preoxygenation: Pre-oxygenation with 100% oxygen Intubation Type: IV induction Ventilation: Mask ventilation without difficulty Laryngoscope Size: Mac and 4 Grade View: Grade II Tube type: Oral Tube size: 7.5 mm Number of attempts: 1 Airway Equipment and Method: Stylet Placement Confirmation: ETT inserted through vocal cords under direct vision,  positive ETCO2,  CO2 detector and breath sounds checked- equal and bilateral Secured at: 23 cm Tube secured with: Tape Dental Injury: Teeth and Oropharynx as per pre-operative assessment        

## 2015-12-11 NOTE — Brief Op Note (Signed)
12/11/2015  3:47 PM  PATIENT:  Anthony Friedman  65 y.o. male  PRE-OPERATIVE DIAGNOSIS:  left hip osteoarthritis  POST-OPERATIVE DIAGNOSIS:  left hip osteoarthritis  PROCEDURE:  Procedure(s): LEFT TOTAL HIP ARTHROPLASTY ANTERIOR APPROACH (Left)  SURGEON:  Surgeon(s) and Role:    * Kathryne Hitch, MD - Primary  PHYSICIAN ASSISTANT: Rexene Edison, PA-C  ANESTHESIA:   general  EBL:  Total I/O In: 1000 [I.V.:1000] Out: 700 [Blood:700]  DICTATION: .Other Dictation: Dictation Number [id number]  PLAN OF CARE: Admit to inpatient   PATIENT DISPOSITION:  PACU - hemodynamically stable.   Delay start of Pharmacological VTE agent (>24hrs) due to surgical blood loss or risk of bleeding: no

## 2015-12-11 NOTE — H&P (Signed)
TOTAL HIP ADMISSION H&P  Patient is admitted for left total hip arthroplasty.  Subjective:  Chief Complaint: left hip pain  HPI: Anthony Friedman, 65 y.o. male, has a history of pain and functional disability in the left hip(s) due to arthritis and patient has failed non-surgical conservative treatments for greater than 12 weeks to include NSAID's and/or analgesics, corticosteriod injections, flexibility and strengthening excercises, supervised PT with diminished ADL's post treatment, use of assistive devices, weight reduction as appropriate and activity modification.  Onset of symptoms was gradual starting 2 years ago with rapidlly worsening course since that time.The patient noted no past surgery on the left hip(s).  Patient currently rates pain in the left hip at 10 out of 10 with activity. Patient has night pain, worsening of pain with activity and weight bearing, trendelenberg gait, pain that interfers with activities of daily living, pain with passive range of motion and crepitus. Patient has evidence of subchondral sclerosis, periarticular osteophytes and joint space narrowing by imaging studies. This condition presents safety issues increasing the risk of falls.  There is no current active infection.  Patient Active Problem List   Diagnosis Date Noted  . Osteoarthritis of left hip 12/11/2015  . Bradycardia 10/28/2015  . Morbid obesity (HCC) 10/26/2015  . Pre-op evaluation 04/16/2015  . Osteoarthritis of right hip 08/08/2014  . Status post total replacement of right hip 08/08/2014  . 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 Edema, peripheral 10/29/2013  . Insufficiency, vascular 10/29/2013  . Cervical osteoarthritis 09/13/2013  . Cervical pain 09/05/2013  . 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 Benign prostatic hypertrophy without urinary obstruction 04/04/2013  . Restless leg 04/04/2013  . Diabetes mellitus, type 2 (HCC) 04/04/2013  . 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 Anxiety 12/20/2012  . DDD (degenerative  disc disease), lumbosacral 12/20/2012  . 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 Varicose veins of both legs with edema 10/25/2012  . Acquired spondylolisthesis 08/23/2012  . Chronic pain 08/11/2012  . Excessive urination at night 08/11/2012  . RA (rheumatoid arthritis) (HCC)   . Chronic venous insufficiency   . COPD gold stage C.   . Obesity   . Hypertension   . Chronic allergic rhinitis    Past Medical History:  Diagnosis Date  . Atelectasis   . 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 Chronic allergic rhinitis   . Chronic venous insufficiency    superficial  . COPD (chronic obstructive pulmonary disease) (HCC)   . Cough   . Diabetes mellitus without complication (HCC)    not on medications since  surgery 10/2015   . Edema    lower extremities  . Elevated liver enzymes    d/t arava  . Family history of adverse reaction to anesthesia    pts mother had difficulty with anesthesia - pt not sure what difficulties were   . Hypertension    no longer on blood pressure medications since 10/25/2015 per patient   . Kidney stones    hx of   . Leg pain   . Liver enlargement    d/t arava  . Obesity   . Pallor   . Pneumonia    hx of times 3; pt states is current with pneumonia vaccine  . RA (rheumatoid arthritis) (HCC)     rheumatoid arthritis   . Sleep apnea    bipap  . SOBOE (shortness of breath on exertion)   . Superficial thrombophlebitis    subacute-on anticoagulation  .  Urinary frequency   . Urinary tract bacterial infections    hx of   . Venous stasis    changes    Past Surgical History:  Procedure Laterality Date  . HAND SURGERY  1965   right hand  . KNEE SURGERY     arthroscopic right and left knee  . LAMINECTOMY  1988   and fusion; L4, L5, S1  . LAMINECTOMY  2007   and fusion L1, L2, L3  . LAPAROSCOPIC GASTRIC SLEEVE RESECTION N/A 10/26/2015   Procedure: LAPAROSCOPIC GASTRIC SLEEVE RESECTION, UPPER ENDO;   Surgeon: Ovidio Kin, MD;  Location: [facility] ORS;  Service: General;  Laterality: N/A;  . OTHER SURGICAL HISTORY     polynidal cyst  . REPLACEMENT TOTAL KNEE  2009   left 2009; right 2014  . SPINAL CORD STIMULATOR IMPLANT     to control back pain   . TOTAL HIP ARTHROPLASTY Right 08/08/2014   Procedure: RIGHT TOTAL HIP ARTHROPLASTY ANTERIOR APPROACH;  Surgeon: Kathryne Hitch, MD;  Location: [facility] ORS;  Service: Orthopedics;  Laterality: Right;  . UMBILICAL HERNIA REPAIR      No prescriptions prior to admission.   Allergies  Allergen Reactions  . Atenolol     Lowers heart rate  . Sulfa Antibiotics     Blood in urine Blood in urine    Social History  Substance Use Topics  . Smoking status: Former Smoker    Packs/day: 2.00    Years: 45.00    Types: Cigarettes    Quit date: 03/08/2007  . Smokeless tobacco: Never Used  . Alcohol use No     Comment: recovering alcoholic - none since 2012     Family History  Problem Relation Age of Onset  . Heart attack Father   . Esophageal cancer Mother   . Other Mother     Tachycardia  . Diabetes Brother      Review of Systems  Musculoskeletal: Positive for back pain and joint pain.  All other systems reviewed and are negative.   Objective:  Physical Exam  Constitutional: He is oriented to person, place, and time. He appears well-developed and well-nourished.  HENT:  Head: Normocephalic and atraumatic.  Eyes: EOM are normal. Pupils are equal, round, and reactive to light.  Neck: Normal range of motion. Neck supple.  Cardiovascular: Normal rate and regular rhythm.   Respiratory: Effort normal and breath sounds normal.  GI: Soft. Bowel sounds are normal.  Musculoskeletal:       Left hip: He exhibits decreased range of motion, decreased strength, tenderness and bony tenderness.  Neurological: He is alert and oriented to person, place, and time.  Skin: Skin is warm and dry.  Psychiatric: He has a normal mood and affect.    Vital  signs in last 24 hours:    Labs:   Estimated body mass index is 46.38 kg/m as calculated from the following:   Height as of 12/02/15: 5\' 8"  (1.727 m).   Weight as of 12/02/15: 138.3 kg (305 lb).   Imaging Review Plain radiographs demonstrate severe degenerative joint disease of the left hip(s). The bone quality appears to be good for age and reported activity level.  Assessment/Plan:  End stage arthritis, left hip(s)  The patient history, physical examination, clinical judgement of the provider and imaging studies are consistent with end stage degenerative joint disease of the left hip(s) and total hip arthroplasty is deemed medically necessary. The treatment options including medical management, injection therapy, arthroscopy and arthroplasty  were discussed at length. The risks and benefits of total hip arthroplasty were presented and reviewed. The risks due to aseptic loosening, infection, stiffness, dislocation/subluxation,  thromboembolic complications and other imponderables were discussed.  The patient acknowledged the explanation, agreed to proceed with the plan and consent was signed. Patient is being admitted for inpatient treatment for surgery, pain control, PT, OT, prophylactic antibiotics, VTE prophylaxis, progressive ambulation and ADL's and discharge planning.The patient is planning to be discharged home with home health services vs skilled nursing.

## 2015-12-11 NOTE — Anesthesia Postprocedure Evaluation (Signed)
Anesthesia Post Note  Patient: Anthony Friedman  Procedure(s) Performed: Procedure(s) (LRB): LEFT TOTAL HIP ARTHROPLASTY ANTERIOR APPROACH (Left)  Patient location during evaluation: PACU Anesthesia Type: General Level of consciousness: awake and alert Pain management: pain level controlled Vital Signs Assessment: post-procedure vital signs reviewed and stable Respiratory status: spontaneous breathing, nonlabored ventilation, respiratory function stable and patient connected to nasal cannula oxygen Cardiovascular status: blood pressure returned to baseline and stable Postop Assessment: no signs of nausea or vomiting Anesthetic complications: no    Last Vitals:  Vitals:   12/11/15 1730 12/11/15 1744  BP: 118/72 (!) 141/82  Pulse: 97 93  Resp: 16 16  Temp: 36.5 C 36.6 C    Last Pain:  Vitals:   12/11/15 1809  TempSrc:   PainSc: 6                  Kennieth Rad

## 2015-12-11 NOTE — Transfer of Care (Signed)
Immediate Anesthesia Transfer of Care Note  Patient: Anthony Friedman  Procedure(s) Performed: Procedure(s): LEFT TOTAL HIP ARTHROPLASTY ANTERIOR APPROACH (Left)  Patient Location: PACU  Anesthesia Type:General  Level of Consciousness: awake, alert  and oriented  Airway & Oxygen Therapy: Patient Spontanous Breathing and Patient connected to face mask oxygen  Post-op Assessment: Report given to RN and Post -op Vital signs reviewed and stable  Post vital signs: Reviewed and stable  Last Vitals:  Vitals:   12/11/15 1214  BP: 119/81  Pulse: 69  Resp: 16  Temp: 36.7 C    Last Pain:  Vitals:   12/11/15 1214  TempSrc: Oral      Patients Stated Pain Goal: 4 (12/11/15 1357)  Complications: No apparent anesthesia complications

## 2015-12-12 LAB — BASIC METABOLIC PANEL
ANION GAP: 6 (ref 5–15)
BUN: 23 mg/dL — ABNORMAL HIGH (ref 6–20)
CALCIUM: 8.7 mg/dL — AB (ref 8.9–10.3)
CO2: 29 mmol/L (ref 22–32)
Chloride: 103 mmol/L (ref 101–111)
Creatinine, Ser: 0.96 mg/dL (ref 0.61–1.24)
GFR calc non Af Amer: >60 mL/min (ref >60)
Glucose, Bld: 100 mg/dL — ABNORMAL HIGH (ref 65–99)
Potassium: 4.2 mmol/L (ref 3.5–5.1)
Sodium: 138 mmol/L (ref 135–145)

## 2015-12-12 LAB — CBC
HEMATOCRIT: 30.1 % — AB (ref 39.0–52.0)
HEMOGLOBIN: 10.8 g/dL — AB (ref 13.0–17.0)
MCH: 35.2 pg — ABNORMAL HIGH (ref 26.0–34.0)
MCHC: 35.9 g/dL (ref 30.0–36.0)
MCV: 98 fL (ref 78.0–100.0)
Platelets: 260 10*3/uL (ref 150–400)
RBC: 3.07 MIL/uL — AB (ref 4.22–5.81)
RDW: 14.5 % (ref 11.5–15.5)
WBC: 10.8 10*3/uL — ABNORMAL HIGH (ref 4.0–10.5)

## 2015-12-12 NOTE — Evaluation (Signed)
Occupational Therapy Evaluation Patient Details Name: Anthony Friedman MRN: [id number] DOB: [birth year] Today's Date: 12/12/2015    History of Present Illness Pt is a 65 year old male s/p L direct anterior THA with hx of R THA, RA, COPD, obesity, back surgeries, bil TKA   Clinical Impression   Pt was admitted for the above.  He was moving well during OT but limited by pain. Will check on him prior to d/c for any further questions and to review shower transfer    Follow Up Recommendations  Supervision/Assistance - 24 hour    Equipment Recommendations  None recommended by OT    Recommendations for Other Services       Precautions / Restrictions Precautions Precautions: Fall Restrictions Weight Bearing Restrictions: No Other Position/Activity Restrictions: WBAT      Mobility Bed Mobility Overal bed mobility: Needs Assistance Bed Mobility: Supine to Sit     Supine to sit: Min assist;HOB elevated     General bed mobility comments: oob  Transfers Overall transfer level: Needs assistance Equipment used: Rolling walker (2 wheeled) Transfers: Sit to/from Stand Sit to Stand: Min guard;Min assist         General transfer comment: assist only to advance LLE when sitting down    Balance                                            ADL Overall ADL's : Needs assistance/impaired                         Toilet Transfer: Minimal assistance;Ambulation;BSC;RW   Toileting- Clothing Manipulation and Hygiene: Supervision/safety;Sit to/from stand         General ADL Comments: ambulated to bathroom, sat on commode and used urinal from standing.  Pt had increased pain when standing.  OT offered to return later, but pt wanted to work through it.  Assist given to extend LLE when sitting and to support it when pt getting out of recliner.  Did not review shower transfer. Pt is moving well, but limited by pain.  Will stop by tomorrow to verbally review shower  transfer and check on any further needs     Vision     Perception     Praxis      Pertinent Vitals/Pain Pain Assessment: 0-10 Pain Score: 10-Worst pain ever Pain Location: L hip with weight bearing Pain Descriptors / Indicators: Aching Pain Intervention(s): Limited activity within patient's tolerance;Monitored during session;Repositioned;Ice applied;Patient requesting pain meds-RN notified     Hand Dominance     Extremity/Trunk Assessment Upper Extremity Assessment Upper Extremity Assessment: Overall WFL for tasks assessed          Communication Communication Communication: No difficulties   Cognition Arousal/Alertness: Awake/alert Behavior During Therapy: WFL for tasks assessed/performed Overall Cognitive Status: Within Functional Limits for tasks assessed                     General Comments       Exercises       Shoulder Instructions      Home Living Family/patient expects to be discharged to:: Private residence Living Arrangements: Spouse/significant other Available Help at Discharge: Family Type of Home: House Home Access: Stairs to enter Secretary/administrator of Steps: 1 Entrance Stairs-Rails: None Home Layout: One level     Bathroom Shower/Tub: Psychologist, counselling  Bathroom Toilet: Standard     Home Equipment: Environmental consultant - 2 wheels          Prior Functioning/Environment Level of Independence: Independent with assistive device(s)        Comments: has AE        OT Problem List: Pain   OT Treatment/Interventions: Self-care/ADL training;DME and/or AE instruction;Patient/family education    OT Goals(Current goals can be found in the care plan section) Acute Rehab OT Goals Patient Stated Goal: decreased pain OT Goal Formulation: With patient Time For Goal Achievement: 12/19/15 Potential to Achieve Goals: Good ADL Goals Pt Will Perform Tub/Shower Transfer: Shower transfer;with min guard assist;ambulating;shower seat  OT Frequency:  Min 1X/week   Barriers to D/C:            Co-evaluation              End of Session    Activity Tolerance: Patient limited by pain Patient left: in chair;with call bell/phone within reach   Time: 3762-8315 OT Time Calculation (min): 24 min Charges:  OT General Charges $OT Visit: 1 Procedure OT Evaluation $OT Eval Low Complexity: 1 Procedure G-Codes:    Mliss Wedin 01-08-2016, 12:44 PM Marica Otter, OTR/L [phone] 01/08/16

## 2015-12-12 NOTE — Evaluation (Signed)
Physical Therapy Evaluation Patient Details Name: Anthony Friedman MRN: [id number] DOB: 11/04/1950 Today's Date: 12/12/2015   History of Present Illness  Pt is a 65 year old male s/p L direct anterior THA with hx of R THA, RA, COPD, obesity, back surgeries, bil TKA  Clinical Impression  Pt is s/p L THA resulting in the deficits listed below (see PT Problem List).  Pt will benefit from skilled PT to increase their independence and safety with mobility to allow discharge to the venue listed below.  Pt assisted with ambulating short distance and reports increased L hip pain (RN notified).  Pt plans to d/c home with spouse.     Follow Up Recommendations Home health PT    Equipment Recommendations  None recommended by PT    Recommendations for Other Services       Precautions / Restrictions Precautions Precautions: Fall Restrictions Other Position/Activity Restrictions: WBAT      Mobility  Bed Mobility Overal bed mobility: Needs Assistance Bed Mobility: Supine to Sit     Supine to sit: Min assist;HOB elevated     General bed mobility comments: pt utilized trapeze bar, assist for L LE  Transfers Overall transfer level: Needs assistance Equipment used: Rolling walker (2 wheeled) Transfers: Sit to/from Stand Sit to Stand: Min guard;From elevated surface         General transfer comment: verbal cues for UE and LE positioning  Ambulation/Gait Ambulation/Gait assistance: Min guard Ambulation Distance (Feet): 15 Feet (x2) Assistive device: Rolling walker (2 wheeled) Gait Pattern/deviations: Step-to pattern;Decreased stance time - left;Antalgic Gait velocity: decr   General Gait Details: verbal cues for sequence, step length, posture  Stairs            Wheelchair Mobility    Modified Rankin (Stroke Patients Only)       Balance                                             Pertinent Vitals/Pain Pain Assessment: 0-10 Pain Score: 7  Pain  Location: L hip Pain Descriptors / Indicators: Aching;Sore Pain Intervention(s): Patient requesting pain meds-RN notified;Monitored during session;Limited activity within patient's tolerance;Repositioned    Home Living Family/patient expects to be discharged to:: Private residence Living Arrangements: Spouse/significant other Available Help at Discharge: Family Type of Home: House Home Access: Stairs to enter Entrance Stairs-Rails: None Secretary/administrator of Steps: 1 Home Layout: One level Home Equipment: Environmental consultant - 2 wheels      Prior Function Level of Independence: Independent with assistive device(s)               Hand Dominance        Extremity/Trunk Assessment               Lower Extremity Assessment: LLE deficits/detail   LLE Deficits / Details: anticipated post op hip weakness, requiring assist for bed mobility     Communication   Communication: No difficulties  Cognition Arousal/Alertness: Awake/alert Behavior During Therapy: WFL for tasks assessed/performed Overall Cognitive Status: Within Functional Limits for tasks assessed                      General Comments      Exercises     Assessment/Plan    PT Assessment Patient needs continued PT services  PT Problem List Decreased strength;Decreased range of motion;Decreased activity tolerance;Decreased mobility;Pain  PT Treatment Interventions Functional mobility training;Stair training;Gait training;DME instruction;Therapeutic activities;Therapeutic exercise;Patient/family education    PT Goals (Current goals can be found in the Care Plan section)  Acute Rehab PT Goals PT Goal Formulation: With patient Time For Goal Achievement: 12/15/15 Potential to Achieve Goals: Good    Frequency 7X/week   Barriers to discharge        Co-evaluation               End of Session Equipment Utilized During Treatment: Gait belt Activity Tolerance: Patient limited by  pain Patient left: in chair;with call bell/phone within reach Nurse Communication: Patient requests pain meds         Time: 7048-8891 PT Time Calculation (min) (ACUTE ONLY): 17 min   Charges:   PT Evaluation $PT Eval Low Complexity: 1 Procedure     PT G Codes:        Anthony Friedman,Anthony Friedman 12/12/2015, 9:49 AM Anthony Friedman, PT, DPT 12/12/2015 Pager: [phone]

## 2015-12-12 NOTE — Op Note (Signed)
NAME:  Anthony Friedman, Anthony Friedman NO.:  [id number]  MEDICAL RECORD NO.:  [id number]  LOCATION:  1608                         FACILITY:  [facility]  PHYSICIAN:  Vanita Panda. Magnus Ivan, M.D.DATE OF BIRTH:  12-Mar-1950  DATE OF PROCEDURE:  12/11/2015 DATE OF DISCHARGE:                              OPERATIVE REPORT   PREOPERATIVE DIAGNOSES:  Severe end-stage arthritis, degenerative joint disease, left hip.  POSTOPERATIVE DIAGNOSES:  Severe end-stage arthritis, degenerative joint disease, left hip.  PROCEDURE:  Left total hip arthroplasty through direct anterior approach.  IMPLANTS:  DePuy Sector Gription acetabular component size 56, 2 screws, size 36 +4 polyethylene liner, size 12 Corail femoral component with varus offset (KLA), size 36 +1.5 ceramic hip ball.  SURGEON:  Vanita Panda. Magnus Ivan, M.D.  ASSISTANT:  Richardean Canal, PA-C.  ANESTHESIA:  General.  ANTIBIOTICS:  3 g of IV Ancef.  BLOOD LOSS:  800 mL.  COMPLICATIONS:  None.  INDICATIONS:  Anthony Friedman is a 65 year old gentleman with morbid obesity, who has had bilateral knee replacements and a right hip replacement.  He has had nail bariatric surgery and has now lost 60 pounds, but he still is significantly obese.  His left hip is incredibly painful to him as he has flattening of the femoral head and this has worsened over the last year when I replaced his right hip.  His knees were replaced elsewhere. Now his quality of life is so detrimentally affected that he is having trouble exercising, that he wished to proceed with left total hip arthroplasty.  Understanding that his risks are heightened of infection, DVT, dislocation, nerve and vessel injury, as well as fracture due to his weight.  He understands our goals are to decrease pain, improve mobility, and overall improve quality of life.  PROCEDURE DESCRIPTION:  After informed consent was obtained, appropriate left hip was marked.  He was brought to the  operating room, and general anesthesia was obtained while he was on the stretcher.  Traction boots were placed on both his feet.  Next, he was placed supine on the Hana fracture table with a perineal post in place and both legs in inline skeletal traction devices, but no traction applied.  His left operative hip was prepped and draped with DuraPrep and sterile drapes.  A time-out was called.  He was identified as correct patient, correct left hip.  We then made an incision just inferior and posterior to the anterior- superior iliac spine and carried this obliquely down the leg.  We dissected down the tensor fascia lata muscle.  The tensor fascia was then divided longitudinally to proceed with a direct anterior approach to the hip.  We identified and cauterized the circumflex vessels and then identified the hip capsule.  I opened up the hip capsule in an L- type format, finding a significant arthritis in his left hip and significant periarticular osteophytes as well as joint effusion.  We then made our femoral neck cut with an oscillating saw proximal to the lesser trochanter and completed this on osteotome.  We placed a corkscrew guide in the femoral head and removed the femoral head in entirety and found out to be devoid of  cartilage.  We then placed a bent Hohmann over the medial acetabular rim and a Cobra retractor laterally. We removed remnants of acetabular labrum and then began reaming under direct visualization from a size 42 reamer all the way up to a size 54. We tried to place the 54 acetabular component, but I could not get it to seat, so we reamed up 1 more mm to size 55 and placed a 56 acetabular component.  This did have a good stick to it then, which is surprising given his other side I only had to place a 52, and this was just a little over year ago.  I then placed 2 screws for extra support and a 36 +4 polyethylene liner for that size, 56 acetabular component.   Attention was then turned to the femur, with the leg externally rotated to 120 degrees, extended and adducted, we began broaching after opening of the femoral canal with a box cutting osteotome and a rongeur to lateralize. I placed a bent Hohmann behind the greater trochanter and a Mueller retractor medially and then began broaching up to a size 12 broach using the Corail broaching system.  Once we got to a size 12, we trialed a varus offset femoral neck and a 36 +1.5 hip ball reduced this acetabulum.  We were pleased with stability, offset, and leg length.  I do feel like it made him a little bit longer, but I am concerned about his obesity and __________ legs such externally rotated, he still has a dislocation risk.  I then dislocated the hip and removed the trial components.  We were able to place the real Corail femoral component size 12 with varus offset and real 36 +1.5 ceramic hip ball.  We reduced this acetabulum, again it was stable.  We were able to then close the joint capsule with interrupted #1 Ethibond suture.  We placed mixture of 20 mL of Exparel, 20 mL of saline, 20 mL of Marcaine around the hip joint itself.  We then closed the iliotibial band with interrupted #1 Vicryl suture followed by 0 Vicryl in the deep tissue, 2-0 Vicryl in subcutaneous tissue, interrupted staples on the skin.  Xeroform and Aquacel dressing were applied.  He was taken off the Hana table, awakened, extubated and taken to the recovery room in stable condition. All final counts were correct.  There were no complications noted.  Of note, Richardean Canal, PA-C, assisted in the entire case.  His assistance was crucial for facilitating all aspects of this case.     Vanita Panda. Magnus Ivan, M.D.     CYB/MEDQ  D:  12/11/2015  T:  12/12/2015  Job:  [id number]

## 2015-12-12 NOTE — Progress Notes (Signed)
Physical Therapy Treatment Note    12/12/15 1500  PT Visit Information  Last PT Received On 12/12/15  Assistance Needed +1  History of Present Illness Pt is a 65 year old male s/p L direct anterior THA with hx of R THA, RA, COPD, obesity, back surgeries, bil TKA  Subjective Data  Subjective Pt with increased pain however agreeable to ambulate as tolerated and assisted back to bed.  Pt agreeable to perform ankle pumps, quad sets, and glut sets later today when pain settles down.  Precautions  Precautions Fall  Restrictions  Other Position/Activity Restrictions WBAT  Pain Assessment  Pain Assessment 0-10  Pain Score 8  Pain Location L hip  Pain Descriptors / Indicators Aching;Sore  Pain Intervention(s) Limited activity within patient's tolerance;Monitored during session;Repositioned;RN gave pain meds during session (RN gave pain meds beginning of session)  Cognition  Arousal/Alertness Awake/alert  Behavior During Therapy WFL for tasks assessed/performed  Overall Cognitive Status Within Functional Limits for tasks assessed  Bed Mobility  Overal bed mobility Needs Assistance  Bed Mobility Sit to Supine  Sit to supine Min assist;HOB elevated  General bed mobility comments assist for L LE, pt utilized rail and trapeze to self assist  Transfers  Overall transfer level Needs assistance  Equipment used Rolling walker (2 wheeled)  Transfers Sit to/from Stand  Sit to Stand Min assist  General transfer comment verbal cues for UE and LE positioning, assist to steady upon rise  Ambulation/Gait  Ambulation/Gait assistance Min guard  Ambulation Distance (Feet) 30 Feet  Assistive device Rolling walker (2 wheeled)  Gait Pattern/deviations Step-to pattern;Antalgic;Decreased stance time - left;Trunk flexed  General Gait Details verbal cues for sequence, step length, posture  Gait velocity decr  PT - End of Session  Equipment Utilized During Treatment Gait belt  Activity Tolerance Patient  limited by pain  Patient left in bed;with call bell/phone within reach;with family/visitor present  PT - Assessment/Plan  PT Plan Current plan remains appropriate  PT Frequency (ACUTE ONLY) 7X/week  Follow Up Recommendations Home health PT  PT equipment None recommended by PT  PT Goal Progression  Progress towards PT goals Progressing toward goals  PT Time Calculation  PT Start Time (ACUTE ONLY) 1416  PT Stop Time (ACUTE ONLY) 1431  PT Time Calculation (min) (ACUTE ONLY) 15 min  PT General Charges  $$ ACUTE PT VISIT 1 Procedure  PT Treatments  $Gait Training 8-22 mins   [name], PT, DPT 12/12/2015 Pager: [phone]

## 2015-12-12 NOTE — Progress Notes (Signed)
Subjective: 1 Day Post-Op Procedure(s) (LRB): LEFT TOTAL HIP ARTHROPLASTY ANTERIOR APPROACH (Left) Patient reports pain as moderate.  Has been up with therapy.  Objective: Vital signs in last 24 hours: Temp:  [97.7 F (36.5 C)-98.7 F (37.1 C)] 98.7 F (37.1 C) (10/07 1019) Pulse Rate:  [69-106] 74 (10/07 1019) Resp:  [10-22] 15 (10/07 1019) BP: (96-141)/(52-88) 130/70 (10/07 1019) SpO2:  [85 %-100 %] 90 % (10/07 1019) Weight:  [138.3 kg (305 lb)] 138.3 kg (305 lb) (10/06 1744)  Intake/Output from previous day: 10/06 0701 - 10/07 0700 In: 3606.8 [P.O.:480; I.V.:3026.8; IV Piggyback:100] Out: 925 [Urine:225; Blood:700] Intake/Output this shift: Total I/O In: 120 [P.O.:120] Out: 175 [Urine:175]   Recent Labs  12/12/15 0456  HGB 10.8*    Recent Labs  12/12/15 0456  WBC 10.8*  RBC 3.07*  HCT 30.1*  PLT 260    Recent Labs  12/12/15 0456  NA 138  K 4.2  CL 103  CO2 29  BUN 23*  CREATININE 0.96  GLUCOSE 100*  CALCIUM 8.7*   No results for input(s): LABPT, INR in the last 72 hours.  Sensation intact distally Intact pulses distally Dorsiflexion/Plantar flexion intact Incision: scant drainage  Assessment/Plan: 1 Day Post-Op Procedure(s) (LRB): LEFT TOTAL HIP ARTHROPLASTY ANTERIOR APPROACH (Left) Up with therapy Discharge home with home health Monday.  Kathryne Hitch 12/12/2015, 11:00 AM

## 2015-12-13 MED ORDER — ASPIRIN 325 MG PO TBEC: 325.0000 mg | DELAYED_RELEASE_TABLET | Freq: Two times a day (BID) | ORAL | 0 refills | Status: DC

## 2015-12-13 MED ORDER — TIZANIDINE HCL 4 MG PO TABS: 4.0000 mg | ORAL_TABLET | Freq: Three times a day (TID) | ORAL | 0 refills | Status: DC | PRN

## 2015-12-13 MED ORDER — PREMIER PROTEIN SHAKE
11.0000 [oz_av] | Freq: Two times a day (BID) | ORAL | Status: DC
  Administered 2015-12-13 – 2015-12-14 (×2): 11 [oz_av] via ORAL
  Filled 2015-12-13: qty 325.31

## 2015-12-13 MED ORDER — PREMIER PROTEIN SHAKE
2.0000 [oz_av] | Freq: Four times a day (QID) | ORAL | Status: DC
  Filled 2015-12-13: qty 325.31

## 2015-12-13 MED ORDER — OXYCODONE HCL 5 MG PO TABS: 5.0000 mg | ORAL_TABLET | ORAL | 0 refills | Status: DC | PRN

## 2015-12-13 NOTE — Progress Notes (Signed)
Subjective: 2 Days Post-Op Procedure(s) (LRB): LEFT TOTAL HIP ARTHROPLASTY ANTERIOR APPROACH (Left) Patient reports pain as moderate.  Slow progress with therapy.  Objective: Vital signs in last 24 hours: Temp:  [98.2 F (36.8 C)-100.3 F (37.9 C)] 99.1 F (37.3 C) (10/08 0621) Pulse Rate:  [74-105] 94 (10/08 0621) Resp:  [15-18] 18 (10/08 0621) BP: (126-151)/(66-84) 129/77 (10/08 0621) SpO2:  [90 %-95 %] 95 % (10/08 0621)  Intake/Output from previous day: 10/07 0701 - 10/08 0700 In: 720 [P.O.:720] Out: 925 [Urine:925] Intake/Output this shift: No intake/output data recorded.   Recent Labs  12/12/15 0456  HGB 10.8*    Recent Labs  12/12/15 0456  WBC 10.8*  RBC 3.07*  HCT 30.1*  PLT 260    Recent Labs  12/12/15 0456  NA 138  K 4.2  CL 103  CO2 29  BUN 23*  CREATININE 0.96  GLUCOSE 100*  CALCIUM 8.7*   No results for input(s): LABPT, INR in the last 72 hours.  Sensation intact distally Intact pulses distally Dorsiflexion/Plantar flexion intact Incision: scant drainage  Assessment/Plan: 2 Days Post-Op Procedure(s) (LRB): LEFT TOTAL HIP ARTHROPLASTY ANTERIOR APPROACH (Left) Up with therapy Plan for discharge tomorrow Discharge home with home health  Kathryne Hitch 12/13/2015, 9:40 AM

## 2015-12-13 NOTE — Progress Notes (Signed)
Nutrition Brief Note  65 y.o. male, has a history of pain and functional disability in the left hip(s) due to arthritis, s/p left total hip arthroplasty on 10/6. 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   RD checked on patient to ensure he is able to follow appropriate post-op diet during hospital admission. Patient reports he has been drinking 2 Premier Protein shakes per day and eating high-protein foods such as low-fat deli meat, low-fat cheese, ground beef, tuna, chili, and Greek yogurt. Reviewed other appropriate protein options available to patient on menu. During hospital admission his wife is bringing in [place] yogurt for him to eat with his Premier Protein shakes to meet protein needs.   Patient already ordered for Premier Protein shakes. Will modify order so he can receive two as needed - wife also bringing in some from home.  CBW is 305 lbs (-27 lbs since resection).   Consult RD if any nutrition-related issues arise.   Helane Rima, MS, RD, LDN Pager: [phone] After Hours Pager: [phone]

## 2015-12-13 NOTE — Progress Notes (Signed)
Physical Therapy Treatment Patient Details Name: Anthony Friedman MRN: [id number] DOB: Nov 19, 1950 Today's Date: 12/13/2015    History of Present Illness Pt is a 65 year old male s/p L direct anterior THA with hx of R THA, RA, COPD, obesity, back surgeries, bil TKA    PT Comments    The patient's progress is slow. Complains of pain. RN aware. Continue PT while in acute care.  Follow Up Recommendations  Home health PT     Equipment Recommendations  None recommended by PT    Recommendations for Other Services       Precautions / Restrictions Precautions Precautions: Fall    Mobility  Bed Mobility   Bed Mobility: Supine to Sit     Supine to sit: Mod assist;HOB elevated     General bed mobility comments: assist for L LE, pt utilized rail and trapeze to self assist  Transfers Overall transfer level: Needs assistance Equipment used: Rolling walker (2 wheeled) Transfers: Sit to/from Stand Sit to Stand: Min assist         General transfer comment: verbal cues for UE and LE positioning, assist to steady upon rise  Ambulation/Gait Ambulation/Gait assistance: Min assist Ambulation Distance (Feet): 30 Feet Assistive device: Rolling walker (2 wheeled) Gait Pattern/deviations: Step-to pattern;Antalgic;Decreased step length - left;Decreased stance time - left Gait velocity: decr   General Gait Details: verbal cues for sequence, step length, posture   Stairs            Wheelchair Mobility    Modified Rankin (Stroke Patients Only)       Balance                                    Cognition Arousal/Alertness: Awake/alert Behavior During Therapy: Anxious                        Exercises    General Comments        Pertinent Vitals/Pain Pain Score: 8  Pain Location: L hip Pain Descriptors / Indicators: Discomfort;Grimacing;Moaning Pain Intervention(s): Monitored during session;Patient requesting pain meds-RN notified;Premedicated  before session    Home Living                      Prior Function            PT Goals (current goals can now be found in the care plan section) Progress towards PT goals: Progressing toward goals    Frequency    7X/week      PT Plan Current plan remains appropriate    Co-evaluation             End of Session   Activity Tolerance: Patient limited by pain Patient left: in chair;with call bell/phone within reach;with nursing/sitter in room     Time: 1140-1158 PT Time Calculation (min) (ACUTE ONLY): 18 min  Charges:  Gait 1``                  G Codes:      Rada Hay 12/13/2015, 1:07 PM Blanchard Kelch PT [phone]

## 2015-12-13 NOTE — Progress Notes (Signed)
Physical Therapy Treatment Patient Details Name: Anthony Friedman MRN: [id number] DOB: [birth year] Today's Date: 12/13/2015    History of Present Illness Pt is a 65 year old male s/p L direct anterior THA with hx of R THA, RA, COPD, obesity, back surgeries, bil TKA    PT Comments    The patient is progressing in mobility this visit. Plans DC tomorrow.  Follow Up Recommendations  Home health PT     Equipment Recommendations  None recommended by PT    Recommendations for Other Services       Precautions / Restrictions Precautions Precautions: Fall    Mobility  Bed Mobility   Bed Mobility: Sit to Supine     Supine to sit: Mod assist;HOB elevated Sit to supine: Min assist;HOB elevated   General bed mobility comments: assist for L LE,   Transfers Overall transfer level: Needs assistance Equipment used: Rolling walker (2 wheeled) Transfers: Sit to/from Stand Sit to Stand: Min guard         General transfer comment: verbal cues for UE and LE positioning,   Ambulation/Gait Ambulation/Gait assistance: Min assist Ambulation Distance (Feet): 60 Feet Assistive device: Rolling walker (2 wheeled) Gait Pattern/deviations: Step-to pattern Gait velocity: decr   General Gait Details: verbal cues for sequence, step length, posture   Stairs            Wheelchair Mobility    Modified Rankin (Stroke Patients Only)       Balance                                    Cognition Arousal/Alertness: Awake/alert Behavior During Therapy: Anxious                        Exercises     General Comments        Pertinent Vitals/Pain Pain Score: 8  Pain Location: l hip Pain Descriptors / Indicators: Aching;Grimacing;Discomfort;Moaning Pain Intervention(s): Monitored during session;Premedicated before session;Patient requesting pain meds-RN notified;Ice applied;Repositioned    Home Living                      Prior Function             PT Goals (current goals can now be found in the care plan section) Progress towards PT goals: Progressing toward goals    Frequency    7X/week      PT Plan Current plan remains appropriate    Co-evaluation             End of Session   Activity Tolerance: Patient tolerated treatment well Patient left: in bed;with call bell/phone within reach;with family/visitor present     Time: 1456-1531 PT Time Calculation (min) (ACUTE ONLY): 35 min  Charges:  $Gait Training: 23-37 mins $Therapeutic Exercise: 8-22 mins                    G Codes:      Sharen Heck PT [phone]  12/13/2015, 3:52 PM

## 2015-12-13 NOTE — Progress Notes (Signed)
Physical Therapy Treatment Patient Details Name: Anthony Friedman MRN: [id number] DOB: 01/28/1951 Today's Date: 12/13/2015    History of Present Illness Pt is a 65 year old male s/p L direct anterior THA with hx of R THA, RA, COPD, obesity, back surgeries, bil TKA    PT Comments    The patient requested to rest before mobilizing.  Follow Up Recommendations  Home health PT     Equipment Recommendations  None recommended by PT    Recommendations for Other Services       Precautions / Restrictions Precautions Precautions: Fall    Mobility  Bed Mobility                  Transfers                    Ambulation/Gait                 Stairs            Wheelchair Mobility    Modified Rankin (Stroke Patients Only)       Balance                                    Cognition   Behavior During Therapy: Anxious                        Exercises Total Joint Exercises Ankle Circles/Pumps: AROM;Both;10 reps Quad Sets: AROM;Both;10 reps Short Arc Quad: AROM;Left;10 reps Heel Slides: AAROM;Left;10 reps Hip ABduction/ADduction: AAROM;Left;10 reps    General Comments        Pertinent Vitals/Pain Pain Score: 8  Pain Location: L hip Pain Descriptors / Indicators: Discomfort;Grimacing;Guarding;Moaning Pain Intervention(s): Premedicated before session;Monitored during session;Repositioned;Ice applied    Home Living                      Prior Function            PT Goals (current goals can now be found in the care plan section) Progress towards PT goals: Progressing toward goals    Frequency    7X/week      PT Plan Current plan remains appropriate    Co-evaluation             End of Session   Activity Tolerance: Patient limited by pain Patient left: in bed;with call bell/phone within reach;with family/visitor present     Time: 1118-1130 PT Time Calculation (min) (ACUTE ONLY): 12  min  Charges:  $Therapeutic Exercise: 8-22 mins                    G Codes:      Rada Hay 12/13/2015, 1:04 PM

## 2015-12-13 NOTE — Discharge Instructions (Signed)

## 2015-12-14 ENCOUNTER — Encounter (HOSPITAL_COMMUNITY): Payer: Self-pay | Admitting: Orthopaedic Surgery

## 2015-12-14 NOTE — Progress Notes (Signed)
  Physical Therapy Treatment Patient Details Name: Anthony Friedman MRN: [id number] DOB: 08/26/1950 Today's Date: 12/14/2015    History of Present Illness Pt is a 65 year old male s/p L direct anterior THA with hx of R THA, RA, COPD, obesity, back surgeries, bil TKA    PT Comments    POD # 3 Assisted OOB to amb a limited distance then positioned in elevated recliner.  HEP given and ICE. Pt ready for D/C to home  Follow Up Recommendations  Home health PT     Equipment Recommendations  None recommended by PT    Recommendations for Other Services       Precautions / Restrictions Precautions Precautions: Fall Restrictions Weight Bearing Restrictions: No Other Position/Activity Restrictions: WBAT    Mobility  Bed Mobility Overal bed mobility: Needs Assistance Bed Mobility: Supine to Sit     Supine to sit: Min assist     General bed mobility comments: assist for L LE, increased time  Transfers Overall transfer level: Needs assistance Equipment used: Rolling walker (2 wheeled) Transfers: Sit to/from Stand Sit to Stand: Supervision;Min guard         General transfer comment: verbal cues for UE and LE positioning, increased time  Ambulation/Gait Ambulation/Gait assistance: Supervision;Min guard Ambulation Distance (Feet): 24 Feet Assistive device: Rolling walker (2 wheeled) Gait Pattern/deviations: Step-to pattern;Wide base of support Gait velocity: decr   General Gait Details: verbal cues for sequence, step length, posture   Stairs            Wheelchair Mobility    Modified Rankin (Stroke Patients Only)       Balance                                    Cognition Arousal/Alertness: Awake/alert Behavior During Therapy: WFL for tasks assessed/performed Overall Cognitive Status: Within Functional Limits for tasks assessed                      Exercises      General Comments        Pertinent Vitals/Pain Pain Assessment:  0-10 Pain Score: 8  Pain Location: L hip Pain Descriptors / Indicators: Aching;Grimacing;Sore;Tender Pain Intervention(s): Monitored during session;Repositioned;Ice applied    Home Living                      Prior Function            PT Goals (current goals can now be found in the care plan section) Progress towards PT goals: Progressing toward goals    Frequency    7X/week      PT Plan Current plan remains appropriate    Co-evaluation             End of Session Equipment Utilized During Treatment: Gait belt Activity Tolerance: Patient tolerated treatment well Patient left: in chair;with call bell/phone within reach     Time: 1119-1150 PT Time Calculation (min) (ACUTE ONLY): 31 min  Charges:  $Gait Training: 8-22 mins $Therapeutic Activity: 8-22 mins                    G Codes:      Felecia Shelling  PTA [facility]  Acute  Rehab Pager      [phone]

## 2015-12-14 NOTE — Care Management Note (Signed)
Case Management Note  Patient Details  Name: Anthony Friedman MRN: [id number] Date of Birth: [birth year]  Subjective/Objective: 65 y/o m admitted w/PA L hip. S/p L THA. From home. PT-recc HHPT. [facility] chosen by [facility] rep-Karen aware, & accepted referral, aware of d/c home today w/HHPT. Patient has all DME. No further CM needs.                   Action/Plan:d/c home w/HHC.   Expected Discharge Date:                  Expected Discharge Plan:  Home w Home Health Services  In-House Referral:     Discharge planning Services  CM Consult  Post Acute Care Choice:    Choice offered to:  Patient  DME Arranged:    DME Agency:     HH Arranged:  PT HH Agency:  [facility]  Status of Service:  Completed, signed off  If discussed at Long Length of Stay Meetings, dates discussed:    Additional Comments:  Lanier Clam, RN 12/14/2015, 12:06 PM

## 2015-12-14 NOTE — Progress Notes (Signed)
Patient ID: Anthony Friedman, male   DOB: 1950-06-05, 65 y.o.   MRN: [id number] Doing well overall.  Can be discharged to home today.

## 2015-12-14 NOTE — Discharge Summary (Signed)
Patient ID: Anthony Friedman MRN: [id number] DOB/AGE: Mar 25, 1950 65 y.o.  Admit date: 12/11/2015 Discharge date: 12/14/2015  Admission Diagnoses:  Principal Problem:   Osteoarthritis of left hip Active Problems:   Status post left hip replacement   Discharge Diagnoses:  Same  Past Medical History:  Diagnosis Date  . Atelectasis   . 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 Chronic allergic rhinitis   . Chronic venous insufficiency    superficial  . COPD (chronic obstructive pulmonary disease) (HCC)   . Cough   . Diabetes mellitus without complication (HCC)    not on medications since  surgery 10/2015   . Edema    lower extremities  . Elevated liver enzymes    d/t arava  . Family history of adverse reaction to anesthesia    pts mother had difficulty with anesthesia - pt not sure what difficulties were   . Hypertension    no longer on blood pressure medications since 10/25/2015 per patient   . Kidney stones    hx of   . Leg pain   . Liver enlargement    d/t arava  . Obesity   . Pallor   . Pneumonia    hx of times 3; pt states is current with pneumonia vaccine  . RA (rheumatoid arthritis) (HCC)     rheumatoid arthritis   . Sleep apnea    bipap  . SOBOE (shortness of breath on exertion)   . Superficial thrombophlebitis    subacute-on anticoagulation  . Urinary frequency   . Urinary tract bacterial infections    hx of   . Venous stasis    changes    Surgeries: Procedure(s): LEFT TOTAL HIP ARTHROPLASTY ANTERIOR APPROACH on 12/11/2015   Consultants:   Discharged Condition: Improved  Hospital Course: Harl Wiechmann is an 65 y.o. male who was admitted 12/11/2015 for operative treatment ofOsteoarthritis of left hip. [name], daily activities, and work/hobbies. After pre-op clearance the patient was taken to the operating room on 12/11/2015 and underwent  Procedure(s): LEFT TOTAL HIP  ARTHROPLASTY ANTERIOR APPROACH.    Patient was given perioperative antibiotics: Anti-infectives    Start     Dose/Rate Route Frequency Ordered Stop   12/11/15 2000  ceFAZolin (ANCEF) IVPB 2g/100 mL premix     2 g 200 mL/hr over 30 Minutes Intravenous Every 6 hours 12/11/15 1748 12/12/15 0300   12/11/15 0600  ceFAZolin (ANCEF) 3 g in dextrose 5 % 50 mL IVPB     3 g 130 mL/hr over 30 Minutes Intravenous On call to O.R. 12/10/15 1403 12/11/15 1420       Patient was given sequential compression devices, early ambulation, and chemoprophylaxis to prevent DVT.  Patient benefited maximally from hospital stay and there were no complications.    Recent vital signs: Patient Vitals for the past 24 hrs:  BP Temp Temp src Pulse Resp SpO2  12/14/15 0621 (!) 140/91 98.6 F (37 C) Oral (!) 110 16 95 %  12/13/15 1950 (!) 141/76 99.5 F (37.5 C) Oral 93 18 97 %  12/13/15 1323 (!) 126/58 98.7 F (37.1 C) Oral 98 18 96 %     Recent laboratory studies:  Recent Labs  12/12/15 0456  WBC 10.8*  HGB 10.8*  HCT 30.1*  PLT 260  NA 138  K 4.2  CL 103  CO2 29  BUN 23*  CREATININE 0.96  GLUCOSE 100*  CALCIUM 8.7*  Discharge Medications:     Medication List    TAKE these medications   acetaminophen 500 MG tablet Commonly known as:  TYLENOL Take 500-2,000 mg by mouth daily as needed for moderate pain.   aspirin 325 MG EC tablet Take 1 tablet (325 mg total) by mouth 2 (two) times daily after a meal.   B-12 500 MCG Subl Place 500 mcg under the tongue daily.   CALCIUM 1000 + D 1000-800 MG-UNIT Tabs Generic drug:  Calcium Carb-Cholecalciferol Take 1 tablet by mouth 3 (three) times daily.   clotrimazole-betamethasone cream Commonly known as:  LOTRISONE Apply 1 application topically daily as needed (rash).   diphenhydrAMINE 12.5 MG/5ML elixir Commonly known as:  BENADRYL Take 25 mg by mouth at bedtime as needed for allergies or sleep.   FLOMAX 0.4 MG Caps capsule Generic drug:   tamsulosin Take 0.4 mg by mouth every morning.   fluticasone furoate-vilanterol 100-25 MCG/INH Aepb Commonly known as:  BREO ELLIPTA Inhale 1 puff into the lungs daily.   folic acid 1 MG tablet Commonly known as:  FOLVITE Take 1 tablet by mouth every morning.   furosemide 20 MG tablet Commonly known as:  LASIX Take 20 mg by mouth daily.   Methotrexate (PF) 20 MG/0.4ML Soaj Inject into the skin once a week. Last injection on 10/09/2015   mometasone-formoterol 200-5 MCG/ACT Aero Commonly known as:  DULERA Inhale 2 puffs into the lungs 2 (two) times daily.   multivitamin tablet Take 1 tablet by mouth 2 (two) times daily.   oxyCODONE 5 MG immediate release tablet Commonly known as:  Oxy IR/ROXICODONE Take 1-3 tablets (5-15 mg total) by mouth every 3 (three) hours as needed for breakthrough pain. What changed:  medication strength  how much to take  when to take this  reasons to take this   polyethylene glycol packet Commonly known as:  MIRALAX / GLYCOLAX Take 17 g by mouth 2 (two) times daily as needed for moderate constipation.   PROAIR HFA 108 (90 Base) MCG/ACT inhaler Generic drug:  albuterol Inhale 2 puffs into the lungs every 6 (six) hours as needed for wheezing.   psyllium 58.6 % powder Commonly known as:  METAMUCIL Take 1 packet by mouth daily as needed (constipation).   rOPINIRole 1 MG tablet Commonly known as:  REQUIP Take 1 tablet by mouth daily as needed (restless leg.).   tiZANidine 4 MG tablet Commonly known as:  ZANAFLEX Take 1 tablet (4 mg total) by mouth every 8 (eight) hours as needed for muscle spasms.       Diagnostic Studies: Dg C-arm 1-60 Min-no Report  Result Date: 12/11/2015 CLINICAL DATA: surgery C-ARM 1-60 MINUTES Fluoroscopy was utilized by the requesting physician.  No radiographic interpretation.   Dg Hip Port Unilat With Pelvis 1v Left  Result Date: 12/11/2015 CLINICAL DATA:  Left anterior hip replacement EXAM: DG HIP (WITH OR  WITHOUT PELVIS) 1V PORT LEFT COMPARISON:  Same day FINDINGS: Three views show left total hip arthroplasty. Acetabular anchoring screws. One screw does breach the medial ileal cortex. IMPRESSION: Left total hip replacement as described. Electronically Signed   By: Paulina Fusi M.D.   On: 12/11/2015 16:55   Dg Hip Operative Unilat W Or W/o Pelvis Left  Result Date: 12/11/2015 CLINICAL DATA:  Left hip replacement. EXAM: OPERATIVE LEFT HIP (WITH PELVIS IF PERFORMED)  VIEWS TECHNIQUE: Fluoroscopic spot image(s) were submitted for interpretation post-operatively. COMPARISON:  11/19/2015 FINDINGS: Two intraoperative views demonstrating a left hip arthroplasty. No acute hardware  complication. Remote right hip arthroplasty also incidentally partially imaged. IMPRESSION: Intraoperative imaging of left hip arthroplasty. Electronically Signed   By: Jeronimo Greaves M.D.   On: 12/11/2015 16:03    Disposition: 01-Home or Self Care  Discharge Instructions    Call MD / Call 911    Complete by:  As directed    If you experience chest pain or shortness of breath, CALL 911 and be transported to the hospital emergency room.  If you develope a fever above 101 F, pus (white drainage) or increased drainage or redness at the wound, or calf pain, call your surgeon's office.   Constipation Prevention    Complete by:  As directed    Drink plenty of fluids.  Prune juice may be helpful.  You may use a stool softener, such as Colace (over the counter) 100 mg twice a day.  Use MiraLax (over the counter) for constipation as needed.   Diet - low sodium heart healthy    Complete by:  As directed    Discharge patient    Complete by:  As directed    Increase activity slowly as tolerated    Complete by:  As directed       Follow-up Information    Kathryne Hitch, MD Follow up in 2 week(s).   Specialty:  Orthopedic Surgery Contact information: [address] [place] [address] [phone]             Signed: Kathryne Hitch 12/14/2015, 7:31 AM

## 2015-12-16 ENCOUNTER — Ambulatory Visit: Payer: Medicare HMO | Admitting: Pulmonary Disease

## 2015-12-16 ENCOUNTER — Encounter (HOSPITAL_COMMUNITY): Payer: Self-pay | Admitting: Orthopaedic Surgery

## 2015-12-21 ENCOUNTER — Ambulatory Visit: Payer: Medicare HMO | Admitting: Dietician

## 2015-12-24 ENCOUNTER — Inpatient Hospital Stay (INDEPENDENT_AMBULATORY_CARE_PROVIDER_SITE_OTHER): Payer: Medicare HMO | Admitting: Orthopaedic Surgery

## 2015-12-24 DIAGNOSIS — M1611 Unilateral primary osteoarthritis, right hip: Secondary | ICD-10-CM

## 2015-12-24 DIAGNOSIS — M1612 Unilateral primary osteoarthritis, left hip: Secondary | ICD-10-CM

## 2015-12-28 ENCOUNTER — Inpatient Hospital Stay (INDEPENDENT_AMBULATORY_CARE_PROVIDER_SITE_OTHER): Payer: Medicare HMO | Admitting: Orthopaedic Surgery

## 2015-12-28 ENCOUNTER — Encounter: Payer: Medicare HMO | Attending: Surgery | Admitting: Dietician

## 2015-12-28 DIAGNOSIS — I10 Essential (primary) hypertension: Secondary | ICD-10-CM | POA: Diagnosis not present

## 2015-12-28 DIAGNOSIS — Z731 Type A behavior pattern: Secondary | ICD-10-CM | POA: Diagnosis not present

## 2015-12-28 DIAGNOSIS — E119 Type 2 diabetes mellitus without complications: Secondary | ICD-10-CM | POA: Insufficient documentation

## 2015-12-28 DIAGNOSIS — J449 Chronic obstructive pulmonary disease, unspecified: Secondary | ICD-10-CM | POA: Insufficient documentation

## 2015-12-28 NOTE — Patient Instructions (Addendum)
Goals:  Follow Phase 3B: High Protein + Non-Starchy Vegetables  Eat 3-6 small meals/snacks, every 3-5 hrs  Increase lean protein foods to meet 80g goal  Increase fluid intake to 64oz +  Avoid drinking 15 minutes before, during and 30 minutes after eating  Aim for >30 min of physical activity daily   Surgery date: 10/26/2015 Surgery type: sleeve gastrectomy Start weight at [facility]: 336.5 lbs on 09/09/2015 Weight today: 295.1 lbs  Weight change: 17.8 lbs Total weight loss: 41.4 lbs

## 2015-12-28 NOTE — Progress Notes (Signed)
  Follow-up visit:  8 Weeks Post-Operative Sleeve Gastrectomy Surgery  Medical Nutrition Therapy:  Appt start time: 1030 end time:  1105.  Primary concerns today: Post-operative Bariatric Surgery Nutrition Management. Returns with a 17.8 lb weight loss. Had hip replacement surgery in early October. Having in home physical therapy.   Does not have much of an appetite. Relies on protein shakes and yogurt for protein for the most part. Feels like he is meeting protein and fluid goals.    Does not like most vegetables except salad.    TANITA  BODY COMP RESULTS  10/05/15 11/10/15 12/28/15   BMI (kg/m^2) N/A N/A N/A   Fat Mass (lbs)      Fat Free Mass (lbs)      Total Body Water (lbs)       Preferred Learning Style:   No preference indicated   Learning Readiness:   Ready  24-hr recall: B (AM): Premier Protein (30 g) Snk (AM): none  L (PM): triple zero Yogurt or cheese or 3 oz deli meat or chili sometimes with sandwich thin/crackers (15-21 g) Snk (PM): Premier Protein (30 g) D (PM): Yogurt or cheese or deli meat or chili or tuna (15-21 g) Snk (PM): none or SF popsicle or 2 oz sherbert  Fluid intake: 22 oz protein shake, 16 oz decaf iced tea, 16 oz crystal light with metamucil/miralax, sips water (feels like he is over 64 oz fluid)   Estimated total protein intake: 90 g   Medications: see list Supplementation: taking  Using straws: No Drinking while eating: very minimal if something is salty, tries to wait 30 minutes to drink Hair loss: no Carbonated beverages: No N/V/D/C: has constipation (taking painkillers) and taking metamucil and miralax  Dumping syndrome: No  Recent physical activity:  none  Progress Towards Goal(s):  In progress.  Handouts given during visit include:  Phase 3B High Protein + Non Starchy Vegetables   Nutritional Diagnosis:  [place]-3.3 Overweight/obesity related to past poor dietary habits and physical inactivity as evidenced by patient w/ recent sleeve  gastrectomy surgery following dietary guidelines for continued weight loss.    Intervention:  Nutrition education/diet advancement. Goals:  Follow Phase 3B: High Protein + Non-Starchy Vegetables  Eat 3-6 small meals/snacks, every 3-5 hrs  Increase lean protein foods to meet 80g goal  Increase fluid intake to 64oz +  Avoid drinking 15 minutes before, during and 30 minutes after eating  Aim for >30 min of physical activity daily   Teaching Method Utilized:  Visual Auditory Hands on  Barriers to learning/adherence to lifestyle change: none  Demonstrated degree of understanding via:  Teach Back   Monitoring/Evaluation:  Dietary intake, exercise, and body weight. Follow up in 2 months for 5 month post-op visit.

## 2016-01-05 ENCOUNTER — Telehealth: Payer: Self-pay | Admitting: Pulmonary Disease

## 2016-01-05 ENCOUNTER — Telehealth (INDEPENDENT_AMBULATORY_CARE_PROVIDER_SITE_OTHER): Payer: Self-pay | Admitting: *Deleted

## 2016-01-05 MED ORDER — FLUTICASONE FUROATE-VILANTEROL 100-25 MCG/INH IN AEPB: 1.0000 | INHALATION_SPRAY | Freq: Every day | RESPIRATORY_TRACT | 5 refills | Status: DC

## 2016-01-05 NOTE — Telephone Encounter (Signed)
[facility] Deis [facility] and is wondering if we can extend the order to Physical Therapy to 1 time a week for  2 more week to continue strengthening the left hip.  She states you can either fax her the order or call( [phone]) . The Fax number is  [phone] . Thank you .

## 2016-01-05 NOTE — Telephone Encounter (Signed)
Called and spoke with pt and he is aware of refill of the breo that has been sent to his pharmacy. Nothing further is needed.

## 2016-01-06 NOTE — Telephone Encounter (Signed)
Left message on machine verbal order for this therapy

## 2016-01-21 ENCOUNTER — Ambulatory Visit (INDEPENDENT_AMBULATORY_CARE_PROVIDER_SITE_OTHER): Payer: Medicare HMO | Admitting: Orthopaedic Surgery

## 2016-01-21 DIAGNOSIS — Z96642 Presence of left artificial hip joint: Secondary | ICD-10-CM

## 2016-01-21 NOTE — Progress Notes (Signed)
[name] is between 5 and 6 weeks post a left total hip arthroplasty. He exam Anthony Friedman with walking sticks. He says his hips are doing well. He still recovering from gastric sleeve surgery and working on weight loss.  On examination of his left hip there is small wound at the top of the incision but is very small no evidence of infection. His leg lengths are equal. He is removing his hip around easily.  At this point I want him to place Bactroban ointment which she has at home on that small wound daily and keep his incision clean and dry otherwise. We'll see him back in a month to see how is doing overall but no x-rays are needed.

## 2016-02-02 ENCOUNTER — Encounter: Payer: Self-pay | Admitting: Pulmonary Disease

## 2016-02-02 DIAGNOSIS — G4733 Obstructive sleep apnea (adult) (pediatric): Secondary | ICD-10-CM | POA: Insufficient documentation

## 2016-02-10 ENCOUNTER — Encounter: Payer: Self-pay | Admitting: Pulmonary Disease

## 2016-02-10 ENCOUNTER — Ambulatory Visit (INDEPENDENT_AMBULATORY_CARE_PROVIDER_SITE_OTHER): Payer: Medicare HMO | Admitting: Pulmonary Disease

## 2016-02-10 DIAGNOSIS — J449 Chronic obstructive pulmonary disease, unspecified: Secondary | ICD-10-CM | POA: Diagnosis not present

## 2016-02-10 NOTE — Progress Notes (Signed)
Subjective:    Patient ID: Anthony Friedman, male    DOB: 01-Apr-1950, 65 y.o.   MRN: [id number]  Synopsis: Former patient of Dr. Delford Field listed as having COPD who first established care with Anthony Friedman in 08/2015. Spirometry 10/25/2012: FEV1 51% predicted FVC 49% predicted FEV1 FVC ratio 82% predicted FEF 25-75   63% predicted Spirometry 04/2015 > no airflow obstruction, Ratio 72%, FEV1 1.48 42%, FVC 45% He quit smoking after 40 years of smoking as much as 2ppd in 2012, he briefly smoked again in 2015.    HPI Chief Complaint  Patient presents with  . Follow-up    4 month follow up. Breathing has been ok. No chest pain or shortness of breath.     Anthony Friedman has recovered from his surgeries (bariattric and hip).  He has been working out in the [place] [facility] in the pool.  He has been doing well with that.  He would like to start weight training.  He has a lot of difficult walking outside the pool mostly due to his hip and other joint issues.    His COPD has been OK during this time.  He changed from [facility] to [facility] which is good.  He says that he is feeling fine and not having any significant dyspnea.  He had his surgery at [facility] and had no post op pulmonary complications.  He had an issue with his atenalol where his HR went down.  No bronchitis.  He had his flu shot (high dose) this year with his PCP Anthony Laster NP as part of [facility].     Past Medical History:  Diagnosis Date  . Atelectasis   . 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 Chronic allergic rhinitis   . Chronic venous insufficiency    superficial  . COPD (chronic obstructive pulmonary disease) (HCC)   . Cough   . Diabetes mellitus without complication (HCC)    not on medications since  surgery 10/2015   . Edema    lower extremities  . Elevated liver enzymes    d/t arava  . Family history of adverse reaction to anesthesia    pts mother had difficulty with anesthesia - pt not  sure what difficulties were   . Hypertension    no longer on blood pressure medications since 10/25/2015 per patient   . Kidney stones    hx of   . Leg pain   . Liver enlargement    d/t arava  . Obesity   . Pallor   . Pneumonia    hx of times 3; pt states is current with pneumonia vaccine  . RA (rheumatoid arthritis) (HCC)     rheumatoid arthritis   . Sleep apnea    bipap  . SOBOE (shortness of breath on exertion)   . Superficial thrombophlebitis    subacute-on anticoagulation  . Urinary frequency   . Urinary tract bacterial infections    hx of   . Venous stasis    changes      Review of Systems  Constitutional: Negative for chills, fatigue and fever.  HENT: Negative for postnasal drip, rhinorrhea and sinus pressure.   Respiratory: Positive for shortness of breath. Negative for cough and wheezing.   Cardiovascular: Negative for chest pain, palpitations and leg swelling.       Objective:   Physical Exam Vitals:   02/10/16 0942  BP: 118/72  Pulse: 71  SpO2: 97%  Weight: 295  lb (133.8 kg)  Height: 5\' 8"  (1.727 m)   RA  Gen: obese but well appearing HENT: OP clear, TM's clear, neck supple PULM: CTA B, normal percussion CV: RRR, no mgr, trace edema GI: BS+, soft, nontender Derm: no cyanosis or rash Psyche: normal mood and affect       Assessment & Plan:  COPD gold stage C. This is been a stable interval for [place]. He has not had an exacerbation and he has no dyspnea. He came through his surgeries without any difficulty from a pulmonary standpoint.  He has mild airflow obstruction.  His immunizations are up-to-date.  He has no complication from taking Breo.  Plan: Continue Breo daily Exercise regularly Follow-up one year    Current Outpatient Prescriptions:  .  acetaminophen (TYLENOL) 500 MG tablet, Take 500-2,000 mg by mouth daily as needed for moderate pain., Disp: , Rfl:  .  albuterol (PROAIR HFA) 108 (90 BASE) MCG/ACT inhaler, Inhale 2 puffs  into the lungs every 6 (six) hours as needed for wheezing., Disp: , Rfl:  .  aspirin EC 325 MG EC tablet, Take 1 tablet (325 mg total) by mouth 2 (two) times daily after a meal., Disp: 30 tablet, Rfl: 0 .  Calcium Carb-Cholecalciferol (CALCIUM 1000 + D) 1000-800 MG-UNIT TABS, Take 1 tablet by mouth 3 (three) times daily. , Disp: , Rfl:  .  clotrimazole-betamethasone (LOTRISONE) cream, Apply 1 application topically daily as needed (rash). , Disp: , Rfl:  .  Cyanocobalamin (B-12) 500 MCG SUBL, Place 500 mcg under the tongue See admin instructions. Take twice a week, Disp: , Rfl:  .  diphenhydrAMINE (BENADRYL) 12.5 MG/5ML elixir, Take 25 mg by mouth at bedtime as needed for allergies or sleep., Disp: , Rfl:  .  fluticasone furoate-vilanterol (BREO ELLIPTA) 100-25 MCG/INH AEPB, Inhale 1 puff into the lungs daily., Disp: 60 each, Rfl: 5 .  folic acid (FOLVITE) 1 MG tablet, Take 1 tablet by mouth every morning. , Disp: , Rfl:  .  Methotrexate, PF, 20 MG/0.4ML SOAJ, Inject into the skin once a week. Last injection on 10/09/2015, Disp: , Rfl:  .  Multiple Vitamin (MULTIVITAMIN) tablet, Take 1 tablet by mouth 2 (two) times daily. , Disp: , Rfl:  .  oxyCODONE (OXY IR/ROXICODONE) 5 MG immediate release tablet, Take 1-3 tablets (5-15 mg total) by mouth every 3 (three) hours as needed for breakthrough pain., Disp: 90 tablet, Rfl: 0 .  polyethylene glycol (MIRALAX / GLYCOLAX) packet, Take 17 g by mouth 2 (two) times daily as needed for moderate constipation., Disp: , Rfl:  .  psyllium (METAMUCIL) 58.6 % powder, Take 1 packet by mouth daily as needed (constipation)., Disp: , Rfl:  .  rOPINIRole (REQUIP) 1 MG tablet, Take 1 tablet by mouth daily as needed (restless leg.). , Disp: , Rfl:  .  Tamsulosin HCl (FLOMAX) 0.4 MG CAPS, Take 0.4 mg by mouth every morning. , Disp: , Rfl:  .  furosemide (LASIX) 20 MG tablet, Take 20 mg by mouth daily. , Disp: , Rfl:

## 2016-02-10 NOTE — Patient Instructions (Signed)
Keep doing what you are doing Exercise regularly Keep taking your Breo as you are doing We will see you back in one year or sooner if needed

## 2016-02-10 NOTE — Assessment & Plan Note (Signed)
This is been a stable interval for [place]. He has not had an exacerbation and he has no dyspnea. He came through his surgeries without any difficulty from a pulmonary standpoint.  He has mild airflow obstruction.  His immunizations are up-to-date.  He has no complication from taking Breo.  Plan: Continue Breo daily Exercise regularly Follow-up one year

## 2016-02-18 ENCOUNTER — Ambulatory Visit (INDEPENDENT_AMBULATORY_CARE_PROVIDER_SITE_OTHER): Payer: Medicare HMO | Admitting: Orthopaedic Surgery

## 2016-02-18 DIAGNOSIS — Z96642 Presence of left artificial hip joint: Secondary | ICD-10-CM

## 2016-02-18 NOTE — Progress Notes (Signed)
[name] is now just over 2 months status post a left total hip arthroplasty. He has gone to gastric bypass surgery before his hip replacement not so about 4 months out now from that surgery. He's lost about 70 pounds overall. We have also replaced his right hip in June 2016. He said the right hip is doing great but he still struggles with a left hip. He walks with walking sticks.  On examination of his left hip he does have pain with rotation of the hip his incision no is well-healed. His leg lengths are equal.  At this point a continued increase his activities. We'll see him back in 6 months with a low AP pelvis at that visit. If he has problems before then he knows to give [place] a call and come see [place].

## 2016-04-20 ENCOUNTER — Ambulatory Visit (INDEPENDENT_AMBULATORY_CARE_PROVIDER_SITE_OTHER): Payer: Medicare HMO

## 2016-04-20 ENCOUNTER — Ambulatory Visit (INDEPENDENT_AMBULATORY_CARE_PROVIDER_SITE_OTHER): Payer: Medicare HMO | Admitting: Physician Assistant

## 2016-04-20 ENCOUNTER — Telehealth (INDEPENDENT_AMBULATORY_CARE_PROVIDER_SITE_OTHER): Payer: Self-pay | Admitting: Orthopaedic Surgery

## 2016-04-20 DIAGNOSIS — M25552 Pain in left hip: Secondary | ICD-10-CM

## 2016-04-20 DIAGNOSIS — M25562 Pain in left knee: Secondary | ICD-10-CM

## 2016-04-20 DIAGNOSIS — G8929 Other chronic pain: Secondary | ICD-10-CM | POA: Diagnosis not present

## 2016-04-20 MED ORDER — METHYLPREDNISOLONE ACETATE 40 MG/ML IJ SUSP
40.0000 mg | INTRAMUSCULAR | Status: AC | PRN
  Administered 2016-04-20: 40 mg via INTRA_ARTICULAR

## 2016-04-20 MED ORDER — LIDOCAINE HCL 1 % IJ SOLN
3.0000 mL | INTRAMUSCULAR | Status: AC | PRN
  Administered 2016-04-20: 3 mL

## 2016-04-20 NOTE — Telephone Encounter (Signed)
Please advise 

## 2016-04-20 NOTE — Telephone Encounter (Signed)
Pt was seen this morning and wants to know if he can have an RX for pain..  [phone]

## 2016-04-20 NOTE — Progress Notes (Signed)
Office Visit Note   Patient: Anthony Friedman           Date of Birth: 02-Aug-1950           MRN: [id number] Visit Date: 04/20/2016              Requested by: Lizbeth Bark, NP [address] [place], [place] [address] PCP: Smothers, Cathleen Corti, NP   Assessment & Plan: Visit Diagnoses:  1. Chronic pain of left knee   2. Pain in left hip     Plan: He'll perform IT band stretching exercises. Like to see him back in a month check his progress or lack of. Questions encouraged and answered.  Follow-Up Instructions: Return in about 4 weeks (around 05/18/2016).   Orders:  Orders Placed This Encounter  Procedures  . Large Joint Injection/Arthrocentesis  . XR Knee 1-2 Views Left  . XR HIP UNILAT W OR W/O PELVIS 1V LEFT   No orders of the defined types were placed in this encounter.     Procedures: Large Joint Inj Date/Time: 04/20/2016 9:26 AM Performed by: Kirtland Bouchard Authorized by: Kirtland Bouchard   Consent Given by:  Patient Indications:  Pain Location:  Hip Site:  L greater trochanter Needle Size:  22 G Needle Length:  1.5 inches and 3.5 inches Approach:  Lateral Ultrasound Guidance: No   Fluoroscopic Guidance: No   Arthrogram: No   Medications:  40 mg methylPREDNISolone acetate 40 MG/ML; 3 mL lidocaine 1 % Aspiration Attempted: No   Patient tolerance:  Patient tolerated the procedure well with no immediate complications     Clinical Data: No additional findings.   Subjective: No chief complaint on file.   HPI She Concannon is now 4 months status post left total hip arthroplasty. He's having pain in his left knee which he had a total knee replacement of some time in the past by another provider. Also is having left hip pain. He has pain whenever he lies on the left hip. He states that the pain has not stopped her in the lateral aspect the hip in the groin since surgery. He's having no radicular symptoms down the left leg otherwise. Review of  Systems   Objective: Vital Signs: There were no vitals taken for this visit.  Physical Exam Well develop well-nourished male overweight in no acute distress does ambulate with a cane. Mood and affect appropriate. Ortho Exam Left Knee has good range of motion of the knee no effusion no abnormal warmth tenderness along the lateral medial joint line. No instability valgus varus stressing. Surgical incisions well-healed. Left hip he has good range of motion of the hip internal rotation causes pain in the groin area. Has tenderness over the trochanteric region. Left hip surgical incision is well-healed no signs of infection. Specialty Comments:  No specialty comments available.  Imaging: Xr Hip Unilat W Or W/o Pelvis 1v Left  Result Date: 04/20/2016 AP pelvis and lateral view of the left hip shows patient is status post bilateral total hip arthroplasties. Bilateral hips are well located. There is no evidence of hardware failure or loosening components. No other fractures identified throughout the pelvis.  Xr Knee 1-2 Views Left  Result Date: 04/20/2016 Left Knee 3 views: S post left total knee arthroplasty all components are well seated no signs. No acute fractures. No bony abnormalities. Overall excellent position alignment    PMFS History: Patient Active Problem List   Diagnosis Date Noted  . OSA (obstructive  sleep apnea) 02/02/2016  . Osteoarthritis of left hip 12/11/2015  . Status post left hip replacement 12/11/2015  . Bradycardia 10/28/2015  . Morbid obesity (HCC) 10/26/2015  . Pre-op evaluation 04/16/2015  . Osteoarthritis of right hip 08/08/2014  . Status post total replacement of right hip 08/08/2014  . 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 Edema, peripheral 10/29/2013  . Insufficiency, vascular 10/29/2013  . Cervical osteoarthritis 09/13/2013  . Cervical pain 09/05/2013  . 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 Benign prostatic hypertrophy without urinary obstruction 04/04/2013  .  Restless leg 04/04/2013  . Diabetes mellitus, type 2 (HCC) 04/04/2013  . 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 Anxiety 12/20/2012  . DDD (degenerative disc disease), lumbosacral 12/20/2012  . 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 Varicose veins of both legs with edema 10/25/2012  . Acquired spondylolisthesis 08/23/2012  . Chronic pain 08/11/2012  . Excessive urination at night 08/11/2012  . RA (rheumatoid arthritis) (HCC)   . Chronic venous insufficiency   . COPD gold stage C.   . Obesity   . Hypertension   . Chronic allergic rhinitis    Past Medical History:  Diagnosis Date  . Atelectasis   . 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 Chronic allergic rhinitis   . Chronic venous insufficiency    superficial  . COPD (chronic obstructive pulmonary disease) (HCC)   . Cough   . Diabetes mellitus without complication (HCC)    not on medications since  surgery 10/2015   . Edema    lower extremities  . Elevated liver enzymes    d/t arava  . Family history of adverse reaction to anesthesia    pts mother had difficulty with anesthesia - pt not sure what difficulties were   . Hypertension    no longer on blood pressure medications since 10/25/2015 per patient   . Kidney stones    hx of   . Leg pain   . Liver enlargement    d/t arava  . Obesity   . Pallor   . Pneumonia    hx of times 3; pt states is current with pneumonia vaccine  . RA (rheumatoid arthritis) (HCC)     rheumatoid arthritis   . Sleep apnea    bipap  . SOBOE (shortness of breath on exertion)   . Superficial thrombophlebitis    subacute-on anticoagulation  . Urinary frequency   . Urinary tract bacterial infections    hx of   . Venous stasis    changes    Family History  Problem Relation Age of Onset  . Heart attack Father   . Esophageal cancer Mother   . Other Mother     Tachycardia  . Diabetes Brother     Past Surgical History:  Procedure Laterality Date    . HAND SURGERY  1965   right hand  . KNEE SURGERY     arthroscopic right and left knee  . LAMINECTOMY  1988   and fusion; L4, L5, S1  . LAMINECTOMY  2007   and fusion L1, L2, L3  . LAPAROSCOPIC GASTRIC SLEEVE RESECTION N/A 10/26/2015   Procedure: LAPAROSCOPIC GASTRIC SLEEVE RESECTION, UPPER ENDO;  Surgeon: Ovidio Kin, MD;  Location: [facility];  Service: General;  Laterality: N/A;  . OTHER SURGICAL HISTORY     polynidal cyst  . REPLACEMENT TOTAL KNEE  2009   left 2009; right 2014  . SPINAL CORD STIMULATOR IMPLANT     to control  back pain   . TOTAL HIP ARTHROPLASTY Right 08/08/2014   Procedure: RIGHT TOTAL HIP ARTHROPLASTY ANTERIOR APPROACH;  Surgeon: Kathryne Hitch, MD;  Location: [facility];  Service: Orthopedics;  Laterality: Right;  . TOTAL HIP ARTHROPLASTY Left 12/11/2015   Procedure: LEFT TOTAL HIP ARTHROPLASTY ANTERIOR APPROACH;  Surgeon: Kathryne Hitch, MD;  Location: [facility];  Service: Orthopedics;  Laterality: Left;  . UMBILICAL HERNIA REPAIR     Social History   Occupational History  . disabled      former Chief Financial Officer, office work    Social History Main Topics  . Smoking status: Former Smoker    Packs/day: 2.00    Years: 45.00    Types: Cigarettes    Quit date: 03/08/2007  . Smokeless tobacco: Never Used  . Alcohol use No     Comment: recovering alcoholic - none since 2012   . Drug use: No     Comment: hx of marijuana in teenage years  . Sexual activity: Not on file

## 2016-04-21 NOTE — Telephone Encounter (Signed)
Patient aware he needs to get the pain medication from the doctor he gets his pain medicine from

## 2016-04-21 NOTE — Telephone Encounter (Signed)
Norco 5/325mg  1-2 po q 4 prn pain #30 zero

## 2016-05-04 ENCOUNTER — Ambulatory Visit (INDEPENDENT_AMBULATORY_CARE_PROVIDER_SITE_OTHER): Payer: Medicare HMO | Admitting: Physician Assistant

## 2016-05-04 DIAGNOSIS — M25562 Pain in left knee: Secondary | ICD-10-CM | POA: Diagnosis not present

## 2016-05-04 DIAGNOSIS — G8929 Other chronic pain: Secondary | ICD-10-CM | POA: Diagnosis not present

## 2016-05-04 DIAGNOSIS — M25552 Pain in left hip: Secondary | ICD-10-CM

## 2016-05-04 NOTE — Addendum Note (Signed)
Addended by: Albertina Parr on: 05/04/2016 09:50 AM   Modules accepted: Orders

## 2016-05-04 NOTE — Progress Notes (Signed)
Office Visit Note   Patient: Anthony Friedman           Date of Birth: [birth year]           MRN: [id number] Visit Date: 05/04/2016              Requested by: Lizbeth Bark, NP [address] [place], [place] [address] PCP: Smothers, Cathleen Corti, NP   Assessment & Plan: Visit Diagnoses:  1. Pain of left hip joint   2. Chronic pain of left knee     Plan: We will obtain a bone scan to rule out loosening of the left total knee arthroplasty. He will continue to work on strengthening and stretching. He is in pain management . We spoke with him about long term narcotic use and adverse effects in regards to pain . He is unable to take NSAIDS and this definitely complicates managing his pain.   Follow-Up Instructions: Return in about 2 weeks (around 05/18/2016) for AFTER BONE SCAN.   Orders:  No orders of the defined types were placed in this encounter.  No orders of the defined types were placed in this encounter.     Procedures: No procedures performed   Clinical Data: No additional findings.   Subjective: Left knee and hip pain  HPI Mr. Gayden returns today noting that his left hip pain is worse since undergoing left hip trochanteric injection. He continues to have pain in the hip groin lateral aspect of the left hip. He has pain in left knee which she had replaced proximally 3 years ago. He is unable to go to exercise daily due to some days that he cannot trust the leg that it may give way. Denies any radicular symptoms down the leg. He does walk a walking stick. Review of Systems No fevers ,chills ,shortness of breath or chest pain  Objective: Vital Signs: There were no vitals taken for this visit.  Physical Exam Well-developed well-nourished overweight male in no acute distress. Ortho Exam Left hip he has good range of motion of the hip with internal/external rotation. Internal rotation causes groin pain. His tenderness over the left trochanteric region. Next  the left knee excellent range of motion of the knee. Tenderness along medial joint line. No instability valgus varus stressing. Surgical incisions well-healed no effusion no abnormal warmth.  Specialty Comments:  No specialty comments available.  Imaging: No results found.   PMFS History: Patient Active Problem List   Diagnosis Date Noted  . OSA (obstructive sleep apnea) 02/02/2016  . Osteoarthritis of left hip 12/11/2015  . Status post left hip replacement 12/11/2015  . Bradycardia 10/28/2015  . Morbid obesity (HCC) 10/26/2015  . Pre-op evaluation 04/16/2015  . Osteoarthritis of right hip 08/08/2014  . Status post total replacement of right hip 08/08/2014  . 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 Edema, peripheral 10/29/2013  . Insufficiency, vascular 10/29/2013  . Cervical osteoarthritis 09/13/2013  . Cervical pain 09/05/2013  . 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 Benign prostatic hypertrophy without urinary obstruction 04/04/2013  . Restless leg 04/04/2013  . Diabetes mellitus, type 2 (HCC) 04/04/2013  . 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 Anxiety 12/20/2012  . DDD (degenerative disc disease), lumbosacral 12/20/2012  . 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 Varicose veins of both legs with edema 10/25/2012  . Acquired spondylolisthesis 08/23/2012  . Chronic pain 08/11/2012  . Excessive urination at night 08/11/2012  . RA (rheumatoid arthritis) (HCC)   . Chronic venous insufficiency   .  COPD gold stage C.   . Obesity   . Hypertension   . Chronic allergic rhinitis    Past Medical History:  Diagnosis Date  . Atelectasis   . 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 Chronic allergic rhinitis   . Chronic venous insufficiency    superficial  . COPD (chronic obstructive pulmonary disease) (HCC)   . Cough   . Diabetes mellitus without complication (HCC)    not on medications since  surgery 10/2015   . Edema    lower extremities  .  Elevated liver enzymes    d/t arava  . Family history of adverse reaction to anesthesia    pts mother had difficulty with anesthesia - pt not sure what difficulties were   . Hypertension    no longer on blood pressure medications since 10/25/2015 per patient   . Kidney stones    hx of   . Leg pain   . Liver enlargement    d/t arava  . Obesity   . Pallor   . Pneumonia    hx of times 3; pt states is current with pneumonia vaccine  . RA (rheumatoid arthritis) (HCC)     rheumatoid arthritis   . Sleep apnea    bipap  . SOBOE (shortness of breath on exertion)   . Superficial thrombophlebitis    subacute-on anticoagulation  . Urinary frequency   . Urinary tract bacterial infections    hx of   . Venous stasis    changes    Family History  Problem Relation Age of Onset  . Heart attack Father   . Esophageal cancer Mother   . Other Mother     Tachycardia  . Diabetes Brother     Past Surgical History:  Procedure Laterality Date  . HAND SURGERY  1965   right hand  . KNEE SURGERY     arthroscopic right and left knee  . LAMINECTOMY  1988   and fusion; L4, L5, S1  . LAMINECTOMY  2007   and fusion L1, L2, L3  . LAPAROSCOPIC GASTRIC SLEEVE RESECTION N/A 10/26/2015   Procedure: LAPAROSCOPIC GASTRIC SLEEVE RESECTION, UPPER ENDO;  Surgeon: Ovidio Kin, MD;  Location: [facility];  Service: General;  Laterality: N/A;  . OTHER SURGICAL HISTORY     polynidal cyst  . REPLACEMENT TOTAL KNEE  2009   left 2009; right 2014  . SPINAL CORD STIMULATOR IMPLANT     to control back pain   . TOTAL HIP ARTHROPLASTY Right 08/08/2014   Procedure: RIGHT TOTAL HIP ARTHROPLASTY ANTERIOR APPROACH;  Surgeon: Kathryne Hitch, MD;  Location: [facility];  Service: Orthopedics;  Laterality: Right;  . TOTAL HIP ARTHROPLASTY Left 12/11/2015   Procedure: LEFT TOTAL HIP ARTHROPLASTY ANTERIOR APPROACH;  Surgeon: Kathryne Hitch, MD;  Location: [facility];  Service: Orthopedics;  Laterality: Left;  . UMBILICAL  HERNIA REPAIR     Social History   Occupational History  . disabled      former Chief Financial Officer, office work    Social History Main Topics  . Smoking status: Former Smoker    Packs/day: 2.00    Years: 45.00    Types: Cigarettes    Quit date: 03/08/2007  . Smokeless tobacco: Never Used  . Alcohol use No     Comment: recovering alcoholic - none since 2012   . Drug use: No     Comment: hx of marijuana in teenage years  .  Sexual activity: Not on file

## 2016-05-11 ENCOUNTER — Other Ambulatory Visit (INDEPENDENT_AMBULATORY_CARE_PROVIDER_SITE_OTHER): Payer: Self-pay | Admitting: *Deleted

## 2016-05-11 DIAGNOSIS — M25562 Pain in left knee: Principal | ICD-10-CM

## 2016-05-11 DIAGNOSIS — G8929 Other chronic pain: Secondary | ICD-10-CM

## 2016-05-13 ENCOUNTER — Telehealth (INDEPENDENT_AMBULATORY_CARE_PROVIDER_SITE_OTHER): Payer: Self-pay | Admitting: Orthopaedic Surgery

## 2016-05-13 NOTE — Telephone Encounter (Signed)
Can you help me out with this? 

## 2016-05-13 NOTE — Telephone Encounter (Signed)
Patient is calling to ask about follow up on bone scan, says he has not  heard anything about this since his last office visit with dr.blackman   C#: [phone]

## 2016-05-17 NOTE — Telephone Encounter (Signed)
Pending authorization from [facility]

## 2016-05-24 ENCOUNTER — Telehealth (INDEPENDENT_AMBULATORY_CARE_PROVIDER_SITE_OTHER): Payer: Self-pay | Admitting: Orthopaedic Surgery

## 2016-05-24 NOTE — Telephone Encounter (Signed)
Pt called and asked about a bone scan he's suppose to have. He said no one has called him about it.   [phone]

## 2016-05-24 NOTE — Telephone Encounter (Signed)
See below, can you help with this please  

## 2016-05-24 NOTE — Telephone Encounter (Signed)
appt has been scheduled for March 28 at 10am for  Injection and then again at 1p at [facility]. Left message for pt to return my call.

## 2016-05-25 NOTE — Telephone Encounter (Signed)
Pt aware of appt.

## 2016-06-01 ENCOUNTER — Encounter (HOSPITAL_COMMUNITY)
Admission: RE | Admit: 2016-06-01 | Discharge: 2016-06-01 | Disposition: A | Discharge status: Home / Self Care | Payer: Medicare HMO | Source: Ambulatory Visit | Attending: Physician Assistant | Admitting: Physician Assistant

## 2016-06-01 DIAGNOSIS — M25562 Pain in left knee: Secondary | ICD-10-CM | POA: Insufficient documentation

## 2016-06-01 DIAGNOSIS — G8929 Other chronic pain: Secondary | ICD-10-CM

## 2016-06-01 MED ORDER — TECHNETIUM TC 99M MEDRONATE IV KIT: 25.0000 | PACK | Freq: Once | INTRAVENOUS | Status: DC | PRN

## 2016-06-08 ENCOUNTER — Telehealth (INDEPENDENT_AMBULATORY_CARE_PROVIDER_SITE_OTHER): Payer: Self-pay | Admitting: *Deleted

## 2016-06-08 NOTE — Telephone Encounter (Signed)
Please advise 

## 2016-06-08 NOTE — Telephone Encounter (Signed)
Pt stated he had a bone scan done 3/28, asking for results.

## 2016-06-10 NOTE — Telephone Encounter (Signed)
Possible loose patella component but needs to have an appointment

## 2016-06-10 NOTE — Telephone Encounter (Signed)
Made appt for patient.  

## 2016-06-16 ENCOUNTER — Ambulatory Visit (INDEPENDENT_AMBULATORY_CARE_PROVIDER_SITE_OTHER): Payer: Medicare HMO | Admitting: Orthopaedic Surgery

## 2016-06-16 DIAGNOSIS — G8929 Other chronic pain: Secondary | ICD-10-CM

## 2016-06-16 DIAGNOSIS — Z96641 Presence of right artificial hip joint: Secondary | ICD-10-CM

## 2016-06-16 DIAGNOSIS — M25562 Pain in left knee: Secondary | ICD-10-CM

## 2016-06-16 DIAGNOSIS — Z96642 Presence of left artificial hip joint: Secondary | ICD-10-CM

## 2016-06-16 DIAGNOSIS — Z96652 Presence of left artificial knee joint: Secondary | ICD-10-CM

## 2016-06-16 NOTE — Progress Notes (Signed)
Mr. Wildey is following up after 3 phase bone scan to rule out prosthetic loosening of a painful left total knee replacement. His left knee was replaced he says somewhere in 2016 in the right knee was placed into thousand 8. We've replaced both of his hips with his left hip being done 6 months ago and his right hip done 22 months ago. He still walks with an assistive device. Some of this is due to his pain. He is also morbidly obese but he said gastric bypass surgery and is also a lot of weight. He still only participates in aquatic aerobics really likes to try strength training and doing something else to burn calories. The left knee is hurting him ever since surgery. This was done and [employer]. He even saw someone at [facility] orth[facility]ics for second opinion and both places say there is nothing they feel is going on with his knee. We sent for 3 phase bone scan was to rule out prosthetic loosening.  On examination of his left knee I feel that it is ligamentously lax. The range of motion is full but certainly with his weight fluctuations in having multiple joint replacements of his other joints is certainly not unheard of. I see no evidence infection. The three-phase bone scan showed moderate symmetric uptake around both knee prosthesis which they feel like his chronic remodeling but no evidence of loosening infection or fracture.  At this point using a work on continued weight loss and activity modification as well as strength training. He like to come back and see about 3 months. Mildly option for him as far as his left knee would be considering a polyliner exchange or we could upsize as Polly. I showed him a knee model explaining in detail what this involves I think this would help stabilize his knee more.

## 2016-08-17 ENCOUNTER — Ambulatory Visit (INDEPENDENT_AMBULATORY_CARE_PROVIDER_SITE_OTHER): Payer: Medicare HMO | Admitting: Orthopaedic Surgery

## 2016-09-05 ENCOUNTER — Ambulatory Visit (INDEPENDENT_AMBULATORY_CARE_PROVIDER_SITE_OTHER): Payer: Medicare HMO | Admitting: Orthopaedic Surgery

## 2016-09-05 DIAGNOSIS — M25562 Pain in left knee: Secondary | ICD-10-CM | POA: Diagnosis not present

## 2016-09-05 DIAGNOSIS — G8929 Other chronic pain: Secondary | ICD-10-CM | POA: Diagnosis not present

## 2016-09-05 NOTE — Progress Notes (Signed)
The patient is well-known to me. He is a history of bilateral hip replacements performed at different times. He is also had bilateral total knee arthroplasties done elsewhere. We've been concerned that left knee has some ligamentous laxity. He says that starting from the early years postop. We've had 3 phase bone scan that shows chronic remodeling of both his knees.  On examination he still walk with a walking stick. His left knee the knee feels like there is some play in it with a abnormal Lockman's exam. We had x-rays before showing a stable knee arthroplasty.  At this point given the laxity in his knee I'm recommending a polyethylene liner exchange to upsize poly-her some minimal give him more stability. I had a long and thorough discussion about this and we had other appointments talk about this as well. We went over knee model and his x-rays and explained in detail the risk and benefits of the surgery. I do feel to medically warranted this point given the instability is having with that knee. He would like to have this done sometime later in the year so he has her surgery scheduler's card to call. All questions were encouraged and answered.

## 2016-10-13 ENCOUNTER — Encounter (HOSPITAL_COMMUNITY): Payer: Self-pay

## 2016-10-13 ENCOUNTER — Other Ambulatory Visit (INDEPENDENT_AMBULATORY_CARE_PROVIDER_SITE_OTHER): Payer: Self-pay | Admitting: Orthopaedic Surgery

## 2016-10-13 DIAGNOSIS — M238X2 Other internal derangements of left knee: Secondary | ICD-10-CM

## 2016-10-13 NOTE — Patient Instructions (Addendum)
Anthony Friedman  10/13/2016   Your procedure is scheduled on: 10/28/2016    Report to [facility] Main  Entrance  Follow signs to Short Stay on first floor at  0515 AM  Call this number if you have problems the morning of surgery  [phone]   Remember: ONLY 1 PERSON MAY GO WITH YOU TO SHORT STAY TO GET  READY MORNING OF YOUR SURGERY.  Do not eat food or drink liquids :After Midnight.     Take these medicines the morning of surgery with A SIP OF WATER: Proair if needed and bring, Breo Ellipta if needed and bring, eye drops as usual , tylenol if needed, tamsulosin(flomax)                                You may not have any metal on your body including hair pins and              piercings  Do not wear jewelry, , lotions, powders or perfumes, deodorant                       Men may shave face and neck.   Do not bring valuables to the hospital. [facility] IS NOT             RESPONSIBLE   FOR VALUABLES.  Contacts, dentures or bridgework may not be worn into surgery.  Leave suitcase in the car. After surgery it may be brought to your room.              Please read over the following fact sheets you were given: _____________________________________________________________________             [facility] - Preparing for Surgery Before surgery, you can play an important role.  Because skin is not sterile, your skin needs to be as free of germs as possible.  You can reduce the number of germs on your skin by washing with CHG (chlorahexidine gluconate) soap before surgery.  CHG is an antiseptic cleaner which kills germs and bonds with the skin to continue killing germs even after washing. Please DO NOT use if you have an allergy to CHG or antibacterial soaps.  If your skin becomes reddened/irritated stop using the CHG and inform your nurse when you arrive at Short Stay. Do not shave (including legs and underarms) for at least 48 hours prior to the first CHG shower.  You  may shave your face/neck. Please follow these instructions carefully:  1.  Shower with CHG Soap the night before surgery and the  morning of Surgery.  2.  If you choose to wash your hair, wash your hair first as usual with your  normal  shampoo.  3.  After you shampoo, rinse your hair and body thoroughly to remove the  shampoo.                           4.  Use CHG as you would any other liquid soap.  You can apply chg directly  to the skin and wash                       Gently with a scrungie or clean washcloth.  5.  Apply the CHG Soap to your  body ONLY FROM THE NECK DOWN.   Do not use on face/ open                           Wound or open sores. Avoid contact with eyes, ears mouth and genitals (private parts).                       Wash face,  Genitals (private parts) with your normal soap.             6.  Wash thoroughly, paying special attention to the area where your surgery  will be performed.  7.  Thoroughly rinse your body with warm water from the neck down.  8.  DO NOT shower/wash with your normal soap after using and rinsing off  the CHG Soap.                9.  Pat yourself dry with a clean towel.            10.  Wear clean pajamas.            11.  Place clean sheets on your bed the night of your first shower and do not  sleep with pets. Day of Surgery : Do not apply any lotions/deodorants the morning of surgery.  Please wear clean clothes to the hospital/surgery center.  FAILURE TO FOLLOW THESE INSTRUCTIONS MAY RESULT IN THE CANCELLATION OF YOUR SURGERY PATIENT SIGNATURE_________________________________  NURSE SIGNATURE__________________________________  ________________________________________________________________________

## 2016-10-14 ENCOUNTER — Encounter (HOSPITAL_COMMUNITY)
Admission: RE | Admit: 2016-10-14 | Discharge: 2016-10-14 | Disposition: A | Discharge status: Home / Self Care | Payer: Medicare HMO | Source: Ambulatory Visit | Attending: Orthopaedic Surgery | Admitting: Orthopaedic Surgery

## 2016-10-14 ENCOUNTER — Encounter (INDEPENDENT_AMBULATORY_CARE_PROVIDER_SITE_OTHER): Payer: Self-pay

## 2016-10-14 ENCOUNTER — Encounter (HOSPITAL_COMMUNITY): Payer: Self-pay

## 2016-10-14 DIAGNOSIS — Z01818 Encounter for other preprocedural examination: Secondary | ICD-10-CM | POA: Insufficient documentation

## 2016-10-14 LAB — HEPATIC FUNCTION PANEL
ALT: 39 U/L (ref 17–63)
AST: 48 U/L — AB (ref 15–41)
Albumin: 3.9 g/dL (ref 3.5–5.0)
Alkaline Phosphatase: 86 U/L (ref 38–126)
BILIRUBIN DIRECT: 0.2 mg/dL (ref 0.1–0.5)
BILIRUBIN INDIRECT: 0.8 mg/dL (ref 0.3–0.9)
BILIRUBIN TOTAL: 1 mg/dL (ref 0.3–1.2)
Total Protein: 6.9 g/dL (ref 6.5–8.1)

## 2016-10-14 LAB — CBC
HEMATOCRIT: 40.4 % (ref 39.0–52.0)
HEMOGLOBIN: 15 g/dL (ref 13.0–17.0)
MCH: 35 pg — AB (ref 26.0–34.0)
MCHC: 37.1 g/dL — AB (ref 30.0–36.0)
MCV: 94.4 fL (ref 78.0–100.0)
Platelets: 185 10*3/uL (ref 150–400)
RBC: 4.28 MIL/uL (ref 4.22–5.81)
RDW: 15.5 % (ref 11.5–15.5)
WBC: 8.9 10*3/uL (ref 4.0–10.5)

## 2016-10-14 LAB — SURGICAL PCR SCREEN
MRSA, PCR: NEGATIVE
STAPHYLOCOCCUS AUREUS: NEGATIVE

## 2016-10-14 LAB — DIFFERENTIAL
BASOS PCT: 0 %
Basophils Absolute: 0 10*3/uL (ref 0.0–0.1)
Eosinophils Absolute: 0.4 10*3/uL (ref 0.0–0.7)
Eosinophils Relative: 4 %
LYMPHS ABS: 1.7 10*3/uL (ref 0.7–4.0)
LYMPHS PCT: 19 %
MONOS PCT: 12 %
Monocytes Absolute: 1.1 10*3/uL — ABNORMAL HIGH (ref 0.1–1.0)
NEUTROS ABS: 5.7 10*3/uL (ref 1.7–7.7)
NEUTROS PCT: 65 %

## 2016-10-14 LAB — BASIC METABOLIC PANEL
ANION GAP: 8 (ref 5–15)
BUN: 31 mg/dL — AB (ref 6–20)
CALCIUM: 8.7 mg/dL — AB (ref 8.9–10.3)
CO2: 28 mmol/L (ref 22–32)
Chloride: 101 mmol/L (ref 101–111)
Creatinine, Ser: 0.97 mg/dL (ref 0.61–1.24)
GFR calc Af Amer: >60 mL/min (ref >60)
GLUCOSE: 106 mg/dL — AB (ref 65–99)
POTASSIUM: 5.3 mmol/L — AB (ref 3.5–5.1)
Sodium: 137 mmol/L (ref 135–145)

## 2016-10-14 LAB — HEMOGLOBIN A1C
HEMOGLOBIN A1C: 5.5 % (ref 4.8–5.6)
MEAN PLASMA GLUCOSE: 111.15 mg/dL

## 2016-10-14 NOTE — Progress Notes (Signed)
BMP result routed via epic to Dr Magnus Ivan

## 2016-10-14 NOTE — Progress Notes (Addendum)
LOV cardiology Dr Sharol Roussel 04-27-16 epic   With an abnormal CT scan and multiple coronary artery risk factors plus symptoms of dyspnea on exertion we'll proceed with nuclear stress testing. Generally we would just observe but with the above-mentioned issues I think could be prudent for [place] to proceed with this. Unfortunately he is unable to walk due to orthopedic issues we'll proceed with a Lexiscan nuclear stress testing. He has had an echocardiogram which showed normal LV function. We'll have him follow back up in 4-6 weeks.Marland Kitchen   LEXISCAN STRESS TEST 05-23-16 on chart   "normal perfusion stress test with no evidence of ischemia or scar"  ECHO 10-28-15 epic   Aortic valve:   Structurally normal valve.   Cusp separation was normal.  Doppler:  Transvalvular velocity was within the normal range. There was no stenosis. There was no regurgitation.  EKG 04-27-16 epic   (reviewed by cardiologist Junagadhwalla in 04-27-16 OV note

## 2016-10-18 ENCOUNTER — Telehealth (INDEPENDENT_AMBULATORY_CARE_PROVIDER_SITE_OTHER): Payer: Self-pay | Admitting: Orthopaedic Surgery

## 2016-10-18 NOTE — Telephone Encounter (Signed)
Faxed to number provided

## 2016-10-18 NOTE — Telephone Encounter (Signed)
Dr. Dierdre Forth is requesting the lab results from last Friday to be faxed over to their office. Fax # [phone]

## 2016-10-28 ENCOUNTER — Ambulatory Visit (HOSPITAL_COMMUNITY): Admission: RE | Admit: 2016-10-28 | Payer: Medicare HMO | Source: Ambulatory Visit | Admitting: Orthopaedic Surgery

## 2016-10-28 ENCOUNTER — Encounter (HOSPITAL_COMMUNITY): Admission: RE | Payer: Self-pay | Source: Ambulatory Visit

## 2016-10-28 SURGERY — IRRIGATION AND DEBRIDEMENT KNEE WITH POLY EXCHANGE
Anesthesia: Choice | Laterality: Left

## 2016-11-02 ENCOUNTER — Ambulatory Visit (INDEPENDENT_AMBULATORY_CARE_PROVIDER_SITE_OTHER): Payer: Medicare HMO | Admitting: Orthopaedic Surgery

## 2016-11-02 ENCOUNTER — Encounter (HOSPITAL_COMMUNITY): Payer: Self-pay | Admitting: *Deleted

## 2016-11-02 ENCOUNTER — Other Ambulatory Visit (INDEPENDENT_AMBULATORY_CARE_PROVIDER_SITE_OTHER): Payer: Self-pay | Admitting: Orthopaedic Surgery

## 2016-11-02 DIAGNOSIS — S8012XA Contusion of left lower leg, initial encounter: Secondary | ICD-10-CM

## 2016-11-02 DIAGNOSIS — T84019A Broken internal joint prosthesis, unspecified site, initial encounter: Secondary | ICD-10-CM

## 2016-11-02 NOTE — Progress Notes (Signed)
Need orders for epic for surgery on 11/04/16.  Thanks.

## 2016-11-02 NOTE — Progress Notes (Signed)
The patient is here for for assessment of his left leg. We actually had him set up for a left knee polyethylene liner exchange due to poly-wear and the need to up size his polyliner due to laxity in his knee. His knee replacement was performed elsewhere years ago and since then he's lost a significant amount of weight with gastric bypass surgery. We have had that surgery set up and then he developed a contusion on his left lower leg and a resultant cellulitis from a hematoma. We had to cancel that surgery until he resolved the cellulitis. His primary care physician gave him a IV dose of antibiotic as well as has had him on oral antibiotics. He comes in today for assessment of the left leg hematoma.  On examination there is no cellulitis of his left lower leg. On the anterior medial distal tibia there is a well-circumscribed area that is fluctuant but not from infection appears to be more of a hematoma. I cleaned this area with Betadine and alcohol and was able to aspirate and decompress dark red blood from this area consistent with a hematoma. There is no evidence infection. There is no warmth and no cellulitis.  At this point I do feel, with proceeding with his left knee revision with a polyliner exchange. We have discussed this in detail with him before and again today and he understands the surgery and the risk and benefits that are involved. I felt cough with proceeding at this point aced on his exam today. All questions were encouraged and answered. We will see him back in 2 weeks postoperative but no x-rays of be needed.

## 2016-11-03 MED ORDER — DEXTROSE 5 % IV SOLN
3.0000 g | INTRAVENOUS | Status: AC
  Administered 2016-11-04: 3 g via INTRAVENOUS
  Filled 2016-11-03: qty 3

## 2016-11-04 ENCOUNTER — Inpatient Hospital Stay (HOSPITAL_COMMUNITY): Payer: Medicare HMO | Admitting: Registered Nurse

## 2016-11-04 ENCOUNTER — Encounter (HOSPITAL_COMMUNITY): Payer: Self-pay | Admitting: *Deleted

## 2016-11-04 ENCOUNTER — Inpatient Hospital Stay (HOSPITAL_COMMUNITY): Payer: Medicare HMO

## 2016-11-04 ENCOUNTER — Encounter (HOSPITAL_COMMUNITY)
Admission: RE | Disposition: A | Discharge status: Home Health | Payer: Self-pay | Source: Ambulatory Visit | Attending: Orthopaedic Surgery

## 2016-11-04 ENCOUNTER — Inpatient Hospital Stay (HOSPITAL_COMMUNITY)
Admission: RE | Admit: 2016-11-04 | Discharge: 2016-11-05 | DRG: 489 | Disposition: A | Discharge status: Home Health | Payer: Medicare HMO | Source: Ambulatory Visit | Attending: Orthopaedic Surgery | Admitting: Orthopaedic Surgery

## 2016-11-04 DIAGNOSIS — M069 Rheumatoid arthritis, unspecified: Secondary | ICD-10-CM | POA: Diagnosis present

## 2016-11-04 DIAGNOSIS — Z888 Allergy status to other drugs, medicaments and biological substances status: Secondary | ICD-10-CM

## 2016-11-04 DIAGNOSIS — G2581 Restless legs syndrome: Secondary | ICD-10-CM | POA: Diagnosis present

## 2016-11-04 DIAGNOSIS — I10 Essential (primary) hypertension: Secondary | ICD-10-CM | POA: Diagnosis present

## 2016-11-04 DIAGNOSIS — I872 Venous insufficiency (chronic) (peripheral): Secondary | ICD-10-CM | POA: Diagnosis present

## 2016-11-04 DIAGNOSIS — J449 Chronic obstructive pulmonary disease, unspecified: Secondary | ICD-10-CM | POA: Diagnosis present

## 2016-11-04 DIAGNOSIS — Z833 Family history of diabetes mellitus: Secondary | ICD-10-CM

## 2016-11-04 DIAGNOSIS — E669 Obesity, unspecified: Secondary | ICD-10-CM | POA: Diagnosis present

## 2016-11-04 DIAGNOSIS — T84068A Wear of articular bearing surface of other internal prosthetic joint, initial encounter: Secondary | ICD-10-CM

## 2016-11-04 DIAGNOSIS — G4733 Obstructive sleep apnea (adult) (pediatric): Secondary | ICD-10-CM | POA: Diagnosis present

## 2016-11-04 DIAGNOSIS — Z96643 Presence of artificial hip joint, bilateral: Secondary | ICD-10-CM | POA: Diagnosis present

## 2016-11-04 DIAGNOSIS — E119 Type 2 diabetes mellitus without complications: Secondary | ICD-10-CM | POA: Diagnosis present

## 2016-11-04 DIAGNOSIS — T84019A Broken internal joint prosthesis, unspecified site, initial encounter: Secondary | ICD-10-CM

## 2016-11-04 DIAGNOSIS — Z9181 History of falling: Secondary | ICD-10-CM | POA: Diagnosis not present

## 2016-11-04 DIAGNOSIS — Z96659 Presence of unspecified artificial knee joint: Secondary | ICD-10-CM

## 2016-11-04 DIAGNOSIS — Z96653 Presence of artificial knee joint, bilateral: Secondary | ICD-10-CM | POA: Diagnosis present

## 2016-11-04 DIAGNOSIS — T84498A Other mechanical complication of other internal orthopedic devices, implants and grafts, initial encounter: Secondary | ICD-10-CM | POA: Diagnosis present

## 2016-11-04 DIAGNOSIS — Z87891 Personal history of nicotine dependence: Secondary | ICD-10-CM

## 2016-11-04 DIAGNOSIS — Z9884 Bariatric surgery status: Secondary | ICD-10-CM | POA: Diagnosis not present

## 2016-11-04 DIAGNOSIS — Z882 Allergy status to sulfonamides status: Secondary | ICD-10-CM

## 2016-11-04 DIAGNOSIS — Z96652 Presence of left artificial knee joint: Secondary | ICD-10-CM | POA: Diagnosis not present

## 2016-11-04 HISTORY — PX: I & D KNEE WITH POLY EXCHANGE: SHX5024

## 2016-11-04 LAB — SAMPLE TO BLOOD BANK

## 2016-11-04 LAB — BASIC METABOLIC PANEL
Anion gap: 5 (ref 5–15)
BUN: 22 mg/dL — ABNORMAL HIGH (ref 6–20)
CALCIUM: 9.2 mg/dL (ref 8.9–10.3)
CO2: 31 mmol/L (ref 22–32)
Chloride: 102 mmol/L (ref 101–111)
Creatinine, Ser: 0.9 mg/dL (ref 0.61–1.24)
Glucose, Bld: 96 mg/dL (ref 65–99)
Potassium: 4.5 mmol/L (ref 3.5–5.1)
SODIUM: 138 mmol/L (ref 135–145)

## 2016-11-04 LAB — GLUCOSE, CAPILLARY: GLUCOSE-CAPILLARY: 119 mg/dL — AB (ref 65–99)

## 2016-11-04 SURGERY — IRRIGATION AND DEBRIDEMENT KNEE WITH POLY EXCHANGE
Anesthesia: General | Site: Knee | Laterality: Left

## 2016-11-04 MED ORDER — 0.9 % SODIUM CHLORIDE (POUR BTL) OPTIME
TOPICAL | Status: DC | PRN
  Administered 2016-11-04: 1000 mL

## 2016-11-04 MED ORDER — DEXAMETHASONE SODIUM PHOSPHATE 10 MG/ML IJ SOLN
INTRAMUSCULAR | Status: DC | PRN
  Administered 2016-11-04: 10 mg via INTRAVENOUS

## 2016-11-04 MED ORDER — PROPOFOL 10 MG/ML IV BOLUS
INTRAVENOUS | Status: AC
Start: 2016-11-04 — End: 2016-11-04
  Filled 2016-11-04: qty 20

## 2016-11-04 MED ORDER — METOCLOPRAMIDE HCL 5 MG/ML IJ SOLN: 5.0000 mg | Freq: Three times a day (TID) | INTRAMUSCULAR | Status: DC | PRN

## 2016-11-04 MED ORDER — PROPOFOL 10 MG/ML IV BOLUS
INTRAVENOUS | Status: DC | PRN
  Administered 2016-11-04: 170 mg via INTRAVENOUS

## 2016-11-04 MED ORDER — ADULT MULTIVITAMIN W/MINERALS CH
1.0000 | ORAL_TABLET | Freq: Two times a day (BID) | ORAL | Status: DC
  Administered 2016-11-04 – 2016-11-05 (×2): 1 via ORAL
  Filled 2016-11-04 (×2): qty 1

## 2016-11-04 MED ORDER — HYDROMORPHONE HCL-NACL 0.5-0.9 MG/ML-% IV SOSY
PREFILLED_SYRINGE | INTRAVENOUS | Status: AC
  Filled 2016-11-04: qty 1

## 2016-11-04 MED ORDER — METOCLOPRAMIDE HCL 5 MG PO TABS: 5.0000 mg | ORAL_TABLET | Freq: Three times a day (TID) | ORAL | Status: DC | PRN

## 2016-11-04 MED ORDER — CEFAZOLIN SODIUM-DEXTROSE 2-4 GM/100ML-% IV SOLN
2.0000 g | Freq: Four times a day (QID) | INTRAVENOUS | Status: AC
  Administered 2016-11-04 (×2): 2 g via INTRAVENOUS
  Filled 2016-11-04 (×2): qty 100

## 2016-11-04 MED ORDER — HYDROMORPHONE HCL-NACL 0.5-0.9 MG/ML-% IV SOSY
0.2500 mg | PREFILLED_SYRINGE | INTRAVENOUS | Status: DC | PRN
  Administered 2016-11-04 (×4): 0.5 mg via INTRAVENOUS

## 2016-11-04 MED ORDER — POLYVINYL ALCOHOL 1.4 % OP SOLN
1.0000 [drp] | OPHTHALMIC | Status: DC | PRN
  Filled 2016-11-04: qty 15

## 2016-11-04 MED ORDER — MIDAZOLAM HCL 2 MG/2ML IJ SOLN
2.0000 mg | Freq: Once | INTRAMUSCULAR | Status: AC
  Administered 2016-11-04: 2 mg via INTRAVENOUS

## 2016-11-04 MED ORDER — FENTANYL CITRATE (PF) 100 MCG/2ML IJ SOLN
INTRAMUSCULAR | Status: DC | PRN
  Administered 2016-11-04 (×2): 50 ug via INTRAVENOUS

## 2016-11-04 MED ORDER — DIPHENHYDRAMINE HCL 12.5 MG/5ML PO ELIX: 12.5000 mg | ORAL_SOLUTION | ORAL | Status: DC | PRN

## 2016-11-04 MED ORDER — ROPINIROLE HCL 1 MG PO TABS
1.0000 mg | ORAL_TABLET | Freq: Every evening | ORAL | Status: DC
  Administered 2016-11-04: 1 mg via ORAL
  Filled 2016-11-04: qty 1

## 2016-11-04 MED ORDER — FOLIC ACID 1 MG PO TABS
1.0000 mg | ORAL_TABLET | Freq: Every day | ORAL | Status: DC
  Administered 2016-11-04 – 2016-11-05 (×2): 1 mg via ORAL
  Filled 2016-11-04 (×2): qty 1

## 2016-11-04 MED ORDER — ONDANSETRON HCL 4 MG/2ML IJ SOLN: 4.0000 mg | Freq: Four times a day (QID) | INTRAMUSCULAR | Status: DC | PRN

## 2016-11-04 MED ORDER — ASPIRIN EC 325 MG PO TBEC
325.0000 mg | DELAYED_RELEASE_TABLET | Freq: Two times a day (BID) | ORAL | Status: DC
  Administered 2016-11-04 – 2016-11-05 (×2): 325 mg via ORAL
  Filled 2016-11-04 (×2): qty 1

## 2016-11-04 MED ORDER — MIDAZOLAM HCL 2 MG/2ML IJ SOLN
INTRAMUSCULAR | Status: AC
  Administered 2016-11-04: 2 mg via INTRAVENOUS
  Filled 2016-11-04: qty 2

## 2016-11-04 MED ORDER — MIDAZOLAM HCL 5 MG/5ML IJ SOLN
INTRAMUSCULAR | Status: DC | PRN
  Administered 2016-11-04: 2 mg via INTRAVENOUS

## 2016-11-04 MED ORDER — LIDOCAINE 2% (20 MG/ML) 5 ML SYRINGE
INTRAMUSCULAR | Status: DC | PRN
  Administered 2016-11-04: 100 mg via INTRAVENOUS

## 2016-11-04 MED ORDER — POLYETHYLENE GLYCOL 3350 17 G PO PACK: 17.0000 g | PACK | Freq: Every day | ORAL | Status: DC | PRN

## 2016-11-04 MED ORDER — SENSITIVE EYES SALINE SOLN: 1.0000 | Status: DC | PRN

## 2016-11-04 MED ORDER — ONDANSETRON HCL 4 MG/2ML IJ SOLN
INTRAMUSCULAR | Status: AC
  Filled 2016-11-04: qty 2

## 2016-11-04 MED ORDER — LIDOCAINE 2% (20 MG/ML) 5 ML SYRINGE
INTRAMUSCULAR | Status: AC
Start: 2016-11-04 — End: 2016-11-04
  Filled 2016-11-04: qty 5

## 2016-11-04 MED ORDER — FENTANYL CITRATE (PF) 100 MCG/2ML IJ SOLN
INTRAMUSCULAR | Status: AC
  Administered 2016-11-04: 50 ug via INTRAVENOUS
  Filled 2016-11-04: qty 2

## 2016-11-04 MED ORDER — ROPIVACAINE HCL 7.5 MG/ML IJ SOLN
INTRAMUSCULAR | Status: DC | PRN
  Administered 2016-11-04: 20 mL via PERINEURAL

## 2016-11-04 MED ORDER — ACETAMINOPHEN 325 MG PO TABS: 650.0000 mg | ORAL_TABLET | Freq: Four times a day (QID) | ORAL | Status: DC | PRN

## 2016-11-04 MED ORDER — PROMETHAZINE HCL 25 MG/ML IJ SOLN: 6.2500 mg | INTRAMUSCULAR | Status: DC | PRN

## 2016-11-04 MED ORDER — DEXAMETHASONE SODIUM PHOSPHATE 10 MG/ML IJ SOLN
INTRAMUSCULAR | Status: AC
Start: 2016-11-04 — End: 2016-11-04
  Filled 2016-11-04: qty 1

## 2016-11-04 MED ORDER — PHENOL 1.4 % MT LIQD: 1.0000 | OROMUCOSAL | Status: DC | PRN

## 2016-11-04 MED ORDER — FENTANYL CITRATE (PF) 100 MCG/2ML IJ SOLN
INTRAMUSCULAR | Status: AC
  Filled 2016-11-04: qty 2

## 2016-11-04 MED ORDER — ACETAMINOPHEN 650 MG RE SUPP: 650.0000 mg | Freq: Four times a day (QID) | RECTAL | Status: DC | PRN

## 2016-11-04 MED ORDER — METHOCARBAMOL 500 MG PO TABS
500.0000 mg | ORAL_TABLET | Freq: Four times a day (QID) | ORAL | Status: DC | PRN
  Administered 2016-11-04 – 2016-11-05 (×2): 500 mg via ORAL
  Filled 2016-11-04 (×2): qty 1

## 2016-11-04 MED ORDER — METHOCARBAMOL 1000 MG/10ML IJ SOLN
500.0000 mg | Freq: Four times a day (QID) | INTRAVENOUS | Status: DC | PRN
  Administered 2016-11-04: 500 mg via INTRAVENOUS
  Filled 2016-11-04: qty 550

## 2016-11-04 MED ORDER — TAMSULOSIN HCL 0.4 MG PO CAPS
0.4000 mg | ORAL_CAPSULE | Freq: Every morning | ORAL | Status: DC
  Administered 2016-11-05: 0.4 mg via ORAL
  Filled 2016-11-04: qty 1

## 2016-11-04 MED ORDER — OXYCODONE HCL 5 MG PO TABS
5.0000 mg | ORAL_TABLET | ORAL | Status: DC | PRN
  Administered 2016-11-04 (×2): 15 mg via ORAL
  Administered 2016-11-04: 17:00:00 10 mg via ORAL
  Administered 2016-11-05 (×3): 15 mg via ORAL
  Filled 2016-11-04 (×2): qty 3
  Filled 2016-11-04: qty 2
  Filled 2016-11-04 (×3): qty 3

## 2016-11-04 MED ORDER — ONDANSETRON HCL 4 MG/2ML IJ SOLN
INTRAMUSCULAR | Status: DC | PRN
  Administered 2016-11-04: 4 mg via INTRAVENOUS

## 2016-11-04 MED ORDER — SODIUM CHLORIDE 0.9 % IV SOLN
INTRAVENOUS | Status: DC
  Administered 2016-11-04: 15:00:00 via INTRAVENOUS

## 2016-11-04 MED ORDER — DOCUSATE SODIUM 100 MG PO CAPS
100.0000 mg | ORAL_CAPSULE | Freq: Two times a day (BID) | ORAL | Status: DC
  Administered 2016-11-04 – 2016-11-05 (×2): 100 mg via ORAL
  Filled 2016-11-04 (×2): qty 1

## 2016-11-04 MED ORDER — FLUTICASONE FUROATE-VILANTEROL 100-25 MCG/INH IN AEPB
1.0000 | INHALATION_SPRAY | Freq: Every day | RESPIRATORY_TRACT | Status: DC
  Administered 2016-11-05: 09:00:00 1 via RESPIRATORY_TRACT
  Filled 2016-11-04: qty 28

## 2016-11-04 MED ORDER — ALBUTEROL SULFATE HFA 108 (90 BASE) MCG/ACT IN AERS: 2.0000 | INHALATION_SPRAY | Freq: Four times a day (QID) | RESPIRATORY_TRACT | Status: DC | PRN

## 2016-11-04 MED ORDER — LACTATED RINGERS IV SOLN
INTRAVENOUS | Status: DC
  Administered 2016-11-04 (×2): via INTRAVENOUS

## 2016-11-04 MED ORDER — HYDROMORPHONE HCL-NACL 0.5-0.9 MG/ML-% IV SOSY
1.0000 mg | PREFILLED_SYRINGE | INTRAVENOUS | Status: DC | PRN
  Administered 2016-11-04 (×2): 1 mg via INTRAVENOUS
  Filled 2016-11-04 (×2): qty 2

## 2016-11-04 MED ORDER — MENTHOL 3 MG MT LOZG: 1.0000 | LOZENGE | OROMUCOSAL | Status: DC | PRN

## 2016-11-04 MED ORDER — ONDANSETRON HCL 4 MG PO TABS: 4.0000 mg | ORAL_TABLET | Freq: Four times a day (QID) | ORAL | Status: DC | PRN

## 2016-11-04 MED ORDER — ALBUTEROL SULFATE (2.5 MG/3ML) 0.083% IN NEBU: 2.5000 mg | INHALATION_SOLUTION | Freq: Four times a day (QID) | RESPIRATORY_TRACT | Status: DC | PRN

## 2016-11-04 MED ORDER — STERILE WATER FOR IRRIGATION IR SOLN
Status: DC | PRN
  Administered 2016-11-04: 2000 mL

## 2016-11-04 MED ORDER — MIDAZOLAM HCL 2 MG/2ML IJ SOLN
INTRAMUSCULAR | Status: AC
  Filled 2016-11-04: qty 2

## 2016-11-04 MED ORDER — ALUM & MAG HYDROXIDE-SIMETH 200-200-20 MG/5ML PO SUSP: 30.0000 mL | ORAL | Status: DC | PRN

## 2016-11-04 MED ORDER — FENTANYL CITRATE (PF) 100 MCG/2ML IJ SOLN
100.0000 ug | Freq: Once | INTRAMUSCULAR | Status: AC
  Administered 2016-11-04: 50 ug via INTRAVENOUS

## 2016-11-04 SURGICAL SUPPLY — 62 items
AGENT HMST KT MTR STRL THRMB (HEMOSTASIS)
BAG SPEC THK2 15X12 ZIP CLS (MISCELLANEOUS) ×1
BAG ZIPLOCK 12X15 (MISCELLANEOUS) ×3 IMPLANT
BANDAGE ACE 4X5 VEL STRL LF (GAUZE/BANDAGES/DRESSINGS) ×1 IMPLANT
BANDAGE ACE 6X5 VEL STRL LF (GAUZE/BANDAGES/DRESSINGS) ×3 IMPLANT
BANDAGE ESMARK 6X9 LF (GAUZE/BANDAGES/DRESSINGS) ×1 IMPLANT
BNDG CMPR 9X6 STRL LF SNTH (GAUZE/BANDAGES/DRESSINGS) ×1
BNDG ESMARK 6X9 LF (GAUZE/BANDAGES/DRESSINGS) ×3
CLOSURE WOUND 1/2 X4 (GAUZE/BANDAGES/DRESSINGS)
COVER SURGICAL LIGHT HANDLE (MISCELLANEOUS) ×3 IMPLANT
CUFF TOURN SGL QUICK 34 (TOURNIQUET CUFF) ×3
CUFF TRNQT CYL 34X4X40X1 (TOURNIQUET CUFF) ×1 IMPLANT
DECANTER SPIKE VIAL GLASS SM (MISCELLANEOUS) ×1 IMPLANT
DRAPE EXTREMITY T 121X128X90 (DRAPE) ×3 IMPLANT
DRAPE POUCH INSTRU U-SHP 10X18 (DRAPES) ×3 IMPLANT
DRAPE U-SHAPE 47X51 STRL (DRAPES) ×3 IMPLANT
DRSG ADAPTIC 3X8 NADH LF (GAUZE/BANDAGES/DRESSINGS) ×1 IMPLANT
DRSG PAD ABDOMINAL 8X10 ST (GAUZE/BANDAGES/DRESSINGS) ×2 IMPLANT
ELECT REM PT RETURN 15FT ADLT (MISCELLANEOUS) ×3 IMPLANT
EVACUATOR 1/8 PVC DRAIN (DRAIN) ×1 IMPLANT
EVACUATOR SILICONE 100CC (DRAIN) ×1 IMPLANT
FACESHIELD WRAPAROUND (MASK) ×12 IMPLANT
FACESHIELD WRAPAROUND OR TEAM (MASK) ×5 IMPLANT
GAUZE SPONGE 4X4 12PLY STRL (GAUZE/BANDAGES/DRESSINGS) ×4 IMPLANT
GAUZE XEROFORM 1X8 LF (GAUZE/BANDAGES/DRESSINGS) ×2 IMPLANT
GLOVE BIOGEL PI IND STRL 8 (GLOVE) ×1 IMPLANT
GLOVE BIOGEL PI INDICATOR 8 (GLOVE) ×2
GLOVE ECLIPSE 8.0 STRL XLNG CF (GLOVE) ×3 IMPLANT
GLOVE ORTHO TXT STRL SZ7.5 (GLOVE) ×6 IMPLANT
GLOVE SURG ORTHO 8.0 STRL STRW (GLOVE) ×6 IMPLANT
GOWN STRL REUS W/TWL XL LVL3 (GOWN DISPOSABLE) ×7 IMPLANT
HANDPIECE INTERPULSE COAX TIP (DISPOSABLE) ×3
IMMOBILIZER KNEE 20 (SOFTGOODS)
IMMOBILIZER KNEE 20 THIGH 36 (SOFTGOODS) IMPLANT
NDL SAFETY ECLIPSE 18X1.5 (NEEDLE) ×1 IMPLANT
NEEDLE HYPO 18GX1.5 SHARP (NEEDLE) ×3
NS IRRIG 1000ML POUR BTL (IV SOLUTION) ×6 IMPLANT
PACK TOTAL JOINT (CUSTOM PROCEDURE TRAY) ×3 IMPLANT
PAD ABD 8X10 STRL (GAUZE/BANDAGES/DRESSINGS) ×2 IMPLANT
PADDING CAST ABS 6INX4YD NS (CAST SUPPLIES) ×2
PADDING CAST ABS COTTON 6X4 NS (CAST SUPPLIES) IMPLANT
PADDING CAST COTTON 6X4 STRL (CAST SUPPLIES) ×4 IMPLANT
PLATE ROT INSERT 15MM SIZE 4 (Plate) ×2 IMPLANT
POSITIONER SURGICAL ARM (MISCELLANEOUS) ×3 IMPLANT
SET HNDPC FAN SPRY TIP SCT (DISPOSABLE) ×1 IMPLANT
SPONGE LAP 18X18 X RAY DECT (DISPOSABLE) ×1 IMPLANT
STRIP CLOSURE SKIN 1/2X4 (GAUZE/BANDAGES/DRESSINGS) ×2 IMPLANT
SUCTION FRAZIER HANDLE 12FR (TUBING)
SUCTION TUBE FRAZIER 12FR DISP (TUBING) ×1 IMPLANT
SURGIFLO W/THROMBIN 8M KIT (HEMOSTASIS) ×1 IMPLANT
SUT MNCRL AB 4-0 PS2 18 (SUTURE) ×3 IMPLANT
SUT VIC AB 1 CT1 36 (SUTURE) ×9 IMPLANT
SUT VIC AB 2-0 CT1 27 (SUTURE) ×9
SUT VIC AB 2-0 CT1 TAPERPNT 27 (SUTURE) ×3 IMPLANT
SWAB COLLECTION DEVICE MRSA (MISCELLANEOUS) ×1 IMPLANT
SWAB CULTURE ESWAB REG 1ML (MISCELLANEOUS) ×1 IMPLANT
SYR 50ML LL SCALE MARK (SYRINGE) ×1 IMPLANT
SYR CONTROL 10ML LL (SYRINGE) ×1 IMPLANT
TOWEL OR 17X26 10 PK STRL BLUE (TOWEL DISPOSABLE) ×9 IMPLANT
TRAY FOLEY W/METER SILVER 16FR (SET/KITS/TRAYS/PACK) ×1 IMPLANT
WATER STERILE IRR 1500ML POUR (IV SOLUTION) ×5 IMPLANT
WRAP KNEE MAXI GEL POST OP (GAUZE/BANDAGES/DRESSINGS) ×2 IMPLANT

## 2016-11-04 NOTE — Brief Op Note (Signed)
11/04/2016  12:21 PM  PATIENT:  Clemens Catholic  66 y.o. male  PRE-OPERATIVE DIAGNOSIS:  left total knee arthroplasty laxity  POST-OPERATIVE DIAGNOSIS:  left total knee arthroplasty laxity  PROCEDURE:  Procedure(s): LEFT KNEE POLY EXCHANGE (Left)  SURGEON:  Surgeon(s) and Role:    Kathryne Hitch, MD - Primary  PHYSICIAN ASSISTANT: Rexene Edison, PA-C  ANESTHESIA:   regional and general  EBL:  No intake/output data recorded.  COUNTS:  YES  TOURNIQUET:  * Missing tourniquet times found for documented tourniquets in log:  [id number] *  DICTATION: .Other Dictation: Dictation Number [id number]  PLAN OF CARE: Admit to inpatient   PATIENT DISPOSITION:  PACU - hemodynamically stable.   Delay start of Pharmacological VTE agent (>24hrs) due to surgical blood loss or risk of bleeding: no

## 2016-11-04 NOTE — Anesthesia Procedure Notes (Signed)
Anesthesia Procedure Image    

## 2016-11-04 NOTE — Anesthesia Procedure Notes (Signed)
Anesthesia Regional Block: Adductor canal block   Pre-Anesthetic Checklist: ,, timeout performed, Correct Patient, Correct Site, Correct Laterality, Correct Procedure, Correct Position, site marked, Risks and benefits discussed,  Surgical consent,  Pre-op evaluation,  At surgeon's request and post-op pain management  Laterality: Left  Prep: chloraprep       Needles:  Injection technique: Single-shot  Needle Type: Stimiplex     Needle Length: 9cm      Additional Needles:   Procedures: ultrasound guided,,,,,,,,  Narrative:  Start time: 11/04/2016 11:00 AM End time: 11/04/2016 11:08 AM Injection made incrementally with aspirations every 5 mL.  Performed by: Personally  Anesthesiologist: Neftaly Inzunza  Additional Notes: Patient tolerated the procedure well without complications

## 2016-11-04 NOTE — Progress Notes (Signed)
Assisted Dr. Rose with left, ultrasound guided, adductor canal block. Side rails up, monitors on throughout procedure. See vital signs in flow sheet. Tolerated Procedure well.  

## 2016-11-04 NOTE — Anesthesia Preprocedure Evaluation (Signed)
Anesthesia Evaluation  Patient identified by MRN, date of birth, ID band Patient awake    Reviewed: Allergy & Precautions, NPO status , Patient's Chart, lab work & pertinent test results  Airway Mallampati: II  TM Distance: <3 FB Neck ROM: Full    Dental no notable dental hx.    Pulmonary sleep apnea , COPD, former smoker,    Pulmonary exam normal breath sounds clear to auscultation       Cardiovascular hypertension, + Peripheral Vascular Disease  Normal cardiovascular exam Rhythm:Regular Rate:Normal  - Left ventricle: The cavity size was normal. Wall thickness was   increased in a pattern of moderate LVH. Systolic function was   normal. The estimated ejection fraction was in the range of 60%   to 65%. Wall motion was normal; there were no regional wall   motion abnormalities. Features are consistent with a pseudonormal   left ventricular filling pattern, with concomitant abnormal   relaxation and increased filling pressure (grade 2 diastolic   dysfunction).   Neuro/Psych negative neurological ROS  negative psych ROS   GI/Hepatic negative GI ROS, Neg liver ROS,   Endo/Other  diabetesMorbid obesity  Renal/GU negative Renal ROS  negative genitourinary   Musculoskeletal  (+) Arthritis , Rheumatoid disorders,    Abdominal   Peds negative pediatric ROS (+)  Hematology negative hematology ROS (+)   Anesthesia Other Findings   Reproductive/Obstetrics negative OB ROS                             Anesthesia Physical Anesthesia Plan  ASA: III  Anesthesia Plan: General   Post-op Pain Management:  Regional for Post-op pain   Induction: Intravenous  PONV Risk Score and Plan: 1 and Ondansetron and Dexamethasone  Airway Management Planned: LMA  Additional Equipment:   Intra-op Plan:   Post-operative Plan: Extubation in OR  Informed Consent: I have reviewed the patients History and  Physical, chart, labs and discussed the procedure including the risks, benefits and alternatives for the proposed anesthesia with the patient or authorized representative who has indicated his/her understanding and acceptance.   Dental advisory given  Plan Discussed with: CRNA and Surgeon  Anesthesia Plan Comments:         Anesthesia Quick Evaluation

## 2016-11-04 NOTE — H&P (Signed)
TOTAL KNEE REVISION ADMISSION H&P  Patient is being admitted for left revision total knee arthroplasty.  Subjective:  Chief Complaint:left knee pain.  HPI: Anthony Friedman, 66 y.o. male, has a history of pain and functional disability in the left knee(s) due to failed previous arthroplasty and patient has failed non-surgical conservative treatments for greater than 12 weeks to include NSAID's and/or analgesics, flexibility and strengthening excercises, use of assistive devices, weight reduction as appropriate and activity modification. The indications for the revision of the total knee arthroplasty are bearing surface wear leading to symptomatic synovitis and tibiofemoral instability. Onset of symptoms was gradual starting 2 years ago with gradually worsening course since that time.  Prior procedures on the left knee(s) include arthroplasty.  Patient currently rates pain in the left knee(s) at 9 out of 10 with activity. There is pain with passive range of motion, joint swelling and instability symptoms with walking.  Patient has evidence of normal appearing implants by imaging studies. This condition presents safety issues increasing the risk of falls.  There is no current active infection.  Patient Active Problem List   Diagnosis Date Noted  . 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 Leg hematoma, left, initial encounter 11/02/2016  . OSA (obstructive sleep apnea) 02/02/2016  . Osteoarthritis of left hip 12/11/2015  . Status post left hip replacement 12/11/2015  . Bradycardia 10/28/2015  . Morbid obesity (HCC) 10/26/2015  . Pre-op evaluation 04/16/2015  . Osteoarthritis of right hip 08/08/2014  . Status post total replacement of right hip 08/08/2014  . 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 Edema, peripheral 10/29/2013  . Insufficiency, vascular 10/29/2013  . Cervical osteoarthritis 09/13/2013  . Cervical pain 09/05/2013  .  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 Benign prostatic hypertrophy without urinary obstruction 04/04/2013  . Restless leg 04/04/2013  . Diabetes mellitus, type 2 (HCC) 04/04/2013  . 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 Anxiety 12/20/2012  . DDD (degenerative disc disease), lumbosacral 12/20/2012  . 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 Varicose veins of both legs with edema 10/25/2012  . Acquired spondylolisthesis 08/23/2012  . Chronic pain 08/11/2012  . Excessive urination at night 08/11/2012  . RA (rheumatoid arthritis) (HCC)   . Chronic venous insufficiency   . COPD gold stage C.   . Obesity   . Hypertension   . Chronic allergic rhinitis    Past Medical History:  Diagnosis Date  . Atelectasis   . 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 Chronic allergic rhinitis   . Chronic venous insufficiency    superficial  . COPD (chronic obstructive pulmonary disease) (HCC)   . Cough   . Diabetes mellitus without complication (HCC)    not on medications since  surgery 10/2015   . Edema    lower extremities  . Elevated liver enzymes    d/t arava  . Family history of adverse reaction to anesthesia    pts mother had difficulty with anesthesia - pt not sure what difficulties were   . Hypertension    no longer on blood pressure medications since 10/25/2015 per patient   . Kidney stones    hx of   . Leg pain   . Liver enlargement    d/t arava  . Obesity   . Pallor   . Pneumonia    hx of times 3; pt states is current with pneumonia vaccine  . RA (rheumatoid arthritis) (HCC)  rheumatoid arthritis   . Sleep apnea    say he uses a mouth guard that incremently increases airflow   . SOBOE (shortness of breath on exertion)   . Superficial thrombophlebitis    subacute-on anticoagulation  . Urinary frequency   . Urinary tract bacterial infections    hx of   . Venous stasis    changes    Past Surgical History:  Procedure Laterality  Date  . HAND SURGERY  1965   right hand  . KNEE SURGERY     arthroscopic right and left knee  . LAMINECTOMY  1988   and fusion; L4, L5, S1  . LAMINECTOMY  2007   and fusion L1, L2, L3  . LAPAROSCOPIC GASTRIC SLEEVE RESECTION N/A 10/26/2015   Procedure: LAPAROSCOPIC GASTRIC SLEEVE RESECTION, UPPER ENDO;  Surgeon: Ovidio Kin, MD;  Location: [facility];  Service: General;  Laterality: N/A;  . OTHER SURGICAL HISTORY     polynidal cyst  . REPLACEMENT TOTAL KNEE  2009   left 2009; right 2014  . SPINAL CORD STIMULATOR IMPLANT     to control back pain   . TOTAL HIP ARTHROPLASTY Right 08/08/2014   Procedure: RIGHT TOTAL HIP ARTHROPLASTY ANTERIOR APPROACH;  Surgeon: Kathryne Hitch, MD;  Location: [facility];  Service: Orthopedics;  Laterality: Right;  . TOTAL HIP ARTHROPLASTY Left 12/11/2015   Procedure: LEFT TOTAL HIP ARTHROPLASTY ANTERIOR APPROACH;  Surgeon: Kathryne Hitch, MD;  Location: [facility];  Service: Orthopedics;  Laterality: Left;  . UMBILICAL HERNIA REPAIR      No prescriptions prior to admission.   Allergies  Allergen Reactions  . Atenolol Other (See Comments)    Lowers heart rate  . Sulfa Antibiotics Other (See Comments)    Blood in urine Blood in urine    Social History  Substance Use Topics  . Smoking status: Former Smoker    Packs/day: 2.00    Years: 45.00    Types: Cigarettes    Quit date: 09/13/2016  . Smokeless tobacco: Never Used  . Alcohol use No     Comment: recovering alcoholic - none since 2012     Family History  Problem Relation Age of Onset  . Heart attack Father   . Esophageal cancer Mother   . Other Mother        Tachycardia  . Diabetes Brother       Review of Systems  Musculoskeletal: Positive for joint pain.  All other systems reviewed and are negative.    Objective:  Physical Exam  Constitutional: He is oriented to person, place, and time. He appears well-developed and well-nourished.  HENT:  Head: Normocephalic and atraumatic.   Eyes: Pupils are equal, round, and reactive to light. EOM are normal.  Neck: Normal range of motion. Neck supple.  Cardiovascular: Normal rate and regular rhythm.   Respiratory: Effort normal and breath sounds normal.  GI: Soft. Bowel sounds are normal.  Musculoskeletal:       Left knee: He exhibits swelling, LCL laxity and MCL laxity. Tenderness found.  Neurological: He is alert and oriented to person, place, and time.  Skin: Skin is warm and dry.    Vital signs in last 24 hours:    Labs:  Estimated body mass index is 41.59 kg/m as calculated from the following:   Height as of 10/14/16: 5\' 9"  (1.753 m).   Weight as of 10/14/16: 281 lb 9.6 oz (127.7 kg).  Imaging Review Plain radiographs demonstrate a lefttotal knee arthroplasty Assessment/Plan:  Instability left total knee replacement , left knee(s) with failed previous arthroplasty.   The patient history, physical examination, clinical judgment of the provider and imaging studies are consistent with a normal appearing left total kneeof the left knee(s), previous total knee arthroplasty. Poly-liner exchange is deemed medically necessary. The treatment options including medical management, injection therapy, arthroscopy and revision arthroplasty were discussed at length. The risks and benefits of revision total knee arthroplasty were presented and reviewed. The risks due to aseptic loosening, infection, stiffness, patella tracking problems, thromboembolic complications and other imponderables were discussed. The patient acknowledged the explanation, agreed to proceed with the plan and consent was signed. Patient is being admitted for inpatient treatment for surgery, pain control, PT, OT, prophylactic antibiotics, VTE prophylaxis, progressive ambulation and ADL's and discharge planning.The patient is planning to be discharged home with home health services

## 2016-11-04 NOTE — Transfer of Care (Signed)
Immediate Anesthesia Transfer of Care Note  Patient: Anthony Friedman  Procedure(s) Performed: Procedure(s): LEFT KNEE POLY EXCHANGE (Left)  Patient Location: PACU  Anesthesia Type:General  Level of Consciousness:  sedated, patient cooperative and responds to stimulation  Airway & Oxygen Therapy:Patient Spontanous Breathing and Patient connected to face mask oxgen  Post-op Assessment:  Report given to PACU RN and Post -op Vital signs reviewed and stable  Post vital signs:  Reviewed and stable  Last Vitals:  Vitals:   11/04/16 1106 11/04/16 1110  BP: 124/79 125/74  Pulse: 76 77  Resp: 10 13  Temp:    SpO2: 94% 94%    Complications: No apparent anesthesia complications

## 2016-11-04 NOTE — Anesthesia Procedure Notes (Signed)
Procedure Name: LMA Insertion Date/Time: 11/04/2016 11:30 AM Performed by: Jhonnie Garner Pre-anesthesia Checklist: Patient identified, Emergency Drugs available, Suction available and Patient being monitored Patient Re-evaluated:Patient Re-evaluated prior to induction Oxygen Delivery Method: Circle system utilized Preoxygenation: Pre-oxygenation with 100% oxygen Induction Type: IV induction Ventilation: Mask ventilation without difficulty LMA: LMA inserted LMA Size: 5.0 Number of attempts: 1 Placement Confirmation: positive ETCO2 and breath sounds checked- equal and bilateral Tube secured with: Tape Dental Injury: Teeth and Oropharynx as per pre-operative assessment

## 2016-11-04 NOTE — Anesthesia Postprocedure Evaluation (Signed)
Anesthesia Post Note  Patient: Alvah Lagrow  Procedure(s) Performed: Procedure(s) (LRB): LEFT KNEE POLY EXCHANGE (Left)     Patient location during evaluation: PACU Anesthesia Type: General Level of consciousness: awake and alert Pain management: pain level controlled Vital Signs Assessment: post-procedure vital signs reviewed and stable Respiratory status: spontaneous breathing, nonlabored ventilation, respiratory function stable and patient connected to nasal cannula oxygen Cardiovascular status: blood pressure returned to baseline and stable Postop Assessment: no signs of nausea or vomiting Anesthetic complications: no    Last Vitals:  Vitals:   11/04/16 1345 11/04/16 1400  BP: (!) 120/98 115/63  Pulse: 79 84  Resp: 12 11  Temp:    SpO2: 90% 97%    Last Pain:  Vitals:   11/04/16 1330  TempSrc:   PainSc: 5                  Andon Villard S

## 2016-11-05 MED ORDER — OXYCODONE HCL 5 MG PO TABS: 5.0000 mg | ORAL_TABLET | ORAL | 0 refills | Status: DC | PRN

## 2016-11-05 MED ORDER — ASPIRIN EC 81 MG PO TBEC: 81.0000 mg | DELAYED_RELEASE_TABLET | Freq: Two times a day (BID) | ORAL | 0 refills | Status: DC

## 2016-11-05 NOTE — Progress Notes (Signed)
Patient ID: Anthony Friedman, male   DOB: 02/12/1951, 66 y.o.   MRN: [id number]  Doing well overall.  Can be discharged to home today.  Left knee stable.

## 2016-11-05 NOTE — Discharge Summary (Signed)
Patient ID: Anthony Friedman MRN: [id number] DOB/AGE: 65-Jan-1952 65 y.o.  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 Atelectasis   . 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 Chronic allergic rhinitis   . Chronic venous insufficiency    superficial  . COPD (chronic obstructive pulmonary disease) (HCC)   . Cough   . Diabetes mellitus without complication (HCC)    not on medications since  surgery 10/2015   . Edema    lower extremities  . Elevated liver enzymes    d/t arava  . Family history of adverse reaction to anesthesia    pts mother had difficulty with anesthesia - pt not sure what difficulties were   . Hypertension    no longer on blood pressure medications since 10/25/2015 per patient   . Kidney stones    hx of   . Leg pain   . Liver enlargement    d/t arava  . Obesity   . Pallor   . Pneumonia    hx of times 3; pt states is current with pneumonia vaccine  . RA (rheumatoid arthritis) (HCC)     rheumatoid arthritis   . Sleep apnea    say he uses a mouth guard that incremently increases airflow   . SOBOE (shortness of breath on exertion)   . Superficial thrombophlebitis    subacute-on anticoagulation  . Urinary frequency   . Urinary tract bacterial infections    hx of   . Venous stasis    changes    Surgeries: Procedure(s): LEFT KNEE POLY EXCHANGE on 11/04/2016   Consultants:   Discharged Condition: Improved  Hospital Course: Anthony Friedman is an 66 y.o. male who was admitted 11/04/2016 for operative treatment ofPolyethylene liner wear following total knee arthroplasty requiring isolated polyethylene liner exchange (HCC). [name], daily activities,  and work/hobbies. After pre-op clearance the patient was taken to the operating room on 11/04/2016 and underwent  Procedure(s): LEFT KNEE POLY EXCHANGE.    Patient was given perioperative antibiotics: Anti-infectives    Start     Dose/Rate Route Frequency Ordered Stop   11/04/16 1700  ceFAZolin (ANCEF) IVPB 2g/100 mL premix     2 g 200 mL/hr over 30 Minutes Intravenous Every 6 hours 11/04/16 1509 11/04/16 2313   11/04/16 0600  ceFAZolin (ANCEF) 3 g in dextrose 5 % 50 mL IVPB     3 g 130 mL/hr over 30 Minutes Intravenous On call to O.R. 11/03/16 1228 11/04/16 1145       Patient was given sequential compression devices, early ambulation, and chemoprophylaxis to prevent DVT.  Patient benefited maximally from hospital stay and there were no complications.    Recent vital signs: Patient Vitals for the past 24 hrs:  BP Temp Temp src Pulse Resp SpO2  11/05/16 0920 - - - - - 97 %  11/05/16 0628 121/71 97.9 F (36.6 C) Oral (!) 52 16 98 %  11/05/16 0150 103/63 97.6 F (36.4 C) Oral (!) 48 15 97 %  11/04/16 2135 (!) 121/56 98.2 F (36.8 C) Oral 78 16 95 %  11/04/16 1804 121/69 97.7 F (36.5 C) Oral 93 18 98 %  11/04/16 1656 120/62 97.6 F (36.4 C) Oral 88 18 98 %  11/04/16 1608 115/64 97.8 F (36.6 C) Oral 71 16 100 %  11/04/16 1505 126/72 98.1 F (36.7 C) - 66 16 99 %  11/04/16 1445 128/74 - - 77 15 99 %  11/04/16 1430 (!) 105/48 - - 75 11 97 %  11/04/16 1415 113/73 - - 71 12 99 %  11/04/16 1400 115/63 - - 84 11 97 %  11/04/16 1345 (!) 120/98 - - 79 12 90 %  11/04/16 1330 (!) 126/93 - - 74 (!) 21 96 %  11/04/16 1315 126/84 - - 77 14 95 %  11/04/16 1300 118/78 - - 78 18 97 %  11/04/16 1245 98/83 - - 78 12 98 %  11/04/16 1236 138/85 97.7 F (36.5 C) - 91 15 99 %  11/04/16 1110 125/74 - - 77 13 94 %  11/04/16 1106 124/79 - - 76 10 94 %  11/04/16 1104 - - - 75 12 95 %  11/04/16 1103 - - - 74 13 95 %  11/04/16 1102 - - - 75 13 94 %  11/04/16 1101 137/79 - - 73 12 92 %  11/04/16  1100 - - - 77 11 95 %  11/04/16 1059 - - - 80 11 98 %  11/04/16 1058 - - - 63 14 98 %  11/04/16 1057 (!) 143/54 - - 81 13 98 %  11/04/16 1056 - - - 80 13 98 %  11/04/16 1055 - - - 73 18 98 %     Recent laboratory studies:  Recent Labs  11/04/16 0930  NA 138  K 4.5  CL 102  CO2 31  BUN 22*  CREATININE 0.90  GLUCOSE 96  CALCIUM 9.2     Discharge Medications:   Allergies as of 11/05/2016      Reactions   Atenolol Other (See Comments)   Lowers heart rate   Sulfa Antibiotics Other (See Comments)   Blood in urine Blood in urine      Medication List    TAKE these medications   acetaminophen 500 MG tablet Commonly known as:  TYLENOL Take 1,000 mg by mouth every 8 (eight) hours as needed for moderate pain or headache.   aspirin EC 81 MG tablet Take 1 tablet (81 mg total) by mouth 2 (two) times daily after a meal. What changed:  when to take this   B-12 500 MCG Subl Place 500 mcg under the tongue 2 (two) times a week.   vitamin B-12 500 MCG tablet Commonly known as:  CYANOCOBALAMIN Take 500 mcg by mouth 2 (two) times a week. Monday & Thursday   CALCIUM 600 + D PO Take 1 tablet by mouth 2 (two) times daily.   clotrimazole-betamethasone cream Commonly known as:  LOTRISONE Apply 1 application topically daily as needed (rash).   FLOMAX 0.4 MG Caps capsule Generic drug:  tamsulosin Take 0.4 mg by mouth every morning.   fluticasone furoate-vilanterol 100-25 MCG/INH Aepb Commonly known as:  BREO ELLIPTA Inhale 1 puff into the lungs daily.   folic acid 400 MCG tablet Commonly known as:  FOLVITE Take 800 mcg by mouth daily.   methotrexate 2.5 MG tablet Commonly known as:  RHEUMATREX Take 20 mg by mouth every Friday. Caution:Chemotherapy. Protect from light. Takes 8 pills on Fridays   multivitamin with minerals Tabs tablet Take 1 tablet by mouth 2 (two) times daily.   Oxycodone HCl 10 MG Tabs Take 10 mg by mouth every 8 (eight) hours as needed (pain). for  pain  What changed:  Another medication with the same name was changed. Make sure you understand how and when to take each.   oxyCODONE 5 MG immediate release tablet Commonly known as:  Oxy IR/ROXICODONE Take 1-3 tablets (5-15 mg total) by mouth every 4 (four) hours as needed for breakthrough pain. What changed:  when to take this   PROAIR HFA 108 (90 Base) MCG/ACT inhaler Generic drug:  albuterol Inhale 2 puffs into the lungs every 6 (six) hours as needed for wheezing.   rOPINIRole 1 MG tablet Commonly known as:  REQUIP Take 1-3 mg by mouth every evening.   SENSITIVE EYES SALINE Soln 1 each by Does not apply route as needed (dry eyes).            Discharge Care Instructions        Start     Ordered   11/05/16 0000  aspirin EC 81 MG tablet  2 times daily after meals     11/05/16 1030   11/05/16 0000  oxyCODONE (OXY IR/ROXICODONE) 5 MG immediate release tablet  Every 4 hours PRN     11/05/16 1030   11/05/16 0000  Discharge patient    Question Answer Comment  Discharge disposition 01-Home or Self Care   Discharge patient date 11/05/2016      11/05/16 1030      Diagnostic Studies: Dg Knee Left Port  Result Date: 11/04/2016 LEFT CLINICAL DATA: Left knee revision EXAM: PORTABLE LEFT KNEE - 1-2 VIEW COMPARISON:  08/06/2013 FINDINGS: Changes of revision of the left knee replacement. No hardware or bony complicating feature. Soft tissue and joint space gas. No acute bony abnormality. IMPRESSION: Left knee replacement.  No hardware complicating feature. Electronically Signed   By: Charlett Nose M.D.   On: 11/04/2016 14:46    Disposition: 06-Home-Health Care Svc  Discharge Instructions    Discharge patient    Complete by:  As directed    Discharge disposition:  01-Home or Self Care   Discharge patient date:  11/05/2016      Follow-up Information    Kathryne Hitch, MD. Schedule an appointment as soon as possible for a visit in 2 week(s).   Specialty:  Orthopedic  Surgery Contact information: [address] [place] [address] [phone]            Signed: Kathryne Hitch 11/05/2016, 10:30 AM

## 2016-11-05 NOTE — Progress Notes (Signed)
Pt to d/c home. [facility] set up for physical therapy. No DME needs. AVS reviewed and "My Chart" discussed with pt. Pt capable of verbalizing medications, dressing changes, signs and symptoms of infection, and follow-up appointments. Remains hemodynamically stable. No signs and symptoms of distress. Educated pt to return to ER in the case of SOB, dizziness, or chest pain.

## 2016-11-05 NOTE — Care Management Note (Signed)
Case Management Note  Patient Details  Name: Frantz Quattrone MRN: [id number] Date of Birth: 03/01/1951  Subjective/Objective:      Left Knee Poly Exchange              Action/Plan: Discharge Planning: Spoke to pt and wife at bedside. Offered choice for HH/list provided. Pt requested [facility] for HHPT. States he had [facility] in the past. Has RW and 3n1 bedside commode at home.   PCP Lizbeth Bark MD   Expected Discharge Date:  11/05/16               Expected Discharge Plan:  Home w Home Health Services  In-House Referral:  NA  Discharge planning Services  CM Consult  Post Acute Care Choice:  Home Health Choice offered to:  Patient  DME Arranged:  N/A DME Agency:  NA  HH Arranged:  PT HH Agency:  [facility]  Status of Service:  Completed, signed off  If discussed at Long Length of Stay Meetings, dates discussed:    Additional Comments:  Elliot Cousin, RN 11/05/2016, 11:37 AM

## 2016-11-05 NOTE — Discharge Instructions (Signed)

## 2016-11-05 NOTE — Evaluation (Signed)
Physical Therapy Evaluation Patient Details Name: Anthony Friedman MRN: [id number] DOB: 11-14-1950 Today's Date: 11/05/2016   History of Present Illness  Pt s/p I&D of L knee with poly liner exchange.  Pt wtih hx of RA, COPD, DM, bil THR, L TKR and spinal cord stimulator  Clinical Impression  Pt admitted as above and presenting with functional mobility limitations 2* decreased L LE strength/ROM and post op pain.  Pt mobilizing at sup level and planning dc to home this date with assist of family.    Follow Up Recommendations No PT follow up    Equipment Recommendations  None recommended by PT    Recommendations for Other Services       Precautions / Restrictions Precautions Precautions: Fall Restrictions Weight Bearing Restrictions: No Other Position/Activity Restrictions: WBAT      Mobility  Bed Mobility Overal bed mobility: Needs Assistance Bed Mobility: Supine to Sit     Supine to sit: Min assist     General bed mobility comments: cues for sequence with min assist for LE management  Transfers Overall transfer level: Needs assistance Equipment used: Rolling walker (2 wheeled) Transfers: Sit to/from Stand Sit to Stand: Min guard;Supervision         General transfer comment: cues for LE management and use of UEs to self assist  Ambulation/Gait Ambulation/Gait assistance: Min guard;Supervision Ambulation Distance (Feet): 120 Feet Assistive device: Rolling walker (2 wheeled) Gait Pattern/deviations: Step-to pattern;Step-through pattern;Decreased step length - right;Decreased step length - left;Shuffle;Trunk flexed Gait velocity: decr Gait velocity interpretation: Below normal speed for age/gender General Gait Details: cues for posture and position from RW  Stairs Stairs: Yes Stairs assistance: Min assist Stair Management: No rails;Step to pattern;Backwards;Forwards;With walker Number of Stairs: 2 General stair comments: single step twice with RW - fwd and bkwd -  pt self cueing for sequence  Wheelchair Mobility    Modified Rankin (Stroke Patients Only)       Balance Overall balance assessment: No apparent balance deficits (not formally assessed)                                           Pertinent Vitals/Pain Pain Assessment: 0-10 Pain Score: 7  Pain Location: L knee Pain Descriptors / Indicators: Aching;Sore Pain Intervention(s): Limited activity within patient's tolerance;Monitored during session;Premedicated before session;Ice applied    Home Living Family/patient expects to be discharged to:: Private residence Living Arrangements: Spouse/significant other Available Help at Discharge: Family Type of Home: House Home Access: Stairs to enter Entrance Stairs-Rails: None Entrance Stairs-Number of Steps: 1 Home Layout: One level Home Equipment: Environmental consultant - 2 wheels;Bedside commode;Cane - single point      Prior Function Level of Independence: Independent with assistive device(s)         Comments: used cane     Hand Dominance        Extremity/Trunk Assessment   Upper Extremity Assessment Upper Extremity Assessment: Overall WFL for tasks assessed    Lower Extremity Assessment Lower Extremity Assessment: LLE deficits/detail LLE Deficits / Details: 2/5 quads with AAROM at knee -5 - 60       Communication   Communication: No difficulties  Cognition Arousal/Alertness: Awake/alert Behavior During Therapy: WFL for tasks assessed/performed Overall Cognitive Status: Within Functional Limits for tasks assessed  General Comments      Exercises Total Joint Exercises Ankle Circles/Pumps: AROM;Both;15 reps;Supine Quad Sets: AROM;Both;15 reps;Supine Heel Slides: AAROM;Right;15 reps;Supine Straight Leg Raises: AAROM;Right;10 reps;Supine   Assessment/Plan    PT Assessment Patent does not need any further PT services  PT Problem List         PT  Treatment Interventions      PT Goals (Current goals can be found in the Care Plan section)  Acute Rehab PT Goals Patient Stated Goal: Regain IND and walk sans AD PT Goal Formulation: All assessment and education complete, DC therapy Time For Goal Achievement: 11/05/16    Frequency     Barriers to discharge        Co-evaluation               AM-PAC PT "6 Clicks" Daily Activity  Outcome Measure Difficulty turning over in bed (including adjusting bedclothes, sheets and blankets)?: A Lot Difficulty moving from lying on back to sitting on the side of the bed? : A Lot Difficulty sitting down on and standing up from a chair with arms (e.g., wheelchair, bedside commode, etc,.)?: A Little Help needed moving to and from a bed to chair (including a wheelchair)?: A Little Help needed walking in hospital room?: A Little Help needed climbing 3-5 steps with a railing? : A Little 6 Click Score: 16    End of Session Equipment Utilized During Treatment: Gait belt Activity Tolerance: Patient tolerated treatment well Patient left: in chair;with call bell/phone within reach;with family/visitor present Nurse Communication: Mobility status PT Visit Diagnosis: Difficulty in walking, not elsewhere classified (R26.2)    Time: 1020-1105 PT Time Calculation (min) (ACUTE ONLY): 45 min   Charges:     PT Treatments $Gait Training: 8-22 mins $Therapeutic Exercise: 8-22 mins   PT G Codes:        Pg [phone]   Anthony Friedman 11/05/2016, 1:49 PM

## 2016-11-07 NOTE — Op Note (Signed)
NAME:  Anthony Friedman, Anthony Friedman NO.:  [id number]  MEDICAL RECORD NO.:  [id number]  LOCATION:                                 FACILITY:  PHYSICIAN:  Vanita Panda. Magnus Ivan, M.D.DATE OF BIRTH:  DATE OF PROCEDURE:  11/04/2016 DATE OF DISCHARGE:                              OPERATIVE REPORT   PREOPERATIVE DIAGNOSIS:  Left unstable total knee arthroplasty.  POSTOPERATIVE DIAGNOSIS:  Left unstable total knee arthroplasty.  PROCEDURE:  Poly liner exchange, left total knee arthroplasty with upsizing the poly.  IMPLANTS:  DePuy rotating platform polyethylene liner exchanging from a size 12.5 to now a 15-mm polyethylene insert thickness.  SURGEON:  Vanita Panda. Magnus Ivan, M.D.  ASSISTANT:  Richardean Canal, PA-C.  ANESTHESIA: 1. Left lower extremity adductor canal block. 2. General.  BLOOD LOSS:  Less than 100 mL.  TOURNIQUET TIME:  Less than 20 minutes.  ANTIBIOTICS:  3 g of IV Ancef.  COMPLICATIONS:  None.  INDICATIONS:  Anthony Friedman is a 66 year old gentleman, well known to me. At one time, he was morbidly obese and has had bilateral knee replacements and now bilateral hip replacements, but in between, he has had gastric bypass surgery.  He has lost abundant amount of weight.  All of his joints have done well, but his left knee has felt unstable to him.  On examination, the knee now hyperextended and does have some ligamentous laxity.  The x-ray showed no evidence of loosening and I was able to obtain an operative note showing that the poly liner thickness was 12.5 mm.  Based on his exam, I felt there was no evidence of infection and seen no osteolysis, but I do feel that upsizing his poly will give him more stable knee.  The risks and benefits were explained in detail and he did wish to proceed.  PROCEDURE DESCRIPTION:  After informed consent was obtained, appropriate left knee was marked.  Anesthesia obtained an adductor canal block in the holding room.  He was  brought to the operating room and placed supine on the operating table.  General anesthesia was then obtained.  I was able to easily assess his knee clinically and you could see that he hyperextends with that knee and there was definitely a positive drawer sign.  His left knee and ankle were prepped and draped with DuraPrep and sterile drapes including a sterile stockinette.  Time-out was called and he was identified as correct patient and correct left knee.  We then used an Esmarch to wrap out the leg and tourniquet was inflated to 300 mm of pressure.  I then went through his old incision over the patella and carried this proximally and distally.  We dissected down the knee joint, carried out a medial parapatellar arthrotomy and did not find any significant effusion and no evidence of infection at all.  The implant stem cells were well seated.  I removed the 12.5-mm polyethylene liner easily and then, we used the trial 15 liner for a size 4 left knee. With 15 liner in, he had no instability that we could tell with a negative drawer sign.  His knee had full extension without hyperextension with good  range of motion.  We removed the trial poly and irrigated the knee with normal saline solution.  Again, I assessed the implants and found that to be stable.  We then placed the real rotating platform size 15 mm thickness knee for a size 4 tibial tray, and reduced this to the knee again and he was definitely stable.  We were pleased with the range of motion as well.  We then irrigated the knee again, let the tourniquet down.  Hemostasis was obtained with electrocautery.  We closed the arthrotomy with interrupted #1 Vicryl suture followed by 0 Vicryl in the deep tissue, 2-0 Vicryl in the subcutaneous tissue, interrupted staples on the skin.  Xeroform and well-padded sterile dressing were applied.  He was awakened, extubated and taken to the recovery room in stable condition.  All final counts  were correct. There were no complications noted.  Of note, Richardean Canal, PA-C assisted in the entire case, his assistance was crucial for facilitating all aspects of this case.     Vanita Panda. Magnus Ivan, M.D.     CYB/MEDQ  D:  11/04/2016  T:  11/04/2016  Job:  [id number]

## 2016-11-08 ENCOUNTER — Telehealth (INDEPENDENT_AMBULATORY_CARE_PROVIDER_SITE_OTHER): Payer: Self-pay | Admitting: Orthopaedic Surgery

## 2016-11-08 NOTE — Telephone Encounter (Signed)
He is full weightbearing as tolerated on his left operative knee. Therapy should work on range of motion of his knee as well as strengthening. He should take at 325 mg aspirin twice daily for just until he comes see me in follow-up. He is good for continued home health therapy.

## 2016-11-08 NOTE — Telephone Encounter (Signed)
Verbal order given aswell as message below from CB

## 2016-11-08 NOTE — Telephone Encounter (Signed)
Anthony Friedman (PT) with [facility] called needing verbal orders for HH (PT) 3 wk 3. She advised patient can go to outpatient therapy after that. Anthony Friedman also needed clarification on which dosage of aspirin patient should be taking  81 mg or 325 mg and weight bearing status. The number to contact Anthony Friedman is [phone].

## 2016-11-08 NOTE — Telephone Encounter (Signed)
See below

## 2016-11-10 ENCOUNTER — Inpatient Hospital Stay (INDEPENDENT_AMBULATORY_CARE_PROVIDER_SITE_OTHER): Payer: Medicare HMO | Admitting: Orthopaedic Surgery

## 2016-11-17 ENCOUNTER — Ambulatory Visit (INDEPENDENT_AMBULATORY_CARE_PROVIDER_SITE_OTHER): Payer: Medicare HMO | Admitting: Orthopaedic Surgery

## 2016-11-17 DIAGNOSIS — T84068D Wear of articular bearing surface of other internal prosthetic joint, subsequent encounter: Secondary | ICD-10-CM

## 2016-11-17 DIAGNOSIS — Z96659 Presence of unspecified artificial knee joint: Secondary | ICD-10-CM

## 2016-11-17 NOTE — Progress Notes (Signed)
Anthony Friedman is 2 weeks tomorrow status post a polyethylene liner exchange of an unstable left total knee. He is similarly loss a significant amount of weight and he had his knee replacement done [facility] and we felt that he needed upsizing the poly-. The knee was unstable varus valgus stressing. We have without sizes poly-he says he already feels better overall. His pain is been only mild.  Examination of the left knee shows his calf is soft. He's got good range of motion that knee. Staples are removed and Steri-Strips applied. The knee feels ligamentously stable.  At this point he'll continue to increase activities as comfort allows. I'll see him back in 4 weeks to see how is doing overall. After his home therapy is done he does not need outpatient therapy. He says he does not need any pain medicine today.

## 2016-12-13 ENCOUNTER — Other Ambulatory Visit: Payer: Self-pay | Admitting: Pulmonary Disease

## 2016-12-13 MED ORDER — FLUTICASONE FUROATE-VILANTEROL 100-25 MCG/INH IN AEPB: 1.0000 | INHALATION_SPRAY | Freq: Every day | RESPIRATORY_TRACT | 2 refills | Status: DC

## 2016-12-14 ENCOUNTER — Ambulatory Visit (INDEPENDENT_AMBULATORY_CARE_PROVIDER_SITE_OTHER): Payer: Medicare HMO | Admitting: Physician Assistant

## 2016-12-14 DIAGNOSIS — T84068S Wear of articular bearing surface of other internal prosthetic joint, sequela: Secondary | ICD-10-CM

## 2016-12-14 DIAGNOSIS — Z96659 Presence of unspecified artificial knee joint: Secondary | ICD-10-CM

## 2016-12-14 NOTE — Progress Notes (Signed)
[name] returns today follow-up of his left knee status post poly-exchange 11/04/2016. He is overall doing well. He is having with the results. He does report has had some pain in his groin feels he may pulled some buddies back in the gym.  Physical exam left knee full extension flexion to 110s to 115 easily. No instability valgus varus stressing. No effusion abnormal warmth erythema surgical incisions well-healed. Tenderness left trochanteric region. Good range of motion of the hip without pain.  Impression : 40 days status post left total knee poly-exchange  Plan: Discussed with him some IT band stretching exercises. Did offer him a cortisone injection lateral hip he defers. He'll continue work on strengthening and range of motion of the left knee. Follow up with [place] in 6 months sooner if there is any questions or concerns.

## 2017-02-15 ENCOUNTER — Ambulatory Visit (INDEPENDENT_AMBULATORY_CARE_PROVIDER_SITE_OTHER): Payer: Medicare HMO | Admitting: Orthopaedic Surgery

## 2017-02-15 ENCOUNTER — Ambulatory Visit (INDEPENDENT_AMBULATORY_CARE_PROVIDER_SITE_OTHER): Payer: Medicare HMO

## 2017-02-15 ENCOUNTER — Encounter (INDEPENDENT_AMBULATORY_CARE_PROVIDER_SITE_OTHER): Payer: Self-pay | Admitting: Orthopaedic Surgery

## 2017-02-15 DIAGNOSIS — M25562 Pain in left knee: Secondary | ICD-10-CM

## 2017-02-15 DIAGNOSIS — Z96659 Presence of unspecified artificial knee joint: Secondary | ICD-10-CM

## 2017-02-15 DIAGNOSIS — T84068D Wear of articular bearing surface of other internal prosthetic joint, subsequent encounter: Secondary | ICD-10-CM | POA: Diagnosis not present

## 2017-02-15 MED ORDER — HYDROCODONE-ACETAMINOPHEN 10-325 MG PO TABS: 1.0000 | ORAL_TABLET | Freq: Four times a day (QID) | ORAL | 0 refills | Status: DC | PRN

## 2017-02-15 NOTE — Progress Notes (Signed)
The patient comes in today for evaluation of his left knee.  Over 3 months ago performed a left knee polyethylene liner exchange with upsizing his polyliner.  He been doing well until he fell on the treadmill just the other day.  He feels like the knee gave out on him but he said it was more of a fall before he gave out.  On examination of his left knee the knee feels grossly stable but he has pain over the lateral collateral ligaments is got good range of motion overall.  There is no significant swelling.  2 views of his left knee are obtained and show no acute findings with a well-seated implant left side with no comp occasions.  At this point I want to try a hinged knee brace and some pain medication with activity modification.  We will see him back in 2 months to see how he is doing overall.  No x-rays will be needed.  All questions and concerns were answered and addressed.

## 2017-04-30 ENCOUNTER — Emergency Department (HOSPITAL_BASED_OUTPATIENT_CLINIC_OR_DEPARTMENT_OTHER): Payer: Medicare HMO

## 2017-04-30 ENCOUNTER — Encounter (HOSPITAL_BASED_OUTPATIENT_CLINIC_OR_DEPARTMENT_OTHER): Payer: Self-pay | Admitting: Emergency Medicine

## 2017-04-30 ENCOUNTER — Other Ambulatory Visit: Payer: Self-pay

## 2017-04-30 ENCOUNTER — Inpatient Hospital Stay (HOSPITAL_BASED_OUTPATIENT_CLINIC_OR_DEPARTMENT_OTHER)
Admission: EM | Admit: 2017-04-30 | Discharge: 2017-05-04 | DRG: 501 | Disposition: A | Discharge status: Home / Self Care | Payer: Medicare HMO | Attending: Internal Medicine | Admitting: Internal Medicine

## 2017-04-30 DIAGNOSIS — M11232 Other chondrocalcinosis, left wrist: Secondary | ICD-10-CM | POA: Diagnosis present

## 2017-04-30 DIAGNOSIS — I10 Essential (primary) hypertension: Secondary | ICD-10-CM | POA: Diagnosis present

## 2017-04-30 DIAGNOSIS — M009 Pyogenic arthritis, unspecified: Principal | ICD-10-CM | POA: Diagnosis present

## 2017-04-30 DIAGNOSIS — M25532 Pain in left wrist: Secondary | ICD-10-CM | POA: Diagnosis not present

## 2017-04-30 DIAGNOSIS — E118 Type 2 diabetes mellitus with unspecified complications: Secondary | ICD-10-CM

## 2017-04-30 DIAGNOSIS — J449 Chronic obstructive pulmonary disease, unspecified: Secondary | ICD-10-CM | POA: Diagnosis present

## 2017-04-30 DIAGNOSIS — L03114 Cellulitis of left upper limb: Secondary | ICD-10-CM | POA: Diagnosis present

## 2017-04-30 DIAGNOSIS — E119 Type 2 diabetes mellitus without complications: Secondary | ICD-10-CM

## 2017-04-30 DIAGNOSIS — Z96643 Presence of artificial hip joint, bilateral: Secondary | ICD-10-CM | POA: Diagnosis present

## 2017-04-30 DIAGNOSIS — Z87891 Personal history of nicotine dependence: Secondary | ICD-10-CM

## 2017-04-30 DIAGNOSIS — E1142 Type 2 diabetes mellitus with diabetic polyneuropathy: Secondary | ICD-10-CM | POA: Diagnosis present

## 2017-04-30 DIAGNOSIS — G8929 Other chronic pain: Secondary | ICD-10-CM | POA: Diagnosis present

## 2017-04-30 DIAGNOSIS — Z96653 Presence of artificial knee joint, bilateral: Secondary | ICD-10-CM | POA: Diagnosis present

## 2017-04-30 DIAGNOSIS — G4733 Obstructive sleep apnea (adult) (pediatric): Secondary | ICD-10-CM | POA: Diagnosis present

## 2017-04-30 DIAGNOSIS — Z6841 Body Mass Index (BMI) 40.0 and over, adult: Secondary | ICD-10-CM

## 2017-04-30 DIAGNOSIS — M069 Rheumatoid arthritis, unspecified: Secondary | ICD-10-CM | POA: Diagnosis present

## 2017-04-30 LAB — BASIC METABOLIC PANEL
Anion gap: 10 (ref 5–15)
BUN: 18 mg/dL (ref 6–20)
CALCIUM: 8.9 mg/dL (ref 8.9–10.3)
CO2: 24 mmol/L (ref 22–32)
CREATININE: 0.8 mg/dL (ref 0.61–1.24)
Chloride: 98 mmol/L — ABNORMAL LOW (ref 101–111)
Glucose, Bld: 115 mg/dL — ABNORMAL HIGH (ref 65–99)
Potassium: 4.2 mmol/L (ref 3.5–5.1)
Sodium: 132 mmol/L — ABNORMAL LOW (ref 135–145)

## 2017-04-30 LAB — CBC WITH DIFFERENTIAL/PLATELET
BASOS ABS: 0 10*3/uL (ref 0.0–0.1)
BASOS PCT: 0 %
EOS ABS: 0.1 10*3/uL (ref 0.0–0.7)
Eosinophils Relative: 1 %
HCT: 37.5 % — ABNORMAL LOW (ref 39.0–52.0)
Hemoglobin: 12.8 g/dL — ABNORMAL LOW (ref 13.0–17.0)
Lymphocytes Relative: 10 %
Lymphs Abs: 1.1 10*3/uL (ref 0.7–4.0)
MCH: 32.4 pg (ref 26.0–34.0)
MCHC: 34.1 g/dL (ref 30.0–36.0)
MCV: 94.9 fL (ref 78.0–100.0)
MONO ABS: 1.3 10*3/uL — AB (ref 0.1–1.0)
Monocytes Relative: 12 %
Neutro Abs: 8.8 10*3/uL — ABNORMAL HIGH (ref 1.7–7.7)
Neutrophils Relative %: 77 %
PLATELETS: 237 10*3/uL (ref 150–400)
RBC: 3.95 MIL/uL — ABNORMAL LOW (ref 4.22–5.81)
RDW: 15.1 % (ref 11.5–15.5)
WBC: 11.3 10*3/uL — ABNORMAL HIGH (ref 4.0–10.5)

## 2017-04-30 LAB — MAGNESIUM: MAGNESIUM: 1.8 mg/dL (ref 1.7–2.4)

## 2017-04-30 MED ORDER — MORPHINE SULFATE (PF) 4 MG/ML IV SOLN
4.0000 mg | Freq: Once | INTRAVENOUS | Status: AC
  Administered 2017-04-30: 4 mg via INTRAVENOUS

## 2017-04-30 MED ORDER — VANCOMYCIN HCL 10 G IV SOLR
1250.0000 mg | Freq: Two times a day (BID) | INTRAVENOUS | Status: DC
  Filled 2017-04-30: qty 1250

## 2017-04-30 MED ORDER — KETOROLAC TROMETHAMINE 30 MG/ML IJ SOLN
15.0000 mg | Freq: Once | INTRAMUSCULAR | Status: AC
  Administered 2017-04-30: 15 mg via INTRAVENOUS
  Filled 2017-04-30: qty 1

## 2017-04-30 MED ORDER — SODIUM CHLORIDE 0.9 % IV SOLN
2000.0000 mg | Freq: Once | INTRAVENOUS | Status: AC
  Administered 2017-04-30: 2000 mg via INTRAVENOUS
  Filled 2017-04-30: qty 2000

## 2017-04-30 MED ORDER — MORPHINE SULFATE (PF) 4 MG/ML IV SOLN
4.0000 mg | Freq: Once | INTRAVENOUS | Status: DC
  Filled 2017-04-30: qty 1

## 2017-04-30 MED ORDER — OXYCODONE-ACETAMINOPHEN 5-325 MG PO TABS
1.0000 | ORAL_TABLET | ORAL | Status: AC | PRN
  Administered 2017-04-30 (×2): 1 via ORAL
  Filled 2017-04-30 (×3): qty 1

## 2017-04-30 MED ORDER — VANCOMYCIN HCL IN DEXTROSE 1-5 GM/200ML-% IV SOLN
INTRAVENOUS | Status: AC
Start: 2017-04-30 — End: 2017-05-01
  Filled 2017-04-30: qty 400

## 2017-04-30 MED ORDER — VANCOMYCIN HCL 10 G IV SOLR
2500.0000 mg | Freq: Once | INTRAVENOUS | Status: DC
  Filled 2017-04-30: qty 2500

## 2017-04-30 NOTE — Progress Notes (Signed)
Hand Surgery Note  Received call regarding this patient. Acute onset of wrist and forearm pain according to PA Leaphart. Denies fever. 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 Patient has history of RA. Reviewed photo under media tab. Would recommend symptom control with pain medication, NSAIDs, Ice and elevation. Would also start PO antibiotics in case component of cellulitis. May follow up this week for further evaluation if no improvement.  Roby Lofts, MD [facility] [phone] (phone)

## 2017-04-30 NOTE — ED Notes (Signed)
While giving pt pain medication, he was noted to have a HR fluctuating from the 40's -90's. Placed on monitor. EKG requested. PA notified.

## 2017-04-30 NOTE — Progress Notes (Addendum)
Pharmacy Antibiotic Note  Anthony Friedman is a 67 y.o. male admitted on 04/30/2017 with cellulitis.  Pharmacy has been consulted for vancomycin dosing. -WBC= 11,3, afeb, CrCl > 120  Plan: -vancomycin 2000mg  IV x1 -Per RN- No plans for admission -no vancomycin maintenance doses needed    Height: 5\' 9"  (175.3 cm) Weight: 275 lb (124.7 kg) IBW/kg (Calculated) : 70.7  Temp (24hrs), Avg:99.7 F (37.6 C), Min:99.7 F (37.6 C), Max:99.7 F (37.6 C)  Recent Labs  Lab 04/30/17 2100  WBC 11.3*  CREATININE 0.80    Estimated Creatinine Clearance: 120.2 mL/min (by C-G formula based on SCr of 0.8 mg/dL).    Allergies  Allergen Reactions  . Atenolol Other (See Comments)    Lowers heart rate  . Sulfa Antibiotics Other (See Comments)    Blood in urine Blood in urine      Thank you for allowing pharmacy to be a part of this patient's care.  , Pharm D 04/30/2017 9:50 PM

## 2017-04-30 NOTE — ED Notes (Addendum)
Alert, NAD, calm, interactive, resps e/u, speaking in clear complete sentences, no dyspnea noted, skin W&D.   C/o L A&P wrist pain, (P>A), pinpoints from fingers to below elbow,redness and swelling noted, mentions RA takes methotrexate, also mentions fall ~ 1 week again b/r at 0230am, relates sx to RA or fall, pt of Dr. Ceasar Mons (rheum) in [place]. (denies: sob, NVD, fever, dizziness or visual changes). Family at [facility].  Some relief with percocet, but rates pain 10/10. CMS intact. ROM limited.

## 2017-04-30 NOTE — ED Triage Notes (Addendum)
Pt reports he woke up with L lower arm/wrist pain and swelling. States pain has gotten worse as the day goes on. Denies injury. Pt sent from UC for further eval.

## 2017-04-30 NOTE — ED Notes (Signed)
SPO2 ranging from 89-92%. Pt placed on O2@2L . RT notified.

## 2017-04-30 NOTE — ED Provider Notes (Signed)
[facility] EMERGENCY DEPARTMENT Provider Note   CSN: [id number] Arrival date & time: 04/30/17  1723     History   Chief Complaint Chief Complaint  Patient presents with  . Arm Pain    HPI Anthony Friedman is a 67 y.o. male.   HPI This 67 year old Caucasian male past medical history significant for hypertension, rheumatoid arthritis currently on methotrexate, COPD, diabetes that presents to the emergency department today with complaints of left hand pain.  Patient states that yesterday he started having some pain in his left wrist.  States that he woke up this morning with left wrist pain and swelling with associated erythema and warmth.  Patient states that the pain is gotten worse and continues to increase up his arm.  He denies any known injury to the area.  Patient was seen in urgent care and sent to the ED for further evaluation.  Patient reports taking methotrexate for rheumatoid arthritis.  Denies think this is a RA flare.  Patient does take chronic Percocet.  States he did take a Percocet with only little relief.  Movement and palpation make the pain worse.  Nothing makes the pain better.  Denies any history of septic arthritis.  Denies any associated fevers.  Denies any associated paresthesias or weakness.  Past Medical History:  Diagnosis Date  . Atelectasis   . 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 Chronic allergic rhinitis   . Chronic venous insufficiency    superficial  . COPD (chronic obstructive pulmonary disease) (HCC)   . Cough   . Diabetes mellitus without complication (HCC)    not on medications since  surgery 10/2015   . Edema    lower extremities  . Elevated liver enzymes    d/t arava  . Family history of adverse reaction to anesthesia    pts mother had difficulty with anesthesia - pt not sure what difficulties were   . Hypertension    no longer on blood pressure medications since 10/25/2015 per patient   .  Kidney stones    hx of   . Leg pain   . Liver enlargement    d/t arava  . Obesity   . Pallor   . Pneumonia    hx of times 3; pt states is current with pneumonia vaccine  . RA (rheumatoid arthritis) (HCC)     rheumatoid arthritis   . Sleep apnea    say he uses a mouth guard that incremently increases airflow   . SOBOE (shortness of breath on exertion)   . Superficial thrombophlebitis    subacute-on anticoagulation  . Urinary frequency   . Urinary tract bacterial infections    hx of   . Venous stasis    changes    Patient Active Problem List   Diagnosis Date Noted  . 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 Leg hematoma, left, initial encounter 11/02/2016  . OSA (obstructive sleep apnea) 02/02/2016  . Osteoarthritis of left hip 12/11/2015  . Status post left hip replacement 12/11/2015  . Bradycardia 10/28/2015  . Morbid obesity (HCC) 10/26/2015  . Pre-op evaluation 04/16/2015  . Osteoarthritis of right hip 08/08/2014  . Status post total replacement of right hip 08/08/2014  . 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 Edema, peripheral 10/29/2013  . Insufficiency, vascular 10/29/2013  . Cervical osteoarthritis 09/13/2013  . Cervical pain 09/05/2013  . 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 Benign  prostatic hypertrophy without urinary obstruction 04/04/2013  . Restless leg 04/04/2013  . Diabetes mellitus, type 2 (HCC) 04/04/2013  . 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 Anxiety 12/20/2012  . DDD (degenerative disc disease), lumbosacral 12/20/2012  . 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 Varicose veins of both legs with edema 10/25/2012  . Acquired spondylolisthesis 08/23/2012  . Chronic pain 08/11/2012  . Excessive urination at night 08/11/2012  . RA (rheumatoid arthritis) (HCC)   . Chronic venous insufficiency   . COPD gold stage C.   . Obesity   . Hypertension   . Chronic allergic rhinitis      Past Surgical History:  Procedure Laterality Date  . HAND SURGERY  1965   right hand  . I&D KNEE WITH POLY EXCHANGE Left 11/04/2016   Procedure: LEFT KNEE POLY EXCHANGE;  Surgeon: Kathryne Hitch, MD;  Location: [facility];  Service: Orthopedics;  Laterality: Left;  . KNEE SURGERY     arthroscopic right and left knee  . LAMINECTOMY  1988   and fusion; L4, L5, S1  . LAMINECTOMY  2007   and fusion L1, L2, L3  . LAPAROSCOPIC GASTRIC SLEEVE RESECTION N/A 10/26/2015   Procedure: LAPAROSCOPIC GASTRIC SLEEVE RESECTION, UPPER ENDO;  Surgeon: Ovidio Kin, MD;  Location: [facility];  Service: General;  Laterality: N/A;  . OTHER SURGICAL HISTORY     polynidal cyst  . REPLACEMENT TOTAL KNEE  2009   left 2009; right 2014  . SPINAL CORD STIMULATOR IMPLANT     to control back pain   . TOTAL HIP ARTHROPLASTY Right 08/08/2014   Procedure: RIGHT TOTAL HIP ARTHROPLASTY ANTERIOR APPROACH;  Surgeon: Kathryne Hitch, MD;  Location: [facility];  Service: Orthopedics;  Laterality: Right;  . TOTAL HIP ARTHROPLASTY Left 12/11/2015   Procedure: LEFT TOTAL HIP ARTHROPLASTY ANTERIOR APPROACH;  Surgeon: Kathryne Hitch, MD;  Location: [facility];  Service: Orthopedics;  Laterality: Left;  . UMBILICAL HERNIA REPAIR         Home Medications    Prior to Admission medications   Medication Sig Start Date End Date Taking? Authorizing Provider  acetaminophen (TYLENOL) 500 MG tablet Take 1,000 mg by mouth every 8 (eight) hours as needed for moderate pain or headache.     [provider]  albuterol (PROAIR HFA) 108 (90 BASE) MCG/ACT inhaler Inhale 2 puffs into the lungs every 6 (six) hours as needed for wheezing.    [provider]  aspirin EC 81 MG tablet Take 1 tablet (81 mg total) by mouth 2 (two) times daily after a meal. 11/05/16   Kathryne Hitch, MD  Calcium Carb-Cholecalciferol (CALCIUM 600 + D PO) Take 1 tablet by mouth 2 (two) times daily.     [provider]   clotrimazole-betamethasone (LOTRISONE) cream Apply 1 application topically daily as needed (rash).  11/10/15   [provider]  Cyanocobalamin (B-12) 500 MCG SUBL Place 500 mcg under the tongue 2 (two) times a week.     [provider]  fluticasone furoate-vilanterol (BREO ELLIPTA) 100-25 MCG/INH AEPB Inhale 1 puff into the lungs daily. 12/13/16   Lupita Leash, MD  folic acid (FOLVITE) 400 MCG tablet Take 800 mcg by mouth daily.    [provider]  gabapentin (NEURONTIN) 300 MG capsule  12/27/16   [provider]  HYDROcodone-acetaminophen (NORCO) 10-325 MG tablet Take 1 tablet by mouth every 6 (six) hours as needed. 02/15/17   Kathryne Hitch, MD  methotrexate (RHEUMATREX) 2.5 MG  tablet Take 20 mg by mouth every Friday. Caution:Chemotherapy. Protect from light. Takes 8 pills on Fridays     [provider]  Multiple Vitamin (MULTIVITAMIN WITH MINERALS) TABS tablet Take 1 tablet by mouth 2 (two) times daily.    [provider]  oxyCODONE (OXY IR/ROXICODONE) 5 MG immediate release tablet Take 1-3 tablets (5-15 mg total) by mouth every 4 (four) hours as needed for breakthrough pain. Patient not taking: Reported on 02/15/2017 11/05/16   Kathryne Hitch, MD  Oxycodone HCl 10 MG TABS Take 10 mg by mouth every 8 (eight) hours as needed (pain). for pain 09/28/16   [provider]  rOPINIRole (REQUIP) 1 MG tablet Take 1-3 mg by mouth every evening.     [provider]  Soft Lens Products (SENSITIVE EYES SALINE) SOLN 1 each by Does not apply route as needed (dry eyes).    [provider]  Tamsulosin HCl (FLOMAX) 0.4 MG CAPS Take 0.4 mg by mouth every morning.     [provider]  vitamin B-12 (CYANOCOBALAMIN) 500 MCG tablet Take 500 mcg by mouth 2 (two) times a week. Monday & Thursday    [provider]    Family History Family History  Problem Relation Age of Onset  . Heart attack Father    . Esophageal cancer Mother   . Other Mother        Tachycardia  . Diabetes Brother     Social History Social History   Tobacco Use  . Smoking status: Former Smoker    Packs/day: 2.00    Years: 45.00    Pack years: 90.00    Types: Cigarettes    Last attempt to quit: 09/13/2016    Years since quitting: 0.6  . Smokeless tobacco: Never Used  Substance Use Topics  . Alcohol use: No    Alcohol/week: 0.0 oz    Comment: recovering alcoholic - none since 2012   . Drug use: No    Comment: hx of marijuana in teenage years     Allergies   Atenolol and Sulfa antibiotics   Review of Systems Review of Systems  Constitutional: Positive for chills. Negative for fever.  Respiratory: Negative for shortness of breath.   Cardiovascular: Negative for chest pain.  Gastrointestinal: Negative for nausea and vomiting.  Musculoskeletal: Positive for arthralgias, joint swelling and myalgias.  Skin: Positive for color change.  Neurological: Negative for weakness and numbness.     Physical Exam Updated Vital Signs BP 122/72   Pulse (!) 51   Temp 99.7 F (37.6 C) (Oral)   Resp 11   Ht 5\' 9"  (1.753 m)   Wt 124.7 kg (275 lb)   SpO2 97%   BMI 40.61 kg/m   Physical Exam  Constitutional: He is oriented to person, place, and time. He appears well-developed and well-nourished.  Non-toxic appearance. No distress.  HENT:  Head: Normocephalic and atraumatic.  Mouth/Throat: Oropharynx is clear and moist.  Eyes: Conjunctivae are normal. Pupils are equal, round, and reactive to light. Right eye exhibits no discharge. Left eye exhibits no discharge.  Neck: Normal range of motion. Neck supple.  Cardiovascular: Normal rate, regular rhythm, normal heart sounds and intact distal pulses. Exam reveals no gallop and no friction rub.  No murmur heard. Pulmonary/Chest: Effort normal and breath sounds normal. No respiratory distress. He exhibits no tenderness.  Abdominal: Soft. Bowel sounds are normal.  He exhibits no distension. There is no tenderness. There is no rebound and no guarding.  Musculoskeletal: Normal range of motion. He exhibits no tenderness.  Patient with erythema, warmth and redness to the dorsum of the left hand.  Significantly tender with any range of motion of the left wrist.  He is got no erythema or tenderness over the flexor sheath.  Brisk cap refill.  Radial pulses 2+ bilaterally.  Full range of motion left elbow without pain.  Palpation makes the pain worse.  Lymphadenopathy:    He has no cervical adenopathy.  Neurological: He is alert and oriented to person, place, and time.  Skin: Skin is warm and dry. Capillary refill takes less than 2 seconds. No rash noted.  Psychiatric: His behavior is normal. Judgment and thought content normal.  Nursing note and vitals reviewed.      ED Treatments / Results  Labs (all labs ordered are listed, but only abnormal results are displayed) Labs Reviewed - No data to display  EKG  EKG Interpretation  Date/Time:  Sunday April 30 2017 20:50:47 EST Ventricular Rate:  113 PR Interval:    QRS Duration: 79 QT Interval:  354 QTC Calculation: 382 R Axis:   15 Text Interpretation:  Sinus tachycardia Ventricular bigeminy Low voltage, precordial leads Confirmed by Meridee Score [phone]) on 04/30/2017 9:02:27 PM       Radiology Dg Wrist Complete Left  Result Date: 04/30/2017 CLINICAL DATA:  C/o severe pain w/mild swelling in Lt wrist/hand today getting progressively worse, no injury. Hx RA EXAM: LEFT WRIST - COMPLETE 3+ VIEW COMPARISON:  None. FINDINGS: No fracture or dislocation. There is widening of the scapholunate interval to 5 mm consistent with disruption of the scapholunate ligament. There is narrowing of the radial aspect of the radiocarpal joint. Remaining joints are normally spaced and aligned. No erosions. There is diffuse surrounding soft tissue swelling. Arterial vascular calcifications are noted anteriorly.  IMPRESSION: 1. No fracture or dislocation. 2. 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 3. Diffuse surrounding soft tissue swelling. There may be active synovitis. No erosions visualized. Electronically Signed   By: Amie Portland M.D.   On: 04/30/2017 18:38    Procedures Procedures (including critical care time)  Medications Ordered in ED Medications  vancomycin (VANCOCIN) 1-5 GM/200ML-% IVPB (not administered)  oxyCODONE-acetaminophen (PERCOCET/ROXICET) 5-325 MG per tablet 1 tablet (1 tablet Oral Given 04/30/17 2153)  morphine 4 MG/ML injection 4 mg (4 mg Intravenous Given 04/30/17 2042)  ketorolac (TORADOL) 30 MG/ML injection 15 mg (15 mg Intravenous Given 04/30/17 2155)  vancomycin (VANCOCIN) 2,000 mg in sodium chloride 0.9 % 500 mL IVPB (0 mg Intravenous Stopped 05/01/17 0052)     Initial Impression / Assessment and Plan / ED Course  I have reviewed the triage vital signs and the nursing notes.  Pertinent labs & imaging results that were available during my care of the patient were reviewed by me and considered in my medical decision making (see chart for details).  Clinical Course as of May 01 26  Wynelle Link Apr 30, 2017  3270 67 year old male with history of rheumatoid arthritis with acute onset of wrist pain.  Pain is severe is associate with any kind of movement at all.  On exam he is got some swelling is got some fullness and he is unable to have any range of motion without severe pain.  When he got some IV pain medicine here developed more bigeminy pattern and he is still having some ectopy although less than prior.  He is ultimately asymptomatic from that and we will check some  labs to make sure his electrolytes are okay.  And surgery felt he would be okay to wait until tomorrow where they can take a look and probably tap his joint.  [MB]    Clinical Course User Index [MB] Terrilee Files, MD    Patient presents to the ED for evaluation of  acute onset left wrist pain.  Patient with history of RA and currently taking methotrexate.  Patient denies any associated fevers.  Does have low-grade temperature in the ED of 99.7.  No tachycardia noted.  Patient is not hypotensive.  She with pain with range of motion of the left wrist.  Neurovascularly intact.  No pain with range of motion the left elbow.    Xray reveals: IMPRESSION: 1. No fracture or dislocation. 2. 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 3. Diffuse surrounding soft tissue swelling. There may be active synovitis. No erosions visualized  Lab work obtained shows a mild leukocytosis of 11,000.  Electrolytes are reassuring.  Normal mag kidney function is normal.  Patient did have an episode while he was receiving pain medication and was notified by the nurse that patient's heart rate was ranging from 40-95.  Patient was asymptomatic.  She also noted bigeminy on the monitor. Patient denies any associated chest pain, shortness of breath.  He was not hypotensive.  EKG shows some sinus tachycardia with bigeminy noted.  No ischemic changes.  Concern for possible septic arthritis.  Spoke with Dr. Jena Gauss who discussed that we could aspirate wrist joint in the ED possibly or treat symptomatically with antibiotics and NSAIDs and follow-up in the hand clinic tomorrow.  Patient's pain has been difficult to manage in the ED.  He continues to have exquisite tenderness to the range of motion of the left wrist.  Feel the patient needs to be admitted for pain control with hand consult in the a.m.  I discussed with Dr. Selena Batten with hospital medicine.  He agrees to admission.  Patient awaiting bed request at this time.  Patient remains hemodynamic stable at this time.  Resting comfortably in the bed additional pain medication was ordered.  Was also started on vancomycin.  CRP and sed rate were ordered.  Pt seen and eval by my attending who is agreeable  with the above plan.    Final Clinical Impressions(s) / ED Diagnoses   Final diagnoses:  Pain, wrist, left    ED Discharge Orders    None       Wallace Keller 05/01/17 0136    Terrilee Files, MD 05/01/17 [phone]

## 2017-05-01 DIAGNOSIS — Z6841 Body Mass Index (BMI) 40.0 and over, adult: Secondary | ICD-10-CM | POA: Diagnosis not present

## 2017-05-01 DIAGNOSIS — L03114 Cellulitis of left upper limb: Secondary | ICD-10-CM | POA: Diagnosis present

## 2017-05-01 DIAGNOSIS — M25532 Pain in left wrist: Secondary | ICD-10-CM | POA: Insufficient documentation

## 2017-05-01 DIAGNOSIS — M009 Pyogenic arthritis, unspecified: Secondary | ICD-10-CM | POA: Diagnosis present

## 2017-05-01 DIAGNOSIS — G8929 Other chronic pain: Secondary | ICD-10-CM | POA: Diagnosis present

## 2017-05-01 DIAGNOSIS — E1142 Type 2 diabetes mellitus with diabetic polyneuropathy: Secondary | ICD-10-CM | POA: Diagnosis present

## 2017-05-01 DIAGNOSIS — J449 Chronic obstructive pulmonary disease, unspecified: Secondary | ICD-10-CM | POA: Diagnosis present

## 2017-05-01 DIAGNOSIS — Z96643 Presence of artificial hip joint, bilateral: Secondary | ICD-10-CM | POA: Diagnosis present

## 2017-05-01 DIAGNOSIS — I10 Essential (primary) hypertension: Secondary | ICD-10-CM | POA: Diagnosis present

## 2017-05-01 DIAGNOSIS — Z96653 Presence of artificial knee joint, bilateral: Secondary | ICD-10-CM | POA: Diagnosis present

## 2017-05-01 DIAGNOSIS — Z87891 Personal history of nicotine dependence: Secondary | ICD-10-CM | POA: Diagnosis not present

## 2017-05-01 DIAGNOSIS — G4733 Obstructive sleep apnea (adult) (pediatric): Secondary | ICD-10-CM | POA: Diagnosis present

## 2017-05-01 DIAGNOSIS — M11232 Other chondrocalcinosis, left wrist: Secondary | ICD-10-CM | POA: Diagnosis present

## 2017-05-01 DIAGNOSIS — M069 Rheumatoid arthritis, unspecified: Secondary | ICD-10-CM | POA: Diagnosis present

## 2017-05-01 LAB — C-REACTIVE PROTEIN: CRP: 5.5 mg/dL — ABNORMAL HIGH (ref <1.0)

## 2017-05-01 LAB — SEDIMENTATION RATE: Sed Rate: 32 mm/hr — ABNORMAL HIGH (ref 0–16)

## 2017-05-01 MED ORDER — ALBUTEROL SULFATE (2.5 MG/3ML) 0.083% IN NEBU: 2.5000 mg | INHALATION_SOLUTION | Freq: Four times a day (QID) | RESPIRATORY_TRACT | Status: DC | PRN

## 2017-05-01 MED ORDER — FENTANYL CITRATE (PF) 100 MCG/2ML IJ SOLN
50.0000 ug | Freq: Once | INTRAMUSCULAR | Status: AC
  Administered 2017-05-01: 50 ug via INTRAVENOUS

## 2017-05-01 MED ORDER — FENTANYL CITRATE (PF) 100 MCG/2ML IJ SOLN
50.0000 ug | Freq: Once | INTRAMUSCULAR | Status: AC
  Administered 2017-05-01: 50 ug via INTRAVENOUS
  Filled 2017-05-01: qty 2

## 2017-05-01 MED ORDER — FENTANYL CITRATE (PF) 100 MCG/2ML IJ SOLN
INTRAMUSCULAR | Status: AC
  Filled 2017-05-01: qty 2

## 2017-05-01 MED ORDER — ACETAMINOPHEN 650 MG RE SUPP: 650.0000 mg | Freq: Four times a day (QID) | RECTAL | Status: DC | PRN

## 2017-05-01 MED ORDER — FOLIC ACID 1 MG PO TABS
1.0000 mg | ORAL_TABLET | Freq: Every day | ORAL | Status: DC
  Administered 2017-05-02 – 2017-05-04 (×3): 1 mg via ORAL
  Filled 2017-05-01 (×3): qty 1

## 2017-05-01 MED ORDER — HYDROCODONE-ACETAMINOPHEN 5-325 MG PO TABS
2.0000 | ORAL_TABLET | Freq: Once | ORAL | Status: AC
  Administered 2017-05-01: 2 via ORAL
  Filled 2017-05-01: qty 2

## 2017-05-01 MED ORDER — KETOROLAC TROMETHAMINE 15 MG/ML IJ SOLN
15.0000 mg | Freq: Four times a day (QID) | INTRAMUSCULAR | Status: DC
  Administered 2017-05-01 – 2017-05-04 (×8): 15 mg via INTRAVENOUS
  Filled 2017-05-01 (×8): qty 1

## 2017-05-01 MED ORDER — ASPIRIN EC 81 MG PO TBEC
81.0000 mg | DELAYED_RELEASE_TABLET | Freq: Every day | ORAL | Status: DC
  Administered 2017-05-02 – 2017-05-04 (×3): 81 mg via ORAL
  Filled 2017-05-01 (×3): qty 1

## 2017-05-01 MED ORDER — SODIUM CHLORIDE 0.9% FLUSH: 3.0000 mL | INTRAVENOUS | Status: DC | PRN

## 2017-05-01 MED ORDER — POLYETHYLENE GLYCOL 3350 17 G PO PACK: 17.0000 g | PACK | Freq: Every day | ORAL | Status: DC | PRN

## 2017-05-01 MED ORDER — ACETAMINOPHEN 325 MG PO TABS: 650.0000 mg | ORAL_TABLET | Freq: Four times a day (QID) | ORAL | Status: DC | PRN

## 2017-05-01 MED ORDER — SODIUM CHLORIDE 0.9 % IV SOLN
250.0000 mL | INTRAVENOUS | Status: DC | PRN
  Administered 2017-05-02: 250 mL via INTRAVENOUS

## 2017-05-01 MED ORDER — ROPINIROLE HCL 1 MG PO TABS
1.0000 mg | ORAL_TABLET | Freq: Three times a day (TID) | ORAL | Status: DC | PRN
  Administered 2017-05-01: 1 mg via ORAL
  Filled 2017-05-01: qty 1

## 2017-05-01 MED ORDER — FLUTICASONE FUROATE-VILANTEROL 100-25 MCG/INH IN AEPB
1.0000 | INHALATION_SPRAY | Freq: Every day | RESPIRATORY_TRACT | Status: DC
  Administered 2017-05-02 – 2017-05-04 (×3): 1 via RESPIRATORY_TRACT
  Filled 2017-05-01 (×2): qty 28

## 2017-05-01 MED ORDER — SODIUM CHLORIDE 0.9% FLUSH
3.0000 mL | Freq: Two times a day (BID) | INTRAVENOUS | Status: DC
  Administered 2017-05-02 – 2017-05-04 (×4): 3 mL via INTRAVENOUS

## 2017-05-01 MED ORDER — VANCOMYCIN HCL IN DEXTROSE 1-5 GM/200ML-% IV SOLN
1000.0000 mg | Freq: Two times a day (BID) | INTRAVENOUS | Status: DC
  Filled 2017-05-01: qty 200

## 2017-05-01 MED ORDER — ONDANSETRON HCL 4 MG PO TABS: 4.0000 mg | ORAL_TABLET | Freq: Four times a day (QID) | ORAL | Status: DC | PRN

## 2017-05-01 MED ORDER — OXYCODONE HCL 5 MG PO TABS
10.0000 mg | ORAL_TABLET | Freq: Three times a day (TID) | ORAL | Status: DC | PRN
  Administered 2017-05-01 – 2017-05-02 (×2): 10 mg via ORAL
  Filled 2017-05-01 (×2): qty 2

## 2017-05-01 MED ORDER — DOCUSATE SODIUM 100 MG PO CAPS
100.0000 mg | ORAL_CAPSULE | Freq: Two times a day (BID) | ORAL | Status: DC
  Administered 2017-05-02 – 2017-05-04 (×5): 100 mg via ORAL
  Filled 2017-05-01 (×6): qty 1

## 2017-05-01 MED ORDER — GABAPENTIN 300 MG PO CAPS
300.0000 mg | ORAL_CAPSULE | Freq: Three times a day (TID) | ORAL | Status: DC
  Administered 2017-05-01 – 2017-05-04 (×8): 300 mg via ORAL
  Filled 2017-05-01 (×8): qty 1

## 2017-05-01 MED ORDER — OXYCODONE-ACETAMINOPHEN 5-325 MG PO TABS
1.0000 | ORAL_TABLET | Freq: Once | ORAL | Status: AC
  Administered 2017-05-01: 1 via ORAL
  Filled 2017-05-01: qty 1

## 2017-05-01 MED ORDER — TAMSULOSIN HCL 0.4 MG PO CAPS
0.4000 mg | ORAL_CAPSULE | Freq: Every day | ORAL | Status: DC
  Administered 2017-05-02 – 2017-05-04 (×3): 0.4 mg via ORAL
  Filled 2017-05-01 (×3): qty 1

## 2017-05-01 MED ORDER — VANCOMYCIN HCL 10 G IV SOLR
1250.0000 mg | Freq: Two times a day (BID) | INTRAVENOUS | Status: DC
  Administered 2017-05-01 – 2017-05-04 (×6): 1250 mg via INTRAVENOUS
  Filled 2017-05-01 (×6): qty 1250

## 2017-05-01 MED ORDER — ENOXAPARIN SODIUM 40 MG/0.4ML ~~LOC~~ SOLN
40.0000 mg | SUBCUTANEOUS | Status: DC
  Filled 2017-05-01 (×2): qty 0.4

## 2017-05-01 MED ORDER — ONDANSETRON HCL 4 MG/2ML IJ SOLN: 4.0000 mg | Freq: Four times a day (QID) | INTRAMUSCULAR | Status: DC | PRN

## 2017-05-01 NOTE — Progress Notes (Signed)
Pharmacy Antibiotic Note  Anthony Friedman is a 67 y.o. male admitted on 04/30/2017 with cellulitis.  Pharmacy has been consulted for vancomycin dosing. -WBC= 11,3, afeb, CrCl > 120  Pt is transferred from [facility] today noticed no ongoing vancomycin order after the loading dose (2g vanc given at 2015 on 2/24 ). Ortho consulted, will see patient tomorrow AM  Plan: Vancomycin 1250 mg IV Q 12 hrs for now.  F/u plans in AM  Height: 5\' 9"  (175.3 cm) Weight: 275 lb (124.7 kg) IBW/kg (Calculated) : 70.7  Temp (24hrs), Avg:99.7 F (37.6 C), Min:99.7 F (37.6 C), Max:99.7 F (37.6 C)  Recent Labs  Lab 04/30/17 2100  WBC 11.3*  CREATININE 0.80    Estimated Creatinine Clearance: 120.2 mL/min (by C-G formula based on SCr of 0.8 mg/dL).    Allergies  Allergen Reactions  . Atenolol Other (See Comments)    Lowers heart rate  . Sulfa Antibiotics Other (See Comments)    Blood in urine       Thank you for allowing pharmacy to be a part of this patient's care.  05/02/17, PharmD, BCPS  Clinical Pharmacist  Pager: [phone]   05/01/2017 9:30 PM

## 2017-05-01 NOTE — ED Notes (Signed)
Labs obtained. Pt resting/ sleeping. No changes. Alert, NAD, calm, interactive, resps e/u, speaking in clear complete sentences, no dyspnea noted, skin W&D, VSS.

## 2017-05-01 NOTE — H&P (Signed)
[facility] History and Physical   Patient: Anthony Friedman EHM:[id number]   PCP: Junie Panning, NP DOB: 08/26/1950   DOA: 04/30/2017   DOS: 05/01/2017   DOS: the patient was seen and examined on 05/01/2017  Patient coming from: The patient is coming from home.  Chief Complaint: left wrist pain  HPI: Reynaldo Rossman is a 67 y.o. male with Past medical history of rheumatoid arthritis, COPD, type II DM, chronic venous insufficiency. Patient presented with complaints of left wrist pain.  Initially was seen at [facility].  Patient woke up from his sleep with this pain as well as redness.  Denies having any injury or trauma. No fever no chills.  No recent change in medication.  No nausea no vomiting no diarrhea no abdominal pain no rash anywhere else.  Denies having any night sweats or weight loss. No swelling of the legs.  ED Course: Patient was given IV vancomycin, x-ray of the wrist was done which was not showing any acute abnormality.  Orthopedics were consulted who after reviewing the chart electronically felt patient should be treated with oral antibiotics.  Due to uncontrollable pain patient was referred for admission and accepted yesterday.  At his baseline ambulates without support And is independent for most of his ADL; manages his medication on his own.  Review of Systems: as mentioned in the history of present illness.  All other systems reviewed and are negative.  Past Medical History:  Diagnosis Date  . Atelectasis   . 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 Chronic allergic rhinitis   . Chronic venous insufficiency    superficial  . COPD (chronic obstructive pulmonary disease) ([place])   . Cough   . Diabetes mellitus without complication ([place])    not on medications since  surgery 10/2015   . Edema    lower extremities  . Elevated liver enzymes    d/t arava  . Family history of adverse reaction to anesthesia    pts  mother had difficulty with anesthesia - pt not sure what difficulties were   . Hypertension    no longer on blood pressure medications since 10/25/2015 per patient   . Kidney stones    hx of   . Leg pain   . Liver enlargement    d/t arava  . Obesity   . Pallor   . Pneumonia    hx of times 3; pt states is current with pneumonia vaccine  . RA (rheumatoid arthritis) (HCC)     rheumatoid arthritis   . Sleep apnea    say he uses a mouth guard that incremently increases airflow   . SOBOE (shortness of breath on exertion)   . Superficial thrombophlebitis    subacute-on anticoagulation  . Urinary frequency   . Urinary tract bacterial infections    hx of   . Venous stasis    changes   Past Surgical History:  Procedure Laterality Date  . HAND SURGERY  1965   right hand  . I&D KNEE WITH POLY EXCHANGE Left 11/04/2016   Procedure: LEFT KNEE POLY EXCHANGE;  Surgeon: Mcarthur Rossetti, MD;  Location: [facility];  Service: Orthopedics;  Laterality: Left;  . KNEE SURGERY     arthroscopic right and left knee  . LAMINECTOMY  1988   and fusion; L4, L5, S1  . LAMINECTOMY  2007   and fusion L1, L2, L3  . LAPAROSCOPIC GASTRIC SLEEVE RESECTION N/A 10/26/2015  Procedure: LAPAROSCOPIC GASTRIC SLEEVE RESECTION, UPPER ENDO;  Surgeon: Alphonsa Overall, MD;  Location: [facility];  Service: General;  Laterality: N/A;  . OTHER SURGICAL HISTORY     polynidal cyst  . REPLACEMENT TOTAL KNEE  2009   left 2009; right 2014  . SPINAL CORD STIMULATOR IMPLANT     to control back pain   . TOTAL HIP ARTHROPLASTY Right 08/08/2014   Procedure: RIGHT TOTAL HIP ARTHROPLASTY ANTERIOR APPROACH;  Surgeon: Mcarthur Rossetti, MD;  Location[facility];  Service: Orthopedics;  Laterality: Right;  . TOTAL HIP ARTHROPLASTY Left 12/11/2015   Procedure: LEFT TOTAL HIP ARTHROPLASTY ANTERIOR APPROACH;  Surgeon: Mcarthur Rossetti, MD;  Location: [facility];  Service: Orthopedics;  Laterality: Left;  . UMBILICAL HERNIA REPAIR      Social History:  reports that he quit smoking about 7 months ago. His smoking use included cigarettes. He has a 90.00 pack-year smoking history. he has never used smokeless tobacco. He reports that he does not drink alcohol or use drugs.  Allergies  Allergen Reactions  . Atenolol Other (See Comments)    Lowers heart rate  . Sulfa Antibiotics Other (See Comments)    Blood in urine    Family History  Problem Relation Age of Onset  . Heart attack Father   . Esophageal cancer Mother   . Other Mother        Tachycardia  . Diabetes Brother      Prior to Admission medications   Medication Sig Start Date End Date Taking? Authorizing Provider  albuterol (PROAIR HFA) 108 (90 Base) MCG/ACT inhaler Inhale 2 puffs into the lungs every 6 (six) hours as needed for wheezing.   Yes [provider]  aspirin EC 81 MG tablet Take 1 tablet (81 mg total) by mouth 2 (two) times daily after a meal. Patient taking differently: Take 81 mg by mouth daily.  11/05/16  Yes Mcarthur Rossetti, MD  Calcium Carb-Cholecalciferol (CALCIUM 600 + D PO) Take 1 tablet by mouth 3 (three) times daily.    Yes [provider]  Cyanocobalamin (B-12) 500 MCG SUBL Place 500 mcg under the tongue See admin instructions. Take one tablet (500 mcg) sublingually twice weekly - Monday and Thursday   Yes [provider]  fluticasone furoate-vilanterol (BREO ELLIPTA) 100-25 MCG/INH AEPB Inhale 1 puff into the lungs daily. 12/13/16  Yes Juanito Doom, MD  folic acid (FOLVITE) 299 MCG tablet Take 800 mcg by mouth daily.   Yes [provider]  gabapentin (NEURONTIN) 300 MG capsule Take 300 mg by mouth 3 (three) times daily.  12/27/16  Yes [provider]  methotrexate (RHEUMATREX) 2.5 MG tablet Take 20 mg by mouth every Friday. Caution:Chemotherapy. Protect from light. Takes 8 pills on Fridays    Yes [provider]  Multiple Vitamin (MULTIVITAMIN WITH MINERALS) TABS tablet  Take 1 tablet by mouth 2 (two) times daily.   Yes [provider]  Oxycodone HCl 10 MG TABS Take 10 mg by mouth every 8 (eight) hours as needed (pain).  09/28/16  Yes [provider]  rOPINIRole (REQUIP) 1 MG tablet Take 1 mg by mouth 3 (three) times daily as needed (restless legs).    Yes [provider]  Tamsulosin HCl (FLOMAX) 0.4 MG CAPS Take 0.4 mg by mouth daily.    Yes [provider]  HYDROcodone-acetaminophen (NORCO) 10-325 MG tablet Take 1 tablet by mouth every 6 (six) hours as needed. Patient not taking: Reported on 05/01/2017 02/15/17  Mcarthur Rossetti, MD  oxyCODONE (OXY IR/ROXICODONE) 5 MG immediate release tablet Take 1-3 tablets (5-15 mg total) by mouth every 4 (four) hours as needed for breakthrough pain. Patient not taking: Reported on 02/15/2017 11/05/16   Mcarthur Rossetti, MD    Physical Exam: Vitals:   05/01/17 1801 05/01/17 1815 05/01/17 1830 05/01/17 2052  BP: 122/77 (!) 127/48 124/61 129/75  Pulse:  68 93 90  Resp: _0 Temp:    99.7 F (37.6 C)  TempSrc:    Oral  SpO2:  97% 95% 95%  Weight:      Height:        General: Alert, Awake and Oriented to Time, Place and Person. Appear in moderate distress, affect appropriate Eyes: PERRL, Conjunctiva normal ENT: Oral Mucosa clear moist. Neck: no JVD, no Abnormal Mass Or lumps Cardiovascular: S1 and S2 Present, no Murmur, Peripheral Pulses Present Respiratory: normal respiratory effort, Bilateral Air entry equal and Decreased, no use of accessory muscle, Clear to Auscultation, no Crackles, no wheezes Abdomen: Bowel Sound present, Soft and no tenderness, no hernia Skin: right wrist redness, no Rash, no induration Extremities: no Pedal edema, no calf tenderness Neurologic: Grossly no focal neuro deficit. Bilaterally Equal motor strength  Labs on Admission:  CBC: Recent Labs  Lab 04/30/17 2100  WBC 11.3*  NEUTROABS 8.8*  HGB 12.8*  HCT 37.5*  MCV 94.9   PLT 169   Basic Metabolic Panel: Recent Labs  Lab 04/30/17 2100  NA 132*  K 4.2  CL 98*  CO2 24  GLUCOSE 115*  BUN 18  CREATININE 0.80  CALCIUM 8.9  MG 1.8   GFR: Estimated Creatinine Clearance: 120.2 mL/min (by C-G formula based on SCr of 0.8 mg/dL). Liver Function Tests: No results for input(s): AST, ALT, ALKPHOS, BILITOT, PROT, ALBUMIN in the last 168 hours. No results for input(s): LIPASE, AMYLASE in the last 168 hours. No results for input(s): AMMONIA in the last 168 hours. Coagulation Profile: No results for input(s): INR, PROTIME in the last 168 hours. Cardiac Enzymes: No results for input(s): CKTOTAL, CKMB, CKMBINDEX, TROPONINI in the last 168 hours. BNP (last 3 results) No results for input(s): PROBNP in the last 8760 hours. HbA1C: No results for input(s): HGBA1C in the last 72 hours. CBG: No results for input(s): GLUCAP in the last 168 hours. Lipid Profile: No results for input(s): CHOL, HDL, LDLCALC, TRIG, CHOLHDL, LDLDIRECT in the last 72 hours. Thyroid Function Tests: No results for input(s): TSH, T4TOTAL, FREET4, T3FREE, THYROIDAB in the last 72 hours. Anemia Panel: No results for input(s): VITAMINB12, FOLATE, FERRITIN, TIBC, IRON, RETICCTPCT in the last 72 hours. Urine analysis: No results found for: COLORURINE, APPEARANCEUR, LABSPEC, [place], GLUCOSEU, HGBUR, BILIRUBINUR, KETONESUR, PROTEINUR, UROBILINOGEN, NITRITE, LEUKOCYTESUR  Radiological Exams on Admission: Dg Wrist Complete Left  Result Date: 04/30/2017 CLINICAL DATA:  C/o severe pain w/mild swelling in Lt wrist/hand today getting progressively worse, no injury. Hx RA EXAM: LEFT WRIST - COMPLETE 3+ VIEW COMPARISON:  None. FINDINGS: No fracture or dislocation. There is widening of the scapholunate interval to 5 mm consistent with disruption of the scapholunate ligament. There is narrowing of the radial aspect of the radiocarpal joint. Remaining joints are normally spaced and aligned. No erosions.  There is diffuse surrounding soft tissue swelling. Arterial vascular calcifications are noted anteriorly. IMPRESSION: 1. No fracture or dislocation. 2. 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 3. Diffuse surrounding soft tissue swelling. There may be active synovitis. No erosions  visualized. Electronically Signed   By: Lajean Manes M.D.   On: 04/30/2017 18:38   Assessment/Plan 1. Cellulitis of left hand Presents with severe pain of the left hand. X-ray shows cellulitis of the hand. Also says widening of the scapholunate interval. Hand surgery was initially consulted. Recommend no surgical intervention. ESR and CRP mildly elevated. Patient does not feel any symptomatically different since yesterday. We will continue with IV antibiotics continue with pain control overnight. Also provide left arm splint. If no improvement orthopedic need to be reconsulted tomorrow. Patient was initially discussed with Dr. Doreatha Martin from sports medicine.  2.RA (rheumatoid arthritis) (HCC) Hold methotrexate Monitor  3  COPD gold stage C. Continue inhaler No acute abnormality   4.  Hypertension BP stable. Continue home meds  5  Chronic pain Continue home meds  6  Diabetes mellitus, type 2 ([place]) Do not see any meds Monitor     Morbid obesity (HCC)   OSA (obstructive sleep apnea)  Nutrition: cardiac diet DVT Prophylaxis: subcutaneous Heparin  Advance goals of care discussion: full code   Consults: orthopedics   Family Communication: no family was present at bedside, at the time of interview.  Disposition: Admitted as inpatient, med-surge unit. Likely to be discharged home, in 2-3 days.  Author: Berle Mull, MD [facility] Pager: [phone] 05/01/2017  If 7PM-7AM, please contact night-coverage [email or web address] Password [email or web address]

## 2017-05-01 NOTE — ED Notes (Signed)
Report given to [facility] ED charge , pt to be transferred to ED for bed hold so ORtho can consult on pt

## 2017-05-01 NOTE — ED Notes (Signed)
No changes, resting/sleeping, NAD, calm, bigeminy on monitor. VSS.

## 2017-05-01 NOTE — ED Provider Notes (Signed)
Patient 68 year old male who was seen 2 shifts ago by provider and found to have an erythematous left wrist.  Decision made not to tap but rather refer to Ortho who would like to see patient and examine the wrist themselves.  Patient was given antibiotics here.  Unfortunately patient has been waiting for bed for 15 hours.  I believe that the wrist looks slightly worse now than it did out from the picture from previous provider.  Will transfer the patient over to ED so that is ortho is able to see him in the emergency department.   Abelino Derrick, MD 05/01/17 1620

## 2017-05-01 NOTE — ED Notes (Addendum)
EDPA into room, pt/family updated. Pt sleeping/ resting, no changes. Vanc infusing. VSS.

## 2017-05-01 NOTE — ED Notes (Signed)
Pt report obtained from [facility].  Pt is A&O x 4.  Reports L wrist and hand pain, rates it at 10/10.  Mild erythema noted to  L wrist with swelling.  Pt has his L arm elevated on a pillow.

## 2017-05-01 NOTE — ED Notes (Signed)
New order per [facility] fentanyl   IV given prior to [facility] transport

## 2017-05-01 NOTE — ED Notes (Addendum)
Pt woke from sleep with severe pain, rates 10/10. No other changes. Alert, NAD, calm. VSS. Remains quadgeminy on monitor.

## 2017-05-01 NOTE — ED Notes (Signed)
Patient maintained NPO 

## 2017-05-01 NOTE — Progress Notes (Signed)
67 yo male with hx of rheumatoid arthritis has c/o left wrist pain and swelling.  Xray L wrist IMPRESSION: 1. No fracture or dislocation. 2. 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 3. Diffuse surrounding soft tissue swelling. There may be active synovitis. No erosions visualized  + erythema over wrist and , ED concerned regarding septic joint and has placed patient on vanco.   ED consulted orthopedics, but is not sure if they will show up in AM.  I would consult orthopedics once patient arrives to hospital.  [facility])

## 2017-05-02 ENCOUNTER — Inpatient Hospital Stay (HOSPITAL_COMMUNITY): Payer: Medicare HMO

## 2017-05-02 ENCOUNTER — Encounter (HOSPITAL_COMMUNITY): Payer: Self-pay | Admitting: General Practice

## 2017-05-02 DIAGNOSIS — M25532 Pain in left wrist: Secondary | ICD-10-CM

## 2017-05-02 DIAGNOSIS — I10 Essential (primary) hypertension: Secondary | ICD-10-CM

## 2017-05-02 DIAGNOSIS — G4733 Obstructive sleep apnea (adult) (pediatric): Secondary | ICD-10-CM

## 2017-05-02 DIAGNOSIS — L03114 Cellulitis of left upper limb: Secondary | ICD-10-CM | POA: Diagnosis present

## 2017-05-02 DIAGNOSIS — M069 Rheumatoid arthritis, unspecified: Secondary | ICD-10-CM

## 2017-05-02 LAB — COMPREHENSIVE METABOLIC PANEL
ALK PHOS: 77 U/L (ref 38–126)
ALT: 21 U/L (ref 17–63)
ANION GAP: 11 (ref 5–15)
AST: 41 U/L (ref 15–41)
Albumin: 3.3 g/dL — ABNORMAL LOW (ref 3.5–5.0)
BILIRUBIN TOTAL: 1.6 mg/dL — AB (ref 0.3–1.2)
BUN: 17 mg/dL (ref 6–20)
CO2: 26 mmol/L (ref 22–32)
Calcium: 8.9 mg/dL (ref 8.9–10.3)
Chloride: 100 mmol/L — ABNORMAL LOW (ref 101–111)
Creatinine, Ser: 0.94 mg/dL (ref 0.61–1.24)
GFR calc Af Amer: >60 mL/min (ref >60)
GLUCOSE: 79 mg/dL (ref 65–99)
Potassium: 4.1 mmol/L (ref 3.5–5.1)
Sodium: 137 mmol/L (ref 135–145)
TOTAL PROTEIN: 6.9 g/dL (ref 6.5–8.1)

## 2017-05-02 LAB — CBC WITH DIFFERENTIAL/PLATELET
Basophils Absolute: 0 10*3/uL (ref 0.0–0.1)
Basophils Relative: 0 %
Eosinophils Absolute: 0.3 10*3/uL (ref 0.0–0.7)
Eosinophils Relative: 3 %
HCT: 46 % (ref 39.0–52.0)
HEMOGLOBIN: 15.4 g/dL (ref 13.0–17.0)
LYMPHS ABS: 1.8 10*3/uL (ref 0.7–4.0)
Lymphocytes Relative: 15 %
MCH: 32.9 pg (ref 26.0–34.0)
MCHC: 33.5 g/dL (ref 30.0–36.0)
MCV: 98.3 fL (ref 78.0–100.0)
MONO ABS: 1.2 10*3/uL — AB (ref 0.1–1.0)
MONOS PCT: 10 %
NEUTROS ABS: 8.6 10*3/uL — AB (ref 1.7–7.7)
NEUTROS PCT: 72 %
Platelets: 214 10*3/uL (ref 150–400)
RBC: 4.68 MIL/uL (ref 4.22–5.81)
RDW: 15.4 % (ref 11.5–15.5)
WBC: 11.9 10*3/uL — ABNORMAL HIGH (ref 4.0–10.5)

## 2017-05-02 LAB — GLUCOSE, CAPILLARY
GLUCOSE-CAPILLARY: 88 mg/dL (ref 65–99)
Glucose-Capillary: 133 mg/dL — ABNORMAL HIGH (ref 65–99)

## 2017-05-02 LAB — HIV ANTIBODY (ROUTINE TESTING W REFLEX): HIV SCREEN 4TH GENERATION: NONREACTIVE

## 2017-05-02 MED ORDER — HYDROMORPHONE HCL 1 MG/ML IJ SOLN
1.0000 mg | INTRAMUSCULAR | Status: DC | PRN
  Administered 2017-05-02 – 2017-05-04 (×6): 1 mg via INTRAVENOUS
  Filled 2017-05-02 (×7): qty 1

## 2017-05-02 MED ORDER — OXYCODONE HCL 5 MG PO TABS
10.0000 mg | ORAL_TABLET | Freq: Three times a day (TID) | ORAL | Status: DC | PRN
  Administered 2017-05-02 – 2017-05-04 (×4): 10 mg via ORAL
  Filled 2017-05-02 (×4): qty 2

## 2017-05-02 MED ORDER — SODIUM CHLORIDE 0.9% FLUSH: 10.0000 mL | INTRAVENOUS | Status: DC | PRN

## 2017-05-02 MED ORDER — ENOXAPARIN SODIUM 40 MG/0.4ML ~~LOC~~ SOLN
40.0000 mg | SUBCUTANEOUS | Status: DC
  Administered 2017-05-04: 40 mg via SUBCUTANEOUS
  Filled 2017-05-02: qty 0.4

## 2017-05-02 MED ORDER — IOPAMIDOL (ISOVUE-300) INJECTION 61%
INTRAVENOUS | Status: AC
  Administered 2017-05-02: 100 mL
  Filled 2017-05-02: qty 100

## 2017-05-02 NOTE — Consult Note (Signed)
Orthopaedic Trauma Service (OTS) Consult   Patient ID: Kile Kabler MRN: [id number] DOB/AGE: Oct 06, 1950 67 y.o.  Reason for Consult:Left wrist pain Referring Physician: Aletta Edouard, MD [facility]  HPI: Tillman Kazmierski is an 67 y.o. male who is being seen in consultation at the request of Dr. Melina Copa for evaluation of left wrist pain and swelling.  This is a pleasant male who developed acute onset and left wrist pain and swelling.  [facility] x-rays were obtained.  They contacted me and I reviewed clinical images as well as radiographic images and felt that he likely could be treated as an outpatient.  However he continued to have significant amount of pain and inflammatory markers were drawn which showed elevation in those.  As a result he was started on empiric vancomycin and was transferred to [facility] for evaluation by hand surgery as well as continuation of the antibiotics.  Since he was started on the vancomycin his pain is not gotten significantly better.  He has been placed in a removable wrist splint.  He feels better when he is immobilized.  He has not been running any fevers.  But notes that he has pain both volarly and dorsally about his wrist.  Past Medical History:  Diagnosis Date  . Atelectasis   . 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 Chronic allergic rhinitis   . Chronic venous insufficiency    superficial  . COPD (chronic obstructive pulmonary disease) ([place])   . Cough   . Diabetes mellitus without complication ([place])    not on medications since  surgery 10/2015   . Edema    lower extremities  . Elevated liver enzymes    d/t arava  . Family history of adverse reaction to anesthesia    pts mother had difficulty with anesthesia - pt not sure what difficulties were   . Hypertension    no longer on blood pressure medications since 10/25/2015 per patient   . Kidney stones     hx of   . Leg pain   . Liver enlargement    d/t arava  . Obesity   . Pallor   . Pneumonia    hx of times 3; pt states is current with pneumonia vaccine  . RA (rheumatoid arthritis) (HCC)     rheumatoid arthritis   . Sleep apnea    say he uses a mouth guard that incremently increases airflow   . SOBOE (shortness of breath on exertion)   . Superficial thrombophlebitis    subacute-on anticoagulation  . Urinary frequency   . Urinary tract bacterial infections    hx of   . Venous stasis    changes    Past Surgical History:  Procedure Laterality Date  . HAND SURGERY  1965   right hand  . I&D KNEE WITH POLY EXCHANGE Left 11/04/2016   Procedure: LEFT KNEE POLY EXCHANGE;  Surgeon: Mcarthur Rossetti, MD;  [facility];  Service: Orthopedics;  Laterality: Left;  . KNEE SURGERY     arthroscopic right and left knee  . LAMINECTOMY  1988   and fusion; L4, L5, S1  . LAMINECTOMY  2007   and fusion L1, L2, L3  . LAPAROSCOPIC GASTRIC SLEEVE RESECTION N/A 10/26/2015   Procedure: LAPAROSCOPIC GASTRIC SLEEVE RESECTION, UPPER ENDO;  Surgeon: Alphonsa Overall, MD;  Location[facility];  Service: General;  Laterality: N/A;  .  OTHER SURGICAL HISTORY     polynidal cyst  . REPLACEMENT TOTAL KNEE  2009   left 2009; right 2014  . SPINAL CORD STIMULATOR IMPLANT     to control back pain   . TOTAL HIP ARTHROPLASTY Right 08/08/2014   Procedure: RIGHT TOTAL HIP ARTHROPLASTY ANTERIOR APPROACH;  Surgeon: Mcarthur Rossetti, MD;  [facility];  Service: Orthopedics;  Laterality: Right;  . TOTAL HIP ARTHROPLASTY Left 12/11/2015   Procedure: LEFT TOTAL HIP ARTHROPLASTY ANTERIOR APPROACH;  Surgeon: Mcarthur Rossetti, MD;  [facility];  Service: Orthopedics;  Laterality: Left;  . UMBILICAL HERNIA REPAIR      Family History  Problem Relation Age of Onset  . Heart attack Father   . Esophageal cancer Mother   . Other Mother        Tachycardia  . Diabetes Brother     Social History:   reports that he quit smoking about 7 months ago. His smoking use included cigarettes. He has a 90.00 pack-year smoking history. he has never used smokeless tobacco. He reports that he does not drink alcohol or use drugs.  Allergies:  Allergies  Allergen Reactions  . Atenolol Other (See Comments)    Lowers heart rate  . Sulfa Antibiotics Other (See Comments)    Blood in urine     Medications:  No current facility-administered medications on file prior to encounter.    Current Outpatient Medications on File Prior to Encounter  Medication Sig Dispense Refill  . albuterol (PROAIR HFA) 108 (90 Base) MCG/ACT inhaler Inhale 2 puffs into the lungs every 6 (six) hours as needed for wheezing.    Marland Kitchen aspirin EC 81 MG tablet Take 1 tablet (81 mg total) by mouth 2 (two) times daily after a meal. (Patient taking differently: Take 81 mg by mouth daily. ) 60 tablet 0  . Calcium Carb-Cholecalciferol (CALCIUM 600 + D PO) Take 1 tablet by mouth 3 (three) times daily.     . Cyanocobalamin (B-12) 500 MCG SUBL Place 500 mcg under the tongue See admin instructions. Take one tablet (500 mcg) sublingually twice weekly - Monday and Thursday    . fluticasone furoate-vilanterol (BREO ELLIPTA) 100-25 MCG/INH AEPB Inhale 1 puff into the lungs daily. 60 each 2  . folic acid (FOLVITE) 749 MCG tablet Take 800 mcg by mouth daily.    Marland Kitchen gabapentin (NEURONTIN) 300 MG capsule Take 300 mg by mouth 3 (three) times daily.     . methotrexate (RHEUMATREX) 2.5 MG tablet Take 20 mg by mouth every Friday. Caution:Chemotherapy. Protect from light. Takes 8 pills on Fridays     . Multiple Vitamin (MULTIVITAMIN WITH MINERALS) TABS tablet Take 1 tablet by mouth 2 (two) times daily.    . Oxycodone HCl 10 MG TABS Take 10 mg by mouth every 8 (eight) hours as needed (pain).   0  . rOPINIRole (REQUIP) 1 MG tablet Take 1 mg by mouth 3 (three) times daily as needed (restless legs).     . Tamsulosin HCl (FLOMAX) 0.4 MG CAPS Take 0.4 mg by mouth  daily.     Marland Kitchen HYDROcodone-acetaminophen (NORCO) 10-325 MG tablet Take 1 tablet by mouth every 6 (six) hours as needed. (Patient not taking: Reported on 05/01/2017) 60 tablet 0  . oxyCODONE (OXY IR/ROXICODONE) 5 MG immediate release tablet Take 1-3 tablets (5-15 mg total) by mouth every 4 (four) hours as needed for breakthrough pain. (Patient not taking: Reported on 02/15/2017) 90 tablet 0    ROS: Constitutional: No  fever or chills Vision: No changes in vision ENT: No difficulty swallowing CV: No chest pain Pulm: No SOB or wheezing GI: No nausea or vomiting GU: No urgency or inability to hold urine Skin: No poor wound healing Neurologic: No numbness or tingling Psychiatric: No depression or anxiety Heme: No bruising Allergic: No reaction to medications or food   Exam: Blood pressure 138/73, pulse 91, temperature 98.6 F (37 C), temperature source Oral, resp. rate 18, height '5\' 9"'$  (1.753 m), weight 124.7 kg (275 lb), SpO2 97 %. General: No acute distress Orientation: Awake alert and oriented x3 Mood and Affect: Cooperative and pleasant Gait: Not assessed Coordination and balance: Within normal limits  Left upper extremity: Shows mildly erythematous wrist that is both swollen dorsally and volarly.  There is no focal collection however it is quite tender to palpation.  Short arc range of motion of the wrist within 10 degrees is not very painful but further flexion and extension causes significant amount of pain to where the patient almost jumps out of bed.  Flexion and extension of his fingers also cause significant discomfort.  He has sensation intact median, radial and ulnar nerve distributions.  His erythema does not tracked up to the dorsal forearm or volar forearm.  No lymphadenopathy.  Right upper extremity: skin without lesions. No tenderness to palpation. Full painless ROM, full strength in each muscle groups without evidence of instability.   Medical Decision Making: Imaging:  X-rays of his left wrist are reviewed which shows a likely chronic widening scapholunate interval with no other significant abnormalities.  There is notable soft tissue swelling in the soft tissues.  Labs:  CBC    Component Value Date/Time   WBC 11.9 (H) 05/02/2017 0548   RBC 4.68 05/02/2017 0548   HGB 15.4 05/02/2017 0548   HCT 46.0 05/02/2017 0548   PLT 214 05/02/2017 0548   MCV 98.3 05/02/2017 0548   MCH 32.9 05/02/2017 0548   MCHC 33.5 05/02/2017 0548   RDW 15.4 05/02/2017 0548   LYMPHSABS 1.8 05/02/2017 0548   MONOABS 1.2 (H) 05/02/2017 0548   EOSABS 0.3 05/02/2017 0548   BASOSABS 0.0 05/02/2017 0548   CR:5.5 ESR: 32  Medical history and chart was reviewed  Assessment/Plan: 67 year old male with history of RA with acute onset of left wrist pain and erythema  After examining the patient I feel that there are a couple of likely candidates for his pain and swelling.  The first is a possible septic wrist.  The other is a possible synovitis from his rheumatoid arthritis.  He has significant pain with motion of his fingers which should be less towards a septic wrist diagnosis.  I feel the most beneficial study would be an MRI however he is unable to obtain this due to a stimulator that he had placed previously.  As a result I feel that proceeding with a CT scan with and without contrast to evaluate for any fluid collections or effusion of his wrist is most appropriate.  Depending on the results of this we will make recommendations regarding treatment options.  We may have to proceed with aspiration of the wrist if the CT scan is negative for any fluid collections or synovitis.  Recommendations:  -Continue antibiotics -CT scan of wrist with and without contrast -Will reassess after CT scan   Shona Needles, MD [facility] [phone] (phone)

## 2017-05-02 NOTE — Progress Notes (Signed)
Orthopedic Tech Progress Note Patient Details:  Anthony Friedman January 06, 1951 [id number]  Ortho Devices Type of Ortho Device: (carter arm pillow) Ortho Device/Splint Location: lue Ortho Device/Splint Interventions: Application   Post Interventions Patient Tolerated: Well Instructions Provided: Care of device   Nikki Dom 05/02/2017, 3:07 PM

## 2017-05-02 NOTE — Evaluation (Signed)
Occupational Therapy Evaluation Patient Details Name: Anthony Friedman MRN: [id number] DOB: 1950/04/16 Today's Date: 05/02/2017    History of Present Illness 67 yo male admited iwth L wrist pain and wrist splint ordered. Wife reports during OT eval that patient did fall few days prior YJE:[id number], Bradycardia, , Chronic venous insufficiency, COPD (chronic obstructive pulmonary disease) (HCC),  Diabetes mellitus without complication (HCC), Edema, Elevated liver enzymes, Leg pain, Liver enlargement, Obesity, Pallor, Pneumonia, RA (rheumatoid arthritis) (HCC), Sleep apnea, SOBOE (shortness of breath on exertion), Superficial thrombophlebitis, Urinary frequency, Urinary tract bacterial infections, and Venous stasis. Bil TKAs, Bil hip surgeries, back surgery, spinal cord stimulator    Clinical Impression   PT admitted with L wrist pain. Pt currently with functional limitiations due to the deficits listed below (see OT problem list). Workup underway for further evaluation of L wrist edema. Pt currently requires (A) for LB and setup for adls for UB. Wife present during evaluation and reports pt is managing with her (A)  Pt will benefit from skilled OT to increase their independence and safety with adls and balance to allow discharge home without follow up at this time. OT to continue to follow pending further work up .     Follow Up Recommendations  No OT follow up    Equipment Recommendations  None recommended by OT    Recommendations for Other Services       Precautions / Restrictions Precautions Precautions: Fall Precaution Comments: L wrist splint Restrictions Other Position/Activity Restrictions: WBAT at this time no orders for restrictions      Mobility Bed Mobility Overal bed mobility: Independent             General bed mobility comments: in chair on arrival  Transfers Overall transfer level: Needs assistance Equipment used: None Transfers: Sit to/from Stand Sit to Stand:  Supervision         General transfer comment: Noted dependent on RUE push    Balance                                           ADL either performed or assessed with clinical judgement   ADL Overall ADL's : Needs assistance/impaired Eating/Feeding: Set up   Grooming: Set up Grooming Details (indicate cue type and reason): wife reports pt shaved face and washed face brushed teeth with setup Upper Body Bathing: Set up   Lower Body Bathing: Minimal assistance Lower Body Bathing Details (indicate cue type and reason): wife reports I helped him only because he had that IV in his hand         Toilet Transfer: Supervision/safety           Functional mobility during ADLs: Supervision/safety General ADL Comments: pt out in hall walking after session. pt holding L UE against body. pt declined sling when offered for comfort. pt educated on elevation and edema control. pt needed reinforcement of this education due to placing wrist in elevation positoin with digits in a dependent position. ortho tech called for a carter foam block for elevation. pt reporting arm sliding on pillows      Vision Baseline Vision/History: Wears glasses Wears Glasses: At all times       Perception     Praxis      Pertinent Vitals/Pain Pain Assessment: Faces Faces Pain Scale: Hurts even more Pain Location: L hand  Pain Descriptors / Indicators: Discomfort Pain  Intervention(s): Monitored during session;Premedicated before session;Repositioned;Other (comment)(c/o pain only with movemetn)     Hand Dominance Right   Extremity/Trunk Assessment Upper Extremity Assessment Upper Extremity Assessment: LUE deficits/detail LUE Deficits / Details: declined to remove hand from splint, verbalized being able to don doff but declined to demo. pt able to move digits 2-5 without incr pain. pt reports immediate pain with any thumb movement abduction,adduction flexion / extension   Lower  Extremity Assessment Lower Extremity Assessment: Defer to PT evaluation   Cervical / Trunk Assessment Cervical / Trunk Assessment: Other exceptions Cervical / Trunk Exceptions: hx of back surg / pain   Communication Communication Communication: No difficulties   Cognition Arousal/Alertness: Awake/alert Behavior During Therapy: WFL for tasks assessed/performed Overall Cognitive Status: Within Functional Limits for tasks assessed                                     General Comments  edema noted in L hand but pt declined to doff splint for further evaluation. pt reports frustration with delayed CT scan x8 hours per patient report    Exercises Exercises: Other exercises Other Exercises Other Exercises: education to complete flexion extension of digits as much as can tolerate in elevation position   Shoulder Instructions      Home Living Family/patient expects to be discharged to:: Private residence Living Arrangements: Spouse/significant other Available Help at Discharge: Family Type of Home: House Home Access: Stairs to enter Secretary/administrator of Steps: 1 Entrance Stairs-Rails: None Home Layout: One level     Bathroom Shower/Tub: Producer, television/film/video: Standard     Home Equipment: Environmental consultant - 2 wheels;Bedside commode;Cane - single point          Prior Functioning/Environment Level of Independence: Independent with assistive device(s)        Comments: used cane        OT Problem List: Decreased range of motion;Decreased strength;Decreased activity tolerance;Impaired balance (sitting and/or standing);Decreased knowledge of use of DME or AE;Decreased knowledge of precautions;Obesity;Impaired UE functional use;Pain;Increased edema      OT Treatment/Interventions: Self-care/ADL training;Therapeutic exercise;DME and/or AE instruction;Therapeutic activities;Splinting;Patient/family education;Balance training    OT Goals(Current goals can  be found in the care plan section) Acute Rehab OT Goals Patient Stated Goal: to get a CT to find out what is going on OT Goal Formulation: With patient/family Time For Goal Achievement: 05/16/17 Potential to Achieve Goals: Good  OT Frequency: Min 2X/week   Barriers to D/C:            Co-evaluation              AM-PAC PT "6 Clicks" Daily Activity     Outcome Measure Help from another person eating meals?: None Help from another person taking care of personal grooming?: None Help from another person toileting, which includes using toliet, bedpan, or urinal?: None Help from another person bathing (including washing, rinsing, drying)?: None Help from another person to put on and taking off regular upper body clothing?: None Help from another person to put on and taking off regular lower body clothing?: A Little 6 Click Score: 23   End of Session Nurse Communication: Mobility status;Precautions  Activity Tolerance: Patient tolerated treatment well Patient left: in chair;with call bell/phone within reach;with family/visitor present  OT Visit Diagnosis: Pain Pain - Right/Left: Left Pain - part of body: Hand  Time: 0865-7846 OT Time Calculation (min): 11 min Charges:  OT General Charges $OT Visit: 1 Visit OT Evaluation $OT Eval Low Complexity: 1 Low G-CodesMateo Friedman   OTR/L Pager: [phone] Office: [phone] .   Anthony Friedman 05/02/2017, 3:15 PM

## 2017-05-02 NOTE — Progress Notes (Signed)
Physical Therapy Evaluation Patient Details Name: Anthony Friedman MRN: [id number] DOB: 18-Jun-1950 Today's Date: 05/02/2017   History of Present Illness  Admitted with L wrist pain, wrist splint ordered;  has a pertinent past medical history of Atelectasis, Bradycardia, , Chronic venous insufficiency, COPD (chronic obstructive pulmonary disease) (HCC),  Diabetes mellitus without complication (HCC), Edema, Elevated liver enzymes, Leg pain, Liver enlargement, Obesity, Pallor, Pneumonia, RA (rheumatoid arthritis) (HCC), Sleep apnea, SOBOE (shortness of breath on exertion), Superficial thrombophlebitis, Urinary frequency, Urinary tract bacterial infections, and Venous stasis. Bil TKAs, Bil hip surgeries, back surgery, spinal cord stimulator  Clinical Impression   Pt admitted with above diagnosis. Pt currently with functional limitations due to the deficits listed below (see PT Problem List). Presents with noted gait assymetries and generalized weakness; Unable to walk further as we needed to setup IV Vancomycin; I anticipate he will make excellent progress, and will likely meet PT goals of progressive amb and stairs within 1-2 sessions;  Pt will benefit from skilled PT to increase their independence and safety with mobility to allow discharge to the venue listed below.       Follow Up Recommendations No PT follow up    Equipment Recommendations  None recommended by PT    Recommendations for Other Services OT consult(as ordered)     Precautions / Restrictions Restrictions Other Position/Activity Restrictions: So far, no weight bearing restrictions ordered      Mobility  Bed Mobility Overal bed mobility: Independent                Transfers Overall transfer level: Needs assistance Equipment used: None Transfers: Sit to/from Stand Sit to Stand: Supervision         General transfer comment: Noted dependent on RUE push  Ambulation/Gait Ambulation/Gait assistance:  Supervision Ambulation Distance (Feet): 20 Feet(to and from bathroom) Assistive device: None Gait Pattern/deviations: Step-through pattern;Decreased step length - right;Decreased step length - left;Decreased stride length     General Gait Details: Noted incr lateral sway with steps; gait distance limited by need to set up for IV Vanc  Stairs            Wheelchair Mobility    Modified Rankin (Stroke Patients Only)       Balance                                             Pertinent Vitals/Pain Pain Assessment: No/denies pain(had just recieved pain meds; quite painful prior to that)    Home Living Family/patient expects to be discharged to:: Private residence Living Arrangements: Spouse/significant other Available Help at Discharge: Family Type of Home: House Home Access: Stairs to enter Entrance Stairs-Rails: None Entrance Stairs-Number of Steps: 1 Home Layout: One level Home Equipment: Environmental consultant - 2 wheels;Bedside commode;Cane - single point      Prior Function Level of Independence: Independent with assistive device(s)         Comments: used cane     Hand Dominance        Extremity/Trunk Assessment   Upper Extremity Assessment Upper Extremity Assessment: Defer to OT evaluation    Lower Extremity Assessment Lower Extremity Assessment: Generalized weakness       Communication   Communication: No difficulties  Cognition Arousal/Alertness: Awake/alert Behavior During Therapy: WFL for tasks assessed/performed Overall Cognitive Status: Within Functional Limits for tasks assessed  General Comments      Exercises     Assessment/Plan    PT Assessment Patient needs continued PT services  PT Problem List Decreased strength;Decreased range of motion;Decreased activity tolerance;Decreased balance;Decreased mobility;Decreased knowledge of use of DME;Decreased knowledge of  precautions;Pain       PT Treatment Interventions DME instruction;Gait training;Stair training;Functional mobility training;Therapeutic activities;Therapeutic exercise;Balance training;Patient/family education    PT Goals (Current goals can be found in the Care Plan section)  Acute Rehab PT Goals Patient Stated Goal: Figure out what's going on with wrist PT Goal Formulation: With patient Time For Goal Achievement: 05/09/17 Potential to Achieve Goals: Good    Frequency Min 3X/week   Barriers to discharge        Co-evaluation               AM-PAC PT "6 Clicks" Daily Activity  Outcome Measure Difficulty turning over in bed (including adjusting bedclothes, sheets and blankets)?: None Difficulty moving from lying on back to sitting on the side of the bed? : None Difficulty sitting down on and standing up from a chair with arms (e.g., wheelchair, bedside commode, etc,.)?: A Little Help needed moving to and from a bed to chair (including a wheelchair)?: None Help needed walking in hospital room?: None Help needed climbing 3-5 steps with a railing? : A Little 6 Click Score: 22    End of Session Equipment Utilized During Treatment: Gait belt Activity Tolerance: Patient tolerated treatment well Patient left: in chair;with call bell/phone within reach;with nursing/sitter in room Nurse Communication: Mobility status PT Visit Diagnosis: Unsteadiness on feet (R26.81);Other abnormalities of gait and mobility (R26.89)    Time: 9937-1696 PT Time Calculation (min) (ACUTE ONLY): 13 min   Charges:   PT Evaluation $PT Eval Low Complexity: 1 Low     PT G Codes:        Van Clines, PT  Acute Rehabilitation Services Pager [phone] Office [phone]   Levi Aland 05/02/2017, 2:35 PM

## 2017-05-02 NOTE — Progress Notes (Addendum)
PROGRESS NOTE   Anthony Friedman  LYY:[id number]    DOB: 25-Sep-1950    DOA: 04/30/2017  PCP: Lizbeth Bark, NP   I have briefly reviewed patients previous medical records in [facility].  Brief Narrative:  67 year old male with PMH of rheumatoid arthritis (Dr. Alben Deeds, Rheumatology), COPD, type II DM, HTN, obesity, sleep apnea presented initially to [facility] ED due to acute onset of left wrist pain, swelling.  No history of trauma or cuts to his left hand or wrist.  Denies history of gout.  No history of prior such episodes.  Reports moving close to 300 books over a 4-hour period day prior to symptoms.  X-rays without acute findings.  Patient was transferred to [facility] for further evaluation and management.  Orthopedics consulted.  Getting CT of left wrist to further evaluate.   Assessment & Plan:   Principal Problem:   Cellulitis of left hand Active Problems:   RA (rheumatoid arthritis) (HCC)   COPD gold stage C.   Hypertension   Chronic pain   Diabetes mellitus, type 2 (HCC)   Morbid obesity (HCC)   OSA (obstructive sleep apnea)   1. Acute arthritis of left wrist with?  Cellulitis: Infectious versus inflammatory i.e. septic arthritis versus synovitis related to rheumatoid arthritis.  Empirically started on IV vancomycin, continue.  Continue wrist splint for immobilization.  Pain not adequately controlled, adjust pain medications.  Orthopedics input appreciated, getting CT of left wrist to further evaluate.  Await further recommendations.  Somewhat better compared to initial arrival. 2. Rheumatoid arthritis: Patient denies prior such episodes of acute large joint arthritis.  Methotrexate temporarily held due to concern for infection.  If rules out for infectious etiology, may need steroids.  May have to discuss with his primary rheumatologist. 3. COPD: Stable without clinical bronchospasm. 4. Essential hypertension: Controlled. 5. Diet controlled DM 2: Monitor CBGs  and use SSI if needed. 6. Morbid obesity/Body mass index is 40.61 kg/m. 7. OSA 8. Chronic pain: Judicious adjustment of pain medications.   DVT prophylaxis: Lovenox. Code Status: Full Family Communication: None at bedside Disposition: DC home pending clinical improvement   Consultants:  Orthopedics  Procedures:  Left wrist splint  Antimicrobials:  Vancomycin   Subjective: Ongoing pain and swelling of left wrist, not controlled with current pain medications.  Denies trauma or cuts to his left hand or wrist.  Reports moving close to 300 books from bookshelf and packing them in boxes which took 4 hours, 1 day prior to symptom onset.  ROS: As above.  Objective:  Vitals:   05/01/17 2052 05/02/17 0557 05/02/17 0853 05/02/17 0900  BP: 129/75 138/73  (!) 117/58  Pulse: 90 91  85  Resp: 18 18  14   Temp: 99.7 F (37.6 C) 98.6 F (37 C)  98 F (36.7 C)  TempSrc: Oral Oral  Oral  SpO2: 95% 97% 97% 95%  Weight:      Height:        Examination:  General exam: Pleasant middle-aged male, moderately built and obese, sitting up comfortably in chair. Respiratory system: Clear to auscultation. Respiratory effort normal. Cardiovascular system: S1 & S2 heard, RRR. No JVD, murmurs, rubs, gallops or clicks. No pedal edema. Gastrointestinal system: Abdomen is nondistended, soft and nontender. No organomegaly or masses felt. Normal bowel sounds heard. Central nervous system: Alert and oriented. No focal neurological deficits. Extremities: Symmetric 5 x 5 power.  Moderate pain, swelling, increased warmth and faint erythema of left wrist with  extension some of the swelling to the fingers distally and distal forearm proximally.  No crepitus or fluctuation.  Painful range of movements of wrist and fingers.  Good peripheral pulses felt. Skin: No rashes, lesions or ulcers Psychiatry: Judgement and insight appear normal. Mood & affect appropriate.     Data Reviewed: I have personally  reviewed following labs and imaging studies  CBC: Recent Labs  Lab 04/30/17 2100 05/02/17 0548  WBC 11.3* 11.9*  NEUTROABS 8.8* 8.6*  HGB 12.8* 15.4  HCT 37.5* 46.0  MCV 94.9 98.3  PLT 237 214   Basic Metabolic Panel: Recent Labs  Lab 04/30/17 2100 05/02/17 0548  NA 132* 137  K 4.2 4.1  CL 98* 100*  CO2 24 26  GLUCOSE 115* 79  BUN 18 17  CREATININE 0.80 0.94  CALCIUM 8.9 8.9  MG 1.8  --    Liver Function Tests: Recent Labs  Lab 05/02/17 0548  AST 41  ALT 21  ALKPHOS 77  BILITOT 1.6*  PROT 6.9  ALBUMIN 3.3*    No results found for this or any previous visit (from the past 240 hour(s)).       Radiology Studies: Dg Wrist Complete Left  Result Date: 04/30/2017 CLINICAL DATA:  C/o severe pain w/mild swelling in Lt wrist/hand today getting progressively worse, no injury. Hx RA EXAM: LEFT WRIST - COMPLETE 3+ VIEW COMPARISON:  None. FINDINGS: No fracture or dislocation. There is widening of the scapholunate interval to 5 mm consistent with disruption of the scapholunate ligament. There is narrowing of the radial aspect of the radiocarpal joint. Remaining joints are normally spaced and aligned. No erosions. There is diffuse surrounding soft tissue swelling. Arterial vascular calcifications are noted anteriorly. IMPRESSION: 1. No fracture or dislocation. 2. 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 3. Diffuse surrounding soft tissue swelling. There may be active synovitis. No erosions visualized. Electronically Signed   By: Amie Portland M.D.   On: 04/30/2017 18:38        Scheduled Meds: . aspirin EC  81 mg Oral Daily  . docusate sodium  100 mg Oral BID  . enoxaparin (LOVENOX) injection  40 mg Subcutaneous Q24H  . fluticasone furoate-vilanterol  1 puff Inhalation Daily  . folic acid  1 mg Oral Daily  . gabapentin  300 mg Oral TID  . ketorolac  15 mg Intravenous Q6H  . sodium chloride flush  3 mL Intravenous Q12H  .  tamsulosin  0.4 mg Oral Daily   Continuous Infusions: . sodium chloride 250 mL (05/02/17 1146)  . vancomycin 1,250 mg (05/02/17 1145)     LOS: 1 day     Marcellus Scott, MD, FACP, [facility]. [facility] Pager [phone]  If 7PM-7AM, please contact night-coverage [email or web address] Password [email or web address] 05/02/2017, 12:14 PM

## 2017-05-03 ENCOUNTER — Encounter (HOSPITAL_COMMUNITY)
Admission: EM | Disposition: A | Discharge status: Home / Self Care | Payer: Self-pay | Source: Home / Self Care | Attending: Internal Medicine

## 2017-05-03 ENCOUNTER — Inpatient Hospital Stay (HOSPITAL_COMMUNITY): Payer: Medicare HMO | Admitting: Anesthesiology

## 2017-05-03 ENCOUNTER — Encounter (HOSPITAL_COMMUNITY): Payer: Self-pay | Admitting: Anesthesiology

## 2017-05-03 DIAGNOSIS — L03114 Cellulitis of left upper limb: Secondary | ICD-10-CM

## 2017-05-03 HISTORY — PX: I & D EXTREMITY: SHX5045

## 2017-05-03 LAB — SYNOVIAL CELL COUNT + DIFF, W/ CRYSTALS
LYMPHOCYTES-SYNOVIAL FLD: 1 % (ref 0–20)
MONOCYTE-MACROPHAGE-SYNOVIAL FLUID: 6 % — AB (ref 50–90)
NEUTROPHIL, SYNOVIAL: 93 % — AB (ref 0–25)
WBC, SYNOVIAL: 89000 /mm3 — AB (ref 0–200)

## 2017-05-03 LAB — GLUCOSE, CAPILLARY
Glucose-Capillary: 98 mg/dL (ref 65–99)
Glucose-Capillary: 103 mg/dL — ABNORMAL HIGH (ref 65–99)
Glucose-Capillary: 248 mg/dL — ABNORMAL HIGH (ref 65–99)

## 2017-05-03 LAB — HEMOGLOBIN A1C
HEMOGLOBIN A1C: 5.5 % (ref 4.8–5.6)
Mean Plasma Glucose: 111 mg/dL

## 2017-05-03 LAB — SURGICAL PCR SCREEN
MRSA, PCR: NEGATIVE
Staphylococcus aureus: NEGATIVE

## 2017-05-03 SURGERY — IRRIGATION AND DEBRIDEMENT EXTREMITY
Anesthesia: General | Site: Wrist | Laterality: Left

## 2017-05-03 MED ORDER — VANCOMYCIN HCL 1000 MG IV SOLR
INTRAVENOUS | Status: DC | PRN
  Administered 2017-05-03: 1000 mg

## 2017-05-03 MED ORDER — 0.9 % SODIUM CHLORIDE (POUR BTL) OPTIME
TOPICAL | Status: DC | PRN
Start: 2017-05-03 — End: 2017-05-03
  Administered 2017-05-03: 1000 mL

## 2017-05-03 MED ORDER — ONDANSETRON HCL 4 MG/2ML IJ SOLN
INTRAMUSCULAR | Status: DC | PRN
  Administered 2017-05-03: 4 mg via INTRAVENOUS

## 2017-05-03 MED ORDER — LIDOCAINE 2% (20 MG/ML) 5 ML SYRINGE
INTRAMUSCULAR | Status: AC
  Filled 2017-05-03: qty 5

## 2017-05-03 MED ORDER — PROPOFOL 10 MG/ML IV BOLUS
INTRAVENOUS | Status: AC
  Filled 2017-05-03: qty 20

## 2017-05-03 MED ORDER — VANCOMYCIN HCL 1000 MG IV SOLR
INTRAVENOUS | Status: AC
  Filled 2017-05-03: qty 1000

## 2017-05-03 MED ORDER — HYDROMORPHONE HCL 1 MG/ML IJ SOLN
0.2500 mg | INTRAMUSCULAR | Status: DC | PRN
  Administered 2017-05-03 (×3): 0.5 mg via INTRAVENOUS

## 2017-05-03 MED ORDER — FENTANYL CITRATE (PF) 100 MCG/2ML IJ SOLN
INTRAMUSCULAR | Status: DC | PRN
  Administered 2017-05-03 (×10): 50 ug via INTRAVENOUS

## 2017-05-03 MED ORDER — MIDAZOLAM HCL 5 MG/5ML IJ SOLN
INTRAMUSCULAR | Status: DC | PRN
  Administered 2017-05-03: 2 mg via INTRAVENOUS

## 2017-05-03 MED ORDER — LIDOCAINE HCL (CARDIAC) 20 MG/ML IV SOLN
INTRAVENOUS | Status: DC | PRN
  Administered 2017-05-03: 100 mg via INTRAVENOUS

## 2017-05-03 MED ORDER — HYDROMORPHONE HCL 1 MG/ML IJ SOLN
INTRAMUSCULAR | Status: AC
  Filled 2017-05-03: qty 1

## 2017-05-03 MED ORDER — BACITRACIN ZINC 500 UNIT/GM EX OINT
TOPICAL_OINTMENT | CUTANEOUS | Status: AC
  Filled 2017-05-03: qty 28.35

## 2017-05-03 MED ORDER — DEXTROSE 5 % IV SOLN
3.0000 g | INTRAVENOUS | Status: AC
  Administered 2017-05-03: 3 g via INTRAVENOUS
  Filled 2017-05-03: qty 3

## 2017-05-03 MED ORDER — MEPERIDINE HCL 50 MG/ML IJ SOLN: 6.2500 mg | INTRAMUSCULAR | Status: DC | PRN

## 2017-05-03 MED ORDER — LACTATED RINGERS IV SOLN
INTRAVENOUS | Status: DC
  Administered 2017-05-03: 14:00:00 via INTRAVENOUS

## 2017-05-03 MED ORDER — FENTANYL CITRATE (PF) 250 MCG/5ML IJ SOLN
INTRAMUSCULAR | Status: AC
  Filled 2017-05-03: qty 5

## 2017-05-03 MED ORDER — DEXAMETHASONE SODIUM PHOSPHATE 4 MG/ML IJ SOLN
INTRAMUSCULAR | Status: DC | PRN
  Administered 2017-05-03: 4 mg via INTRAVENOUS

## 2017-05-03 MED ORDER — SODIUM CHLORIDE 0.9 % IR SOLN
Status: DC | PRN
  Administered 2017-05-03: 3000 mL

## 2017-05-03 MED ORDER — TOBRAMYCIN SULFATE 1.2 G IJ SOLR
INTRAMUSCULAR | Status: AC
  Filled 2017-05-03: qty 1.2

## 2017-05-03 MED ORDER — HYDROMORPHONE HCL 1 MG/ML IJ SOLN
INTRAMUSCULAR | Status: AC
  Administered 2017-05-03: 0.5 mg via INTRAVENOUS
  Filled 2017-05-03: qty 1

## 2017-05-03 MED ORDER — ONDANSETRON HCL 4 MG/2ML IJ SOLN: 4.0000 mg | Freq: Once | INTRAMUSCULAR | Status: DC | PRN

## 2017-05-03 MED ORDER — MIDAZOLAM HCL 2 MG/2ML IJ SOLN
INTRAMUSCULAR | Status: AC
  Filled 2017-05-03: qty 2

## 2017-05-03 MED ORDER — BACITRACIN 500 UNIT/GM EX OINT
TOPICAL_OINTMENT | CUTANEOUS | Status: DC | PRN
  Administered 2017-05-03: 1 via TOPICAL

## 2017-05-03 MED ORDER — PROPOFOL 10 MG/ML IV BOLUS
INTRAVENOUS | Status: DC | PRN
  Administered 2017-05-03: 200 mg via INTRAVENOUS

## 2017-05-03 SURGICAL SUPPLY — 52 items
BANDAGE ACE 3X5.8 VEL STRL LF (GAUZE/BANDAGES/DRESSINGS) ×2 IMPLANT
BNDG COHESIVE 4X5 TAN STRL (GAUZE/BANDAGES/DRESSINGS) ×3 IMPLANT
BNDG GAUZE ELAST 4 BULKY (GAUZE/BANDAGES/DRESSINGS) ×6 IMPLANT
BRUSH SCRUB SURG 4.25 DISP (MISCELLANEOUS) ×6 IMPLANT
CHLORAPREP W/TINT 26ML (MISCELLANEOUS) ×3 IMPLANT
COVER MAYO STAND STRL (DRAPES) ×3 IMPLANT
COVER SURGICAL LIGHT HANDLE (MISCELLANEOUS) ×4 IMPLANT
DRAPE ORTHO SPLIT 77X108 STRL (DRAPES) ×3
DRAPE SURG 17X23 STRL (DRAPES) ×3 IMPLANT
DRAPE SURG ORHT 6 SPLT 77X108 (DRAPES) ×1 IMPLANT
DRAPE U-SHAPE 47X51 STRL (DRAPES) ×1 IMPLANT
DRSG ADAPTIC 3X8 NADH LF (GAUZE/BANDAGES/DRESSINGS) ×3 IMPLANT
DRSG EMULSION OIL 3X3 NADH (GAUZE/BANDAGES/DRESSINGS) ×2 IMPLANT
ELECT REM PT RETURN 9FT ADLT (ELECTROSURGICAL)
ELECTRODE REM PT RTRN 9FT ADLT (ELECTROSURGICAL) IMPLANT
EVACUATOR 1/8 PVC DRAIN (DRAIN) IMPLANT
GAUZE SPONGE 4X4 12PLY STRL (GAUZE/BANDAGES/DRESSINGS) ×3 IMPLANT
GLOVE BIO SURGEON STRL SZ7.5 (GLOVE) ×12 IMPLANT
GLOVE BIOGEL PI IND STRL 7.5 (GLOVE) ×1 IMPLANT
GLOVE BIOGEL PI INDICATOR 7.5 (GLOVE) ×2
GOWN STRL REUS W/ TWL LRG LVL3 (GOWN DISPOSABLE) ×2 IMPLANT
GOWN STRL REUS W/TWL LRG LVL3 (GOWN DISPOSABLE) ×6
HANDPIECE INTERPULSE COAX TIP (DISPOSABLE)
KIT BASIN OR (CUSTOM PROCEDURE TRAY) ×3 IMPLANT
KIT ROOM TURNOVER OR (KITS) ×3 IMPLANT
MANIFOLD NEPTUNE II (INSTRUMENTS) ×1 IMPLANT
NS IRRIG 1000ML POUR BTL (IV SOLUTION) ×3 IMPLANT
PACK ORTHO EXTREMITY (CUSTOM PROCEDURE TRAY) ×3 IMPLANT
PAD ARMBOARD 7.5X6 YLW CONV (MISCELLANEOUS) ×6 IMPLANT
PAD CAST 3X4 CTTN HI CHSV (CAST SUPPLIES) IMPLANT
PAD CAST 4YDX4 CTTN HI CHSV (CAST SUPPLIES) IMPLANT
PADDING CAST COTTON 3X4 STRL (CAST SUPPLIES) ×3
PADDING CAST COTTON 4X4 STRL (CAST SUPPLIES) ×3
PADDING CAST COTTON 6X4 STRL (CAST SUPPLIES) ×3 IMPLANT
SET HNDPC FAN SPRY TIP SCT (DISPOSABLE) IMPLANT
SPONGE LAP 18X18 X RAY DECT (DISPOSABLE) ×3 IMPLANT
SUT ETHILON 2 0 FS 18 (SUTURE) ×6 IMPLANT
SUT ETHILON 3 0 PS 1 (SUTURE) ×4 IMPLANT
SUT MON AB 2-0 CT1 36 (SUTURE) ×3 IMPLANT
SUT PDS AB 0 CT 36 (SUTURE) ×2 IMPLANT
SUT VIC AB 0 CT1 27 (SUTURE) ×3
SUT VIC AB 0 CT1 27XBRD ANBCTR (SUTURE) IMPLANT
SUT VIC AB 2-0 CT1 27 (SUTURE) ×3
SUT VIC AB 2-0 CT1 TAPERPNT 27 (SUTURE) IMPLANT
SWAB CULTURE ESWAB REG 1ML (MISCELLANEOUS) IMPLANT
TOWEL OR 17X24 6PK STRL BLUE (TOWEL DISPOSABLE) ×1 IMPLANT
TOWEL OR 17X26 10 PK STRL BLUE (TOWEL DISPOSABLE) ×4 IMPLANT
TUBE CONNECTING 12'X1/4 (SUCTIONS) ×1
TUBE CONNECTING 12X1/4 (SUCTIONS) ×2 IMPLANT
UNDERPAD 30X30 (UNDERPADS AND DIAPERS) ×3 IMPLANT
WATER STERILE IRR 1000ML POUR (IV SOLUTION) ×1 IMPLANT
YANKAUER SUCT BULB TIP NO VENT (SUCTIONS) ×3 IMPLANT

## 2017-05-03 NOTE — Anesthesia Preprocedure Evaluation (Addendum)
Anesthesia Evaluation  Patient identified by MRN, date of birth, ID band Patient awake    Reviewed: Allergy & Precautions, NPO status , Patient's Chart, lab work & pertinent test results  History of Anesthesia Complications (+) Family history of anesthesia reaction  Airway Mallampati: III  TM Distance: >3 FB Neck ROM: Full    Dental no notable dental hx. (+) Teeth Intact, Missing, Dental Advisory Given   Pulmonary sleep apnea , pneumonia, resolved, COPD,  COPD inhaler, Current Smoker,  Uses dental appliance for OSA   Pulmonary exam normal breath sounds clear to auscultation       Cardiovascular hypertension, + Peripheral Vascular Disease  Normal cardiovascular exam Rhythm:Regular Rate:Normal     Neuro/Psych Anxiety Peripheral neuropathy  Neuromuscular disease    GI/Hepatic Neg liver ROS, S/P gastric sleeve resection   Endo/Other  diabetes, Well Controlled, Type obesity  Renal/GU Renal disease   BPH    Musculoskeletal  (+) Arthritis , Rheumatoid disorders,  Infected left wrist Chronic LBP Spinal cord stimulator Hx/o spondylolisthesis Lumbar spine   Abdominal (+) + obese,   Peds  Hematology   Anesthesia Other Findings   Reproductive/Obstetrics                          Anesthesia Physical Anesthesia Plan  ASA: III  Anesthesia Plan: General   Post-op Pain Management:    Induction: Intravenous  PONV Risk Score and Plan: Ondansetron, Treatment may vary due to age or medical condition and Midazolam  Airway Management Planned: LMA  Additional Equipment:   Intra-op Plan:   Post-operative Plan: Extubation in OR  Informed Consent: I have reviewed the patients History and Physical, chart, labs and discussed the procedure including the risks, benefits and alternatives for the proposed anesthesia with the patient or authorized representative who has indicated his/her  understanding and acceptance.   Dental advisory given  Plan Discussed with: Anesthesiologist, CRNA and Surgeon  Anesthesia Plan Comments:         Anesthesia Quick Evaluation

## 2017-05-03 NOTE — Transfer of Care (Signed)
Immediate Anesthesia Transfer of Care Note  Patient: Anthony Friedman  Procedure(s) Performed: IRRIGATION AND DEBRIDEMENT EXTREMITY (Left Wrist)  Patient Location: PACU  Anesthesia Type:General  Level of Consciousness: awake, oriented and patient cooperative  Airway & Oxygen Therapy: Patient Spontanous Breathing and Patient connected to nasal cannula oxygen  Post-op Assessment: Report given to RN and Post -op Vital signs reviewed and stable  Post vital signs: Reviewed  Last Vitals:  Vitals:   05/03/17 0413 05/03/17 1301  BP: (!) 119/57 (!) 179/36  Pulse: 69 64  Resp: 15 18  Temp: 36.6 C 36.7 C  SpO2: 98% 100%    Last Pain:  Vitals:   05/03/17 1301  TempSrc: Oral  PainSc:       Patients Stated Pain Goal: 2 (05/02/17 1643)  Complications: No apparent anesthesia complications

## 2017-05-03 NOTE — Progress Notes (Signed)
PROGRESS NOTE   Anthony Friedman  KYH:[id number]    DOB: 08-12-1950    DOA: 04/30/2017  PCP: Ilona Sorrel, NP   I have briefly reviewed patients previous medical records in [facility].  Brief Narrative:  67 year old male with PMH of rheumatoid arthritis (Dr. Alben Deeds, Rheumatology), COPD, type II DM, HTN, obesity, sleep apnea presented initially to [facility] ED due to acute onset of left wrist pain, swelling.  No history of trauma or cuts to his left hand or wrist.  Denies history of gout.  No history of prior such episodes.  Reports moving close to 300 books over a 4-hour period day prior to symptoms.  X-rays without acute findings.  Patient was transferred to [facility] for further evaluation and management.  Orthopedics consulted, CT obtained, wrist aspirated and concern for septic arthritis complicating CPPD/pseudogout, taken to OR for I&D 2/27.     Assessment & Plan:   Principal Problem:   Cellulitis of left hand Active Problems:   RA (rheumatoid arthritis) (HCC)   COPD gold stage C.   Hypertension   Chronic pain   Diabetes mellitus, type 2 (HCC)   Morbid obesity (HCC)   OSA (obstructive sleep apnea)   1. Acute arthritis of left wrist with?  Cellulitis: Infectious versus inflammatory i.e. septic arthritis versus synovitis related to rheumatoid arthritis.  Empirically started on IV vancomycin, continue.  Pain better controlled today.  CT left wrist results appreciated, could be related to RA although septic arthritis cannot be ruled out.  Orthopedics aspirated left wrist 2/27 which shows 90 K WBCs, predominantly neutrophils and CPPD crystals.  Taking to OR for I&D 2/27.  Follow wrist aspirate culture and sensitivity results. 2. ?  Acute pseudogout of left wrist: Please see discussion above. 3. Rheumatoid arthritis: Patient denies prior such episodes of acute large joint arthritis.  Methotrexate temporarily held due to concern for infection.  If rules out for infectious  etiology, may need steroids.  May have to discuss with his primary rheumatologist. 4. COPD: Stable without clinical bronchospasm. 5. Essential hypertension: Mildly uncontrolled at times, possibly due to pain.  Continue current regimen. 6. Diet controlled DM 2: Monitor CBGs and use SSI if needed. 7. Morbid obesity/Body mass index is 40.59 kg/m. 8. OSA 9. Chronic pain: Judicious adjustment of pain medications.   DVT prophylaxis: Lovenox. Code Status: Full Family Communication: Discussed in detail with spouse at bedside. Disposition: DC home pending clinical improvement   Consultants:  Orthopedics  Procedures:  Left wrist splint  Antimicrobials:  Vancomycin   Subjective: Patient was seen this morning prior to procedure.  Some improvement in pain and swelling of his left wrist and fingers and able to move fingers a little better.  ROS: As above.  Objective:  Vitals:   05/02/17 2007 05/03/17 0413 05/03/17 1301 05/03/17 1318  BP: (!) 145/47 (!) 119/57 (!) 179/36   Pulse: 75 69 64   Resp: 15 15 18    Temp: 98 F (36.7 C) 97.8 F (36.6 C) 98 F (36.7 C)   TempSrc: Oral Oral Oral   SpO2: 95% 98% 100%   Weight:    124.7 kg (274 lb 16 oz)  Height:    5' 9.02" (1.753 m)    Examination:  General exam: Pleasant middle-aged male, moderately built and obese, sitting up comfortably in chair. Respiratory system: Clear to auscultation. Respiratory effort normal. Cardiovascular system: S1 & S2 heard, RRR. No JVD, murmurs, rubs, gallops or clicks. No pedal edema. Gastrointestinal  system: Abdomen is nondistended, soft and nontender. No organomegaly or masses felt. Normal bowel sounds heard. Central nervous system: Alert and oriented. No focal neurological deficits. Extremities: Symmetric 5 x 5 power.  Moderate tenderness, swelling, increased warmth and faint erythema of left wrist with extension some of the swelling to the fingers distally and distal forearm proximally.  No crepitus  or fluctuation.  Painful range of movements of wrist and fingers.  Good peripheral pulses felt.  All findings are slightly improved compared to 2/26. Skin: No rashes, lesions or ulcers Psychiatry: Judgement and insight appear normal. Mood & affect appropriate.     Data Reviewed: I have personally reviewed following labs and imaging studies  CBC: Recent Labs  Lab 04/30/17 2100 05/02/17 0548  WBC 11.3* 11.9*  NEUTROABS 8.8* 8.6*  HGB 12.8* 15.4  HCT 37.5* 46.0  MCV 94.9 98.3  PLT 237 214   Basic Metabolic Panel: Recent Labs  Lab 04/30/17 2100 05/02/17 0548  NA 132* 137  K 4.2 4.1  CL 98* 100*  CO2 24 26  GLUCOSE 115* 79  BUN 18 17  CREATININE 0.80 0.94  CALCIUM 8.9 8.9  MG 1.8  --    Liver Function Tests: Recent Labs  Lab 05/02/17 0548  AST 41  ALT 21  ALKPHOS 77  BILITOT 1.6*  PROT 6.9  ALBUMIN 3.3*    No results found for this or any previous visit (from the past 240 hour(s)).       Radiology Studies: Ct Wrist Left W Contrast  Result Date: 05/02/2017 CLINICAL DATA:  Acute onset left wrist pain and swelling a week ago. No injury. History of rheumatoid arthritis. EXAM: CT OF THE UPPER LEFT EXTREMITY WITH CONTRAST TECHNIQUE: Multidetector CT imaging of the upper left extremity was performed according to the standard protocol following intravenous contrast administration. COMPARISON:  Left wrist x-rays dated April 30, 2017. CONTRAST:  ISOVUE-300 IOPAMIDOL (ISOVUE-300) INJECTION 61% FINDINGS: Bones/Joint/Cartilage No acute fracture or dislocation. Unchanged widening of the scapholunate interval. There are few small erosions involving the carpal bones, as well as the second metacarpal head and distal radius. Small marginal osteophytes at the first [facility] joint. Bone mineralization is normal. Small radiocarpal joint effusion with mild synovitis. Ligaments Suboptimally assessed by CT. Muscles and Tendons Grossly unremarkable. Soft tissues No drainable fluid  collection. Mild dorsal and volar soft tissue swelling about the wrist. IMPRESSION: 1. Small radiocarpal joint effusion with mild synovitis and a few scattered erosions involving the carpal bones, distal radius, and second MCP joint. Findings are favored to reflect rheumatoid arthritis, although septic arthritis would have a similar appearance. Correlate with inflammatory markers and consider wrist aspiration to exclude infection. 2. Mild dorsal and volar wrist soft tissue swelling. No drainable fluid collection. 3. Unchanged scapholunate ligament disruption. Electronically Signed   By: Obie Dredge M.D.   On: 05/02/2017 17:36        Scheduled Meds: . [MAR Hold] aspirin EC  81 mg Oral Daily  . [MAR Hold] docusate sodium  100 mg Oral BID  . [MAR Hold] enoxaparin (LOVENOX) injection  40 mg Subcutaneous Q24H  . [MAR Hold] fluticasone furoate-vilanterol  1 puff Inhalation Daily  . [MAR Hold] folic acid  1 mg Oral Daily  . [MAR Hold] gabapentin  300 mg Oral TID  . [MAR Hold] ketorolac  15 mg Intravenous Q6H  . [MAR Hold] sodium chloride flush  3 mL Intravenous Q12H  . [MAR Hold] tamsulosin  0.4 mg Oral Daily   Continuous  Infusions: . [MAR Hold] sodium chloride 250 mL (05/02/17 1146)  . lactated ringers 10 mL/hr at 05/03/17 1334  . [MAR Hold] vancomycin Stopped (05/03/17 1010)     LOS: 2 days     Marcellus Scott, MD, FACP, [facility]. [facility] Pager [phone]  If 7PM-7AM, please contact night-coverage [email or web address] Password [email or web address] 05/03/2017, 1:42 PM

## 2017-05-03 NOTE — Anesthesia Procedure Notes (Signed)
Procedure Name: LMA Insertion Date/Time: 05/03/2017 2:05 PM Performed by: Lovie Chol, CRNA Pre-anesthesia Checklist: Patient identified, Emergency Drugs available, Suction available and Patient being monitored Patient Re-evaluated:Patient Re-evaluated prior to induction Oxygen Delivery Method: Circle System Utilized Preoxygenation: Pre-oxygenation with 100% oxygen Induction Type: IV induction Ventilation: Mask ventilation without difficulty LMA: LMA inserted LMA Size: 4.0 Number of attempts: 1 Airway Equipment and Method: Bite block Placement Confirmation: positive ETCO2 and breath sounds checked- equal and bilateral Tube secured with: Tape Dental Injury: Teeth and Oropharynx as per pre-operative assessment

## 2017-05-03 NOTE — Op Note (Signed)
OrthopaedicSurgeryOperativeNote [phone]) Date of Surgery: 05/03/2017  Admit Date: 04/30/2017   Diagnoses: Pre-Op Diagnoses: Left wrist septic arthritis  Post-Op Diagnosis: Same  Procedures: CPT 25040-I&D of left wrist  Surgeons: Primary: Zachry Hopfensperger, Gillie Manners, MD   Location:[facility] OR ROOM 03   AnesthesiaGeneral   Antibiotics:Ancef 3gm after cultures were obtained  Tourniquettime: Total Tourniquet Time Documented: Upper Arm (Left) - 24 minutes Total: Upper Arm (Left) - 24 minutes  EstimatedBloodLoss:None  Complications:None  Specimens: ID Type Source Tests Collected by Time Destination  A : LEFT WRIST CULTURE Tissue Soft Tissue, Other AEROBIC/ANAEROBIC CULTURE (SURGICAL/DEEP WOUND) Roby Lofts, MD 05/03/2017 1422    Implants: None  IndicationsforSurgery: This is an individual who had acute onset of left wrist pain.  He presented to an outside emergency room and subsequently was transferred here after 24 hours for evaluation.  His presentation was unusual as he had pain with motion of his fingers.  I obtained a Zenaida Niece study which was a CT scan since he cannot have an MRI.  It did not show any focal fluid collections but there was a small effusion.  I then subsequently aspirated this joint and sent that to the lab.  It returned at 89,000 cells with pseudogout crystals.  I felt that it was needed for a I&D.  I discussed risks and benefits with the patient.  He agreed to proceed and consent was obtained.  Operative Findings: Dorsal arthrotomy with murky fluid encountered, sent for culture  Procedure: The patient was identified in the preoperative holding area. Consent was confirmed with the patient and their family and all questions were answered. The operative extremity was marked after confirmation with the patient. he was then brought back to the operating room by our anesthesia colleagues. He was transferred to a regular OR table. He was placed under general  anesthesia. The operative extremity was then prepped and draped in usual sterile fashion. A preoperative timeout was performed to verify the patient, the procedure, and the extremity.  The tourniquet was inflated to . A dorsal approach was made to the wrist. It was taken through skin and subcutaneous tissue. The extensor retinaculum was incised between the 3rd and 4th dorsal compartments. The 4th dorsal compartment was retracted ulnarly. The dorsal capsule was then incised and synovium herniated through the arthrotomy. I used a tenotomy to open up the capsule further and encountered murky fluid and sent this for culture. I then irrigated the wrist with 3L of normal saline with cysto tubing. One gram of vancomycin powder was placed in the wound. The retinaculum was closed with 0 PDS. The skin closed with 2-0 monocryl and 3-0 nylon. Sterile dressing placed and splint placed. He was awoken from anesthesia and taken to PACU in stable condition.  Post Op Plan/Instructions: Nonweightbearing to left arm. Postoperative antibiotics. Follow up cultures and narrow coverage.  I was present and performed the entire surgery.  Truitt Merle, MD [facility]

## 2017-05-03 NOTE — Progress Notes (Signed)
Called labs for results of cell count. Stated it was 89,000 WBC. Will plan for I&D of wrist this afternoon. Discussed with patient and he agrees to proceed.  Roby Lofts, MD [facility] [phone] (phone)

## 2017-05-03 NOTE — Progress Notes (Signed)
Orthopaedic Trauma Progress Note  S: Patient continues to have pain, slightly better  O: LUE: wrist still slightly erythematous and painful with motion  Imaging: CT scan reviewed, no fluid collections, small effusion  Labs:  CBC    Component Value Date/Time   WBC 11.9 (H) 05/02/2017 0548   RBC 4.68 05/02/2017 0548   HGB 15.4 05/02/2017 0548   HCT 46.0 05/02/2017 0548   PLT 214 05/02/2017 0548   MCV 98.3 05/02/2017 0548   MCH 32.9 05/02/2017 0548   MCHC 33.5 05/02/2017 0548   RDW 15.4 05/02/2017 0548   LYMPHSABS 1.8 05/02/2017 0548   MONOABS 1.2 (H) 05/02/2017 0548   EOSABS 0.3 05/02/2017 0548   BASOSABS 0.0 05/02/2017 0548    A/P: Left wrist pain and possible septic wrist vs synovitis  -Aspiration performed this AM -Await results, keep NPO -Possible I&D if returns with elevated WBC or positive gram stain  Roby Lofts, MD [facility] [phone] (phone)

## 2017-05-03 NOTE — Progress Notes (Signed)
Patient's CBG tonight is 33- he asked that the doctors not be notified because he just ate some cookies and believes it is a "fluk" reading. He asked that he is rechecked in the morning and to go from there. Patient is stable and alert- will continue to monitor

## 2017-05-03 NOTE — Progress Notes (Signed)
OT NOTE  OT meeting with patient and wife this Am regarding hand edema, positioning, splint and AROM/ AAROM of the hand wrist. Pt demonstrates doff of sling mod I. Pt educated with handouts on fine motor and gross motor movements. Pt educated on self ROM of digits and time outside of the splint. Pt with Carter arm foam block present and educated on use in chair to help with AROM movement of hand while watching TV. Pt with decr in edema noted . Wife states "wow your hand has gone down a lot". OT to continue to follow based on MD recommendations.     Mateo Flow   OTR/L Pager: [phone] Office: [phone] .

## 2017-05-03 NOTE — Progress Notes (Addendum)
Physical Therapy Treatment and Discharge Note  Patient is making good progress with PT.  From a mobility standpoint anticipate patient will be ready for DC home when medically ready. Recommend pt ambulate hallway multiple time daily. Goals met and education completed. PT will sign off at this time.     05/03/17 1341  PT Visit Information  Last PT Received On 05/03/17  Assistance Needed +1  History of Present Illness Admitted with L wrist pain, wrist splint ordered;  has a pertinent past medical history of Atelectasis, Bradycardia, , Chronic venous insufficiency, COPD (chronic obstructive pulmonary disease) ([place]),  Diabetes mellitus without complication ([place]), Edema, Elevated liver enzymes, Leg pain, Liver enlargement, Obesity, Pallor, Pneumonia, RA (rheumatoid arthritis) ([place]), Sleep apnea, SOBOE (shortness of breath on exertion), Superficial thrombophlebitis, Urinary frequency, Urinary tract bacterial infections, and Venous stasis. Bil TKAs, Bil hip surgeries, back surgery, spinal cord stimulator  Restrictions  Other Position/Activity Restrictions WBAT at this time no orders for restrictions  Pain Assessment  Pain Assessment No/denies pain  Cognition  Arousal/Alertness Awake/alert  Behavior During Therapy WFL for tasks assessed/performed  Overall Cognitive Status Within Functional Limits for tasks assessed  Bed Mobility  Overal bed mobility Independent  Transfers  Overall transfer level Independent  Equipment used None  Transfers Sit to/from Stand  Ambulation/Gait  Ambulation/Gait assistance Modified independent (Device/Increase time)  Ambulation Distance (Feet) 400 Feet  Assistive device Straight cane  Gait Pattern/deviations Step-through pattern  General Gait Details safe use of SPC and no deviations noted with high level balance activities  PT - End of Session  Equipment Utilized During Treatment Gait belt  Activity Tolerance Patient tolerated treatment well  Patient left in  chair;with call bell/phone within reach;with family/visitor present  Nurse Communication Mobility status  PT - Assessment/Plan  PT Plan Current plan remains appropriate  PT Visit Diagnosis Unsteadiness on feet (R26.81);Other abnormalities of gait and mobility (R26.89)  PT Frequency (ACUTE ONLY) Min 3X/week  Recommendations for Other Services OT consult (as ordered)  Follow Up Recommendations No PT follow up  PT equipment None recommended by PT  AM-PAC PT "6 Clicks" Daily Activity Outcome Measure  Difficulty turning over in bed (including adjusting bedclothes, sheets and blankets)? 4  Difficulty moving from lying on back to sitting on the side of the bed?  4  Difficulty sitting down on and standing up from a chair with arms (e.g., wheelchair, bedside commode, etc,.)? 4  Help needed moving to and from a bed to chair (including a wheelchair)? 4  Help needed walking in hospital room? 4  Help needed climbing 3-5 steps with a railing?  4  6 Click Score 24  Mobility G Code  CH  PT Goal Progression  Progress towards PT goals Goals met/education completed, patient discharged from PT  Acute Rehab PT Goals  PT Goal Formulation With patient  Time For Goal Achievement 05/09/17  Potential to Achieve Goals Good  PT Time Calculation  PT Start Time (ACUTE ONLY) 1205  PT Stop Time (ACUTE ONLY) 1215  PT Time Calculation (min) (ACUTE ONLY) 10 min  PT General Charges  $$ ACUTE PT VISIT 1 Visit  PT Treatments  $Gait Training 8-22 mins   Earney Navy, PTA Pager: [phone]

## 2017-05-04 ENCOUNTER — Encounter (HOSPITAL_COMMUNITY): Payer: Self-pay | Admitting: Student

## 2017-05-04 LAB — BASIC METABOLIC PANEL
Anion gap: 10 (ref 5–15)
BUN: 21 mg/dL — ABNORMAL HIGH (ref 6–20)
CO2: 26 mmol/L (ref 22–32)
CREATININE: 0.85 mg/dL (ref 0.61–1.24)
Calcium: 8.7 mg/dL — ABNORMAL LOW (ref 8.9–10.3)
Chloride: 101 mmol/L (ref 101–111)
GFR calc non Af Amer: >60 mL/min (ref >60)
GLUCOSE: 111 mg/dL — AB (ref 65–99)
Potassium: 5.1 mmol/L (ref 3.5–5.1)
Sodium: 137 mmol/L (ref 135–145)

## 2017-05-04 LAB — CBC
HCT: 36.2 % — ABNORMAL LOW (ref 39.0–52.0)
HEMOGLOBIN: 12.8 g/dL — AB (ref 13.0–17.0)
MCH: 35.3 pg — AB (ref 26.0–34.0)
MCHC: 35.4 g/dL (ref 30.0–36.0)
MCV: 99.7 fL (ref 78.0–100.0)
PLATELETS: 247 10*3/uL (ref 150–400)
RBC: 3.63 MIL/uL — AB (ref 4.22–5.81)
RDW: 15 % (ref 11.5–15.5)
WBC: 11.6 10*3/uL — ABNORMAL HIGH (ref 4.0–10.5)

## 2017-05-04 LAB — GLUCOSE, CAPILLARY: Glucose-Capillary: 111 mg/dL — ABNORMAL HIGH (ref 65–99)

## 2017-05-04 MED ORDER — OXYCODONE-ACETAMINOPHEN 10-325 MG PO TABS: 1.0000 | ORAL_TABLET | Freq: Four times a day (QID) | ORAL | 0 refills | Status: AC | PRN

## 2017-05-04 MED ORDER — DOXYCYCLINE HYCLATE 100 MG PO TABS
100.0000 mg | ORAL_TABLET | Freq: Two times a day (BID) | ORAL | Status: DC
  Administered 2017-05-04: 100 mg via ORAL
  Filled 2017-05-04: qty 1

## 2017-05-04 MED ORDER — ASPIRIN EC 81 MG PO TBEC: 81.0000 mg | DELAYED_RELEASE_TABLET | Freq: Every day | ORAL | Status: AC

## 2017-05-04 MED ORDER — DOXYCYCLINE HYCLATE 100 MG PO TABS: 100.0000 mg | ORAL_TABLET | Freq: Two times a day (BID) | ORAL | 0 refills | Status: DC

## 2017-05-04 NOTE — Progress Notes (Signed)
Orthopedic Tech Progress Note Patient Details:  Anthony Friedman 1950-12-15 [id number]  Ortho Devices Type of Ortho Device: Shoulder immobilizer Ortho Device/Splint Location: lue Ortho Device/Splint Interventions: Application   Post Interventions Patient Tolerated: Well Instructions Provided: Care of device   [name] 05/04/2017, 2:46 PM

## 2017-05-04 NOTE — Anesthesia Postprocedure Evaluation (Signed)
Anesthesia Post Note  Patient: Anthony Friedman  Procedure(s) Performed: IRRIGATION AND DEBRIDEMENT EXTREMITY (Left Wrist)     Patient location during evaluation: PACU Anesthesia Type: General Level of consciousness: awake and alert and oriented Pain management: pain level controlled Vital Signs Assessment: post-procedure vital signs reviewed and stable Respiratory status: spontaneous breathing, nonlabored ventilation, respiratory function stable and patient connected to nasal cannula oxygen Cardiovascular status: blood pressure returned to baseline and stable Postop Assessment: no apparent nausea or vomiting Anesthetic complications: no                  Demitrios Molyneux A.

## 2017-05-04 NOTE — Discharge Summary (Addendum)
Physician Discharge Summary  Rook Soule WEX:[id number] DOB: Jun 17, 1950  PCP: Ilona Sorrel, NP  Admit date: 04/30/2017 Discharge date: 05/04/2017  Recommendations for Outpatient Follow-up:  1. Amy Leslee Home, NP/PCP in 5 days with repeat labs (CBC & BMP).  Please follow final left wrist aspirate cultures that were sent from the hospital 05/03/17. 2. Dr. Caryn Bee Haddix, Orthopedics in 1 week. 3. Dr. Alben Deeds, Rheumatology in 1 week. 4. Methotrexate on hold due to concern for infectious etiology.  Resume outpatient when deemed appropriate.  Home Health: None Equipment/Devices: Left wrist splint, keep until outpatient follow-up with orthopedics.  Discharge Condition: Improved and stable CODE STATUS: Full Diet recommendation: Heart healthy diet.  Discharge Diagnoses:  Principal Problem:   Cellulitis of left hand Active Problems:   RA (rheumatoid arthritis) (HCC)   COPD gold stage C.   Hypertension   Chronic pain   Diabetes mellitus, type 2 (HCC)   Morbid obesity (HCC)   OSA (obstructive sleep apnea)   Brief Summary: 67 year old male with PMH of rheumatoid arthritis (Dr. Alben Deeds, Rheumatology), COPD, type II DM, HTN, obesity, sleep apnea presented initially to [facility] ED due to acute onset of left wrist pain, swelling.  No history of trauma or cuts to his left hand or wrist.  Denies history of gout.  No history of prior such episodes.  Reports moving close to 300 books over a 4-hour period day prior to symptoms.  X-rays without acute findings.  Patient was transferred to [facility] for further evaluation and management.  Orthopedics consulted, CT obtained, wrist aspirated and concern for septic arthritis complicating CPPD/pseudogout, taken to OR for I&D 2/27.     Assessment & Plan:   1. Acute arthritis of left wrist with?  Cellulitis: Infectious versus inflammatory i.e. septic arthritis versus synovitis related to rheumatoid arthritis.  Empirically started on IV  vancomycin and completed approximately 4 days of same. CT left wrist results as below appreciated, could be related to RA although septic arthritis cannot be ruled out.  Orthopedics aspirated left wrist 2/27 which shows 90 K WBCs, predominantly neutrophils and CPPD crystals, culture negative to date after 1 day.  Underwent I&D of left wrist on 2/27 for suspected septic arthritis.  Improved.  As advised by Dr. Jena Gauss, Orthopedics, patient cleared for discharge home on oral antibiotics to complete total 7-10 days course, he will follow final culture results and adjust antibiotics if needed, continue left wrist splint until outpatient follow-up with him, nonweightbearing on left wrist .  I discussed with Infectious Disease MD on call who recommended oral doxycycline with meals. 2. ?  Acute pseudogout of left wrist: Please see discussion above. 3. Rheumatoid arthritis: Patient denies prior such episodes of acute large joint arthritis.  Methotrexate temporarily held due to concern for infection.  Outpatient follow-up with rheumatology and resume methotrexate when felt appropriate. 4. COPD: Stable without clinical bronchospasm. 5. Essential hypertension: Mildly uncontrolled at times, possibly due to pain.  Continue current regimen. 6. Diet controlled DM 2:  Intermittent mildly uncontrolled.  Outpatient follow-up.  A1c 5.5 suggests good outpatient control. 7. Morbid obesity/Body mass index is 40.59 kg/m. 8. OSA 9. Chronic pain: Stable.   Consultants:  Orthopedics  Procedures:  Left wrist splint Left wrist aspiration 2/27 Left wrist I&D 2/27.   Discharge Instructions     Medication List    STOP taking these medications   HYDROcodone-acetaminophen 10-325 MG tablet Commonly known as:  NORCO   methotrexate 2.5 MG tablet Commonly known as:  RHEUMATREX   oxyCODONE 5 MG immediate release tablet Commonly known as:  Oxy IR/ROXICODONE   Oxycodone HCl 10 MG Tabs     TAKE these medications    aspirin EC 81 MG tablet Take 1 tablet (81 mg total) by mouth daily.   B-12 500 MCG Subl Place 500 mcg under the tongue See admin instructions. Take one tablet (500 mcg) sublingually twice weekly - Monday and Thursday   CALCIUM 600 + D PO Take 1 tablet by mouth 3 (three) times daily.   doxycycline 100 MG tablet Commonly known as:  VIBRA-TABS Take 1 tablet (100 mg total) by mouth 2 (two) times daily with a meal.   FLOMAX 0.4 MG Caps capsule Generic drug:  tamsulosin Take 0.4 mg by mouth daily.   fluticasone furoate-vilanterol 100-25 MCG/INH Aepb Commonly known as:  BREO ELLIPTA Inhale 1 puff into the lungs daily.   folic acid 400 MCG tablet Commonly known as:  FOLVITE Take 800 mcg by mouth daily.   gabapentin 300 MG capsule Commonly known as:  NEURONTIN Take 300 mg by mouth 3 (three) times daily.   multivitamin with minerals Tabs tablet Take 1 tablet by mouth 2 (two) times daily.   oxyCODONE-acetaminophen 10-325 MG tablet Commonly known as:  PERCOCET Take 1 tablet by mouth every 6 (six) hours as needed for pain.   PROAIR HFA 108 (90 Base) MCG/ACT inhaler Generic drug:  albuterol Inhale 2 puffs into the lungs every 6 (six) hours as needed for wheezing.   rOPINIRole 1 MG tablet Commonly known as:  REQUIP Take 1 mg by mouth 3 (three) times daily as needed (restless legs).      Follow-up Information    Haddix, Gillie Manners, MD. Schedule an appointment as soon as possible for a visit in 1 week(s).   Specialty:  Orthopedic Surgery Contact information: [address] [place] [address] [phone]        Ilona Sorrel, NP. Schedule an appointment as soon as possible for a visit in 5 day(s).   Specialty:  Infectious Diseases Why:  To be seen with repeat labs (CBC & BMP). Contact information: [address] [place] [address] [phone]        Donnetta Hail, MD. Schedule an appointment as soon as possible for a visit in 1  week(s).   Specialty:  Rheumatology Contact information: [address] [place] [address] [phone]          Allergies  Allergen Reactions  . Atenolol Other (See Comments)    Lowers heart rate  . Sulfa Antibiotics Other (See Comments)    Blood in urine       Procedures/Studies: Dg Wrist Complete Left  Result Date: 04/30/2017 CLINICAL DATA:  C/o severe pain w/mild swelling in Lt wrist/hand today getting progressively worse, no injury. Hx RA EXAM: LEFT WRIST - COMPLETE 3+ VIEW COMPARISON:  None. FINDINGS: No fracture or dislocation. There is widening of the scapholunate interval to 5 mm consistent with disruption of the scapholunate ligament. There is narrowing of the radial aspect of the radiocarpal joint. Remaining joints are normally spaced and aligned. No erosions. There is diffuse surrounding soft tissue swelling. Arterial vascular calcifications are noted anteriorly. IMPRESSION: 1. No fracture or dislocation. 2. 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 3. Diffuse surrounding soft tissue swelling. There may be active synovitis. No erosions visualized. Electronically Signed   By: Renard Hamper.D.  On: 04/30/2017 18:38   Ct Wrist Left W Contrast  Result Date: 05/02/2017 CLINICAL DATA:  Acute onset left wrist pain and swelling a week ago. No injury. History of rheumatoid arthritis. EXAM: CT OF THE UPPER LEFT EXTREMITY WITH CONTRAST TECHNIQUE: Multidetector CT imaging of the upper left extremity was performed according to the standard protocol following intravenous contrast administration. COMPARISON:  Left wrist x-rays dated April 30, 2017. CONTRAST:  ISOVUE-300 IOPAMIDOL (ISOVUE-300) INJECTION 61% FINDINGS: Bones/Joint/Cartilage No acute fracture or dislocation. Unchanged widening of the scapholunate interval. There are few small erosions involving the carpal bones, as well as the second metacarpal head  and distal radius. Small marginal osteophytes at the first [facility] joint. Bone mineralization is normal. Small radiocarpal joint effusion with mild synovitis. Ligaments Suboptimally assessed by CT. Muscles and Tendons Grossly unremarkable. Soft tissues No drainable fluid collection. Mild dorsal and volar soft tissue swelling about the wrist. IMPRESSION: 1. Small radiocarpal joint effusion with mild synovitis and a few scattered erosions involving the carpal bones, distal radius, and second MCP joint. Findings are favored to reflect rheumatoid arthritis, although septic arthritis would have a similar appearance. Correlate with inflammatory markers and consider wrist aspiration to exclude infection. 2. Mild dorsal and volar wrist soft tissue swelling. No drainable fluid collection. 3. Unchanged scapholunate ligament disruption. Electronically Signed   By: Obie Dredge M.D.   On: 05/02/2017 17:36      Subjective: Pain, swelling of left wrist better compared to yesterday.  Discharge Exam:  Vitals:   05/03/17 1957 05/03/17 2200 05/04/17 0521 05/04/17 0921  BP: (!) 175/93 (!) 132/58 110/66   Pulse: 97  64 72  Resp: 15  17 18   Temp: 98.5 F (36.9 C)  97.6 F (36.4 C)   TempSrc: Oral  Oral   SpO2: 91%  100% 94%  Weight:      Height:        General exam: Pleasant middle-aged male, moderately built and obese, sitting up comfortably in chair. Respiratory system: Clear to auscultation. Respiratory effort normal. Cardiovascular system: S1 & S2 heard, RRR. No JVD, murmurs, rubs, gallops or clicks. No pedal edema. Gastrointestinal system: Abdomen is nondistended, soft and nontender. No organomegaly or masses felt. Normal bowel sounds heard. Central nervous system: Alert and oriented. No focal neurological deficits. Extremities: Symmetric 5 x 5 power.  Left wrist postop dressing clean and dry and in a splint.  Visible distal fingers with minimal swelling and able to move them better. Skin: No rashes,  lesions or ulcers Psychiatry: Judgement and insight appear normal. Mood & affect appropriate.      The results of significant diagnostics from this hospitalization (including imaging, microbiology, ancillary and laboratory) are listed below for reference.     Microbiology: Recent Results (from the past 240 hour(s))  Body fluid culture     Status: None (Preliminary result)   Collection Time: 05/03/17  7:41 AM  Result Value Ref Range Status   Specimen Description SYNOVIAL LEFT WRIST  Final   Special Requests NONE  Final   Gram Stain   Final    FEW WBC PRESENT, PREDOMINANTLY PMN NO ORGANISMS SEEN    Culture   Final    NO GROWTH 1 DAY Performed at [facility], [address] [place], [place] [address]    Report Status PENDING  Incomplete  Surgical pcr screen     Status: None   Collection Time: 05/03/17 12:56 PM  Result Value Ref Range Status   MRSA, PCR  NEGATIVE NEGATIVE Final   Staphylococcus aureus NEGATIVE NEGATIVE Final    Comment: (NOTE) The Xpert SA Assay (FDA approved for NASAL specimens in patients 17 years of age and older), is one component of a comprehensive surveillance program. It is not intended to diagnose infection nor to guide or monitor treatment. Performed at [facility], [address], [place], [place] [address]   Aerobic/Anaerobic Culture (surgical/deep wound)     Status: None (Preliminary result)   Collection Time: 05/03/17  2:22 PM  Result Value Ref Range Status   Specimen Description WOUND LEFT WRIST  Final   Special Requests NONE  Final   Gram Stain   Final    FEW WBC PRESENT,BOTH PMN AND MONONUCLEAR NO ORGANISMS SEEN Performed at [facility], [address], [place], [place] [address]    Culture PENDING  Incomplete   Report Status PENDING  Incomplete     Labs: CBC: Recent Labs  Lab 04/30/17 2100 05/02/17 0548 05/04/17 0646  WBC 11.3* 11.9* 11.6*  NEUTROABS 8.8* 8.6*  --   HGB 12.8* 15.4 12.8*  HCT 37.5* 46.0  36.2*  MCV 94.9 98.3 99.7  PLT 237 214 247   Basic Metabolic Panel: Recent Labs  Lab 04/30/17 2100 05/02/17 0548 05/04/17 0646  NA 132* 137 137  K 4.2 4.1 5.1  CL 98* 100* 101  CO2 24 26 26   GLUCOSE 115* 79 111*  BUN 18 17 21*  CREATININE 0.80 0.94 0.85  CALCIUM 8.9 8.9 8.7*  MG 1.8  --   --    Liver Function Tests: Recent Labs  Lab 05/02/17 0548  AST 41  ALT 21  ALKPHOS 77  BILITOT 1.6*  PROT 6.9  ALBUMIN 3.3*    CBG: Recent Labs  Lab 05/02/17 2113 05/03/17 0649 05/03/17 1529 05/03/17 2034 05/04/17 0654  GLUCAP 133* 98 103* 248* 111*   Hgb A1c Recent Labs    05/02/17 0548  HGBA1C 5.5      Time coordinating discharge: Less than 30 minutes  SIGNED:  05/04/17, MD, FACP, [facility]. [facility] Pager [phone]  If 7PM-7AM, please contact night-coverage [email or web address] Password [email or web address] 05/04/2017, 1:05 PM

## 2017-05-04 NOTE — Progress Notes (Signed)
Pharmacy Antibiotic Note  Anthony Friedman is a 67 y.o. male admitted on 04/30/2017 with septic arthritis.  Pharmacy has been consulted for vancomycin dosing.  On day #4 of abx for septic arthritis of L wrist.  Had I&D on 2/27. Cx's pending but well after abx started. Afebrile, WBC 11.6.  Plan: Continue vancomycin 1250mg  IV q12h Monitor clinical picture, renal function, VT prn F/U C&S, abx deescalation / LOT  Height: 5' 9.02" (175.3 cm) Weight: 274 lb 16 oz (124.7 kg) IBW/kg (Calculated) : 70.74  Temp (24hrs), Avg:98 F (36.7 C), Min:97.6 F (36.4 C), Max:98.5 F (36.9 C)  Recent Labs  Lab 04/30/17 2100 05/02/17 0548 05/04/17 0646  WBC 11.3* 11.9* 11.6*  CREATININE 0.80 0.94 0.85    Estimated Creatinine Clearance: 113.1 mL/min (by C-G formula based on SCr of 0.85 mg/dL).    Allergies  Allergen Reactions  . Atenolol Other (See Comments)    Lowers heart rate  . Sulfa Antibiotics Other (See Comments)    Blood in urine    Thank you for allowing pharmacy to be a part of this patient's care.  05/06/17, PharmD, BCPS Clinical Pharmacist Pager [phone] 05/04/2017 11:46 AM

## 2017-05-04 NOTE — Discharge Instructions (Addendum)
Orthopaedic Trauma Service Discharge Instructions   General Discharge Instructions  WEIGHT BEARING STATUS: Nonweightbearing left wrist  RANGE OF MOTION/ACTIVITY: Keep splint on and clean, dry and intact  Wound Care: Keep splint on until you return to see me in clinic  DVT/PE prophylaxis: None needed  Diet: as you were eating previously.  Can use over the counter stool softeners and bowel preparations, such as Miralax, to help with bowel movements.  Narcotics can be constipating.  Be sure to drink plenty of fluids  PAIN MEDICATION USE AND EXPECTATIONS  You have likely been given narcotic medications to help control your pain.  After a traumatic event that results in an fracture (broken bone) with or without surgery, it is ok to use narcotic pain medications to help control one's pain.  We understand that everyone responds to pain differently and each individual patient will be evaluated on a regular basis for the continued need for narcotic medications. Ideally, narcotic medication use should last no more than 6-8 weeks (coinciding with fracture healing).   As a patient it is your responsibility as well to monitor narcotic medication use and report the amount and frequency you use these medications when you come to your office visit.   We would also advise that if you are using narcotic medications, you should take a dose prior to therapy to maximize you participation.  IF YOU ARE ON NARCOTIC MEDICATIONS IT IS NOT PERMISSIBLE TO OPERATE A MOTOR VEHICLE (MOTORCYCLE/CAR/TRUCK/MOPED) OR HEAVY MACHINERY DO NOT MIX NARCOTICS WITH OTHER CNS (CENTRAL NERVOUS SYSTEM) DEPRESSANTS SUCH AS ALCOHOL   STOP SMOKING OR USING NICOTINE PRODUCTS!!!!  As discussed nicotine severely impairs your body's ability to heal surgical and traumatic wounds but also impairs bone healing.  Wounds and bone heal by forming microscopic blood vessels (angiogenesis) and nicotine is a vasoconstrictor (essentially, shrinks blood  vessels).  Therefore, if vasoconstriction occurs to these microscopic blood vessels they essentially disappear and are unable to deliver necessary nutrients to the healing tissue.  This is one modifiable factor that you can do to dramatically increase your chances of healing your injury.    (This means no smoking, no nicotine gum, patches, etc)  DO NOT USE NONSTEROIDAL ANTI-INFLAMMATORY DRUGS (NSAID'S)  Using products such as Advil (ibuprofen), Aleve (naproxen), Motrin (ibuprofen) for additional pain control during fracture healing can delay and/or prevent the healing response.  If you would like to take over the counter (OTC) medication, Tylenol (acetaminophen) is ok.  However, some narcotic medications that are given for pain control contain acetaminophen as well. Therefore, you should not exceed more than 4000 mg of tylenol in a day if you do not have liver disease.  Also note that there are may OTC medicines, such as cold medicines and allergy medicines that my contain tylenol as well.  If you have any questions about medications and/or interactions please ask your doctor/PA or your pharmacist.      ICE AND ELEVATE INJURED/OPERATIVE EXTREMITY  Using ice and elevating the injured extremity above your heart can help with swelling and pain control.  Icing in a pulsatile fashion, such as 20 minutes on and 20 minutes off, can be followed.    Do not place ice directly on skin. Make sure there is a barrier between to skin and the ice pack.    Using frozen items such as frozen peas works well as the conform nicely to the are that needs to be iced.  USE AN ACE WRAP OR TED HOSE FOR SWELLING  CONTROL  In addition to icing and elevation, Ace wraps or TED hose are used to help limit and resolve swelling.  It is recommended to use Ace wraps or TED hose until you are informed to stop.    When using Ace Wraps start the wrapping distally (farthest away from the body) and wrap proximally (closer to the  body)   Example: If you had surgery on your leg or thing and you do not have a splint on, start the ace wrap at the toes and work your way up to the thigh        If you had surgery on your upper extremity and do not have a splint on, start the ace wrap at your fingers and work your way up to the upper arm  IF YOU ARE IN A SPLINT OR CAST DO NOT REMOVE IT FOR ANY REASON   If your splint gets wet for any reason please contact the office immediately. You may shower in your splint or cast as long as you keep it dry.  This can be done by wrapping in a cast cover or garbage back (or similar)  Do Not stick any thing down your splint or cast such as pencils, money, or hangers to try and scratch yourself with.  If you feel itchy take benadryl as prescribed on the bottle for itching  CALL THE OFFICE WITH ANY QUESTIONS OR CONCERNS: [phone]    Additional discharge instructions:  Please get your medications reviewed and adjusted by your Primary MD.  Please request your Primary MD to go over all Hospital Tests and Procedure/Radiological results at the follow up, please get all Hospital records sent to your Prim MD by signing hospital release before you go home.  If you had Pneumonia of Lung problems at the Hospital: Please get a 2 view Chest X ray done in 6-8 weeks after hospital discharge or sooner if instructed by your Primary MD.  If you have Congestive Heart Failure: Please call your Cardiologist or Primary MD anytime you have any of the following symptoms:  1) 3 pound weight gain in 24 hours or 5 pounds in 1 week  2) shortness of breath, with or without a dry hacking cough  3) swelling in the hands, feet or stomach  4) if you have to sleep on extra pillows at night in order to breathe  Follow cardiac low salt diet and 1.5 lit/day fluid restriction.  If you have diabetes Accuchecks 4 times/day, Once in AM empty stomach and then before each meal. Log in all results and show them to your  primary doctor at your next visit. If any glucose reading is under 80 or above 300 call your primary MD immediately.  If you have Seizure/Convulsions/Epilepsy: Please do not drive, operate heavy machinery, participate in activities at heights or participate in high speed sports until you have seen by Primary MD or a Neurologist and advised to do so again.  If you had Gastrointestinal Bleeding: Please ask your Primary MD to check a complete blood count within one week of discharge or at your next visit. Your endoscopic/colonoscopic biopsies that are pending at the time of discharge, will also need to followed by your Primary MD.  Get Medicines reviewed and adjusted. Please take all your medications with you for your next visit with your Primary MD  Please request your Primary MD to go over all hospital tests and procedure/radiological results at the follow up, please ask your Primary MD to get  all Hospital records sent to his/her office.  If you experience worsening of your admission symptoms, develop shortness of breath, life threatening emergency, suicidal or homicidal thoughts you must seek medical attention immediately by calling 911 or calling your MD immediately  if symptoms less severe.  You must read complete instructions/literature along with all the possible adverse reactions/side effects for all the Medicines you take and that have been prescribed to you. Take any new Medicines after you have completely understood and accpet all the possible adverse reactions/side effects.   Do not drive or operate heavy machinery when taking Pain medications.   Do not take more than prescribed Pain, Sleep and Anxiety Medications  Special Instructions: If you have smoked or chewed Tobacco  in the last 2 yrs please stop smoking, stop any regular Alcohol  and or any Recreational drug use.  Wear Seat belts while driving.  Please note You were cared for by a hospitalist during your hospital stay. If  you have any questions about your discharge medications or the care you received while you were in the hospital after you are discharged, you can call the unit and asked to speak with the hospitalist on call if the hospitalist that took care of you is not available. Once you are discharged, your primary care physician will handle any further medical issues. Please note that NO REFILLS for any discharge medications will be authorized once you are discharged, as it is imperative that you return to your primary care physician (or establish a relationship with a primary care physician if you do not have one) for your aftercare needs so that they can reassess your need for medications and monitor your lab values.  You can reach the hospitalist office at phone [phone] or fax [phone]   If you do not have a primary care physician, you can call [phone] for a physician referral.

## 2017-05-04 NOTE — Progress Notes (Signed)
Occupational Therapy Treatment Patient Details Name: Anthony Friedman MRN: [id number] DOB: Jun 28, 1950 Today's Date: 05/04/2017    History of present illness Admitted with L wrist pain, wrist splint ordered;  has a pertinent past medical history of Atelectasis, Bradycardia, , Chronic venous insufficiency, COPD (chronic obstructive pulmonary disease) (HCC),  Diabetes mellitus without complication (HCC), Edema, Elevated liver enzymes, Leg pain, Liver enlargement, Obesity, Pallor, Pneumonia, RA (rheumatoid arthritis) (HCC), Sleep apnea, SOBOE (shortness of breath on exertion), Superficial thrombophlebitis, Urinary frequency, Urinary tract bacterial infections, and Venous stasis. Bil TKAs, Bil hip surgeries, back surgery, spinal cord stimulator   OT comments  Pt progressing towards acute OT goals. Focus of session was strategies for edema control, discussed NWB LUE (per op note), and strategies/AE for ADLs. Pt requesting a sling for comfort, have left message with orthopedic MD to request a sling order. D/c plan remains appropriate.    Follow Up Recommendations  No OT follow up    Equipment Recommendations  None recommended by OT    Recommendations for Other Services      Precautions / Restrictions Precautions Precautions: Fall Precaution Comments: L wrist splint Restrictions Weight Bearing Restrictions: Yes LUE Weight Bearing: Non weight bearing Other Position/Activity Restrictions: per op note       Mobility Bed Mobility               General bed mobility comments: in chair on arrival  Transfers                 General transfer comment: in chair during session. Pt reports he has ambulated in the halls several times during admission.    Balance                                           ADL either performed or assessed with clinical judgement   ADL Overall ADL's : Needs assistance/impaired                     Lower Body Dressing: Minimal  assistance;Sit to/from stand Lower Body Dressing Details (indicate cue type and reason): pt reporting difficulty pulling up pants on left side. Discussed strategies and AE.               General ADL Comments: Pt requesting sling for comfort, left message with orthopedic MD. Discussed ADL strategies and positioning for edema control. Also discussed NWB status.     Vision       Perception     Praxis      Cognition Arousal/Alertness: Awake/alert Behavior During Therapy: WFL for tasks assessed/performed Overall Cognitive Status: Within Functional Limits for tasks assessed                                          Exercises Other Exercises Other Exercises: left message and advised pt to clarify with orthopedic MD/PA if any new restrictions to ROM of digits since surgery.    Shoulder Instructions       General Comments Pt reports some general weakness in L digits.    Pertinent Vitals/ Pain       Pain Assessment: Faces Faces Pain Scale: Hurts even more Pain Location: L hand  Pain Descriptors / Indicators: Discomfort;Throbbing Pain Intervention(s): Limited activity within patient's tolerance;Monitored during session;Repositioned;Other (comment)(pain med  being given during OT arrival)  Home Living                                          Prior Functioning/Environment              Frequency  Min 2X/week        Progress Toward Goals  OT Goals(current goals can now be found in the care plan section)  Progress towards OT goals: Progressing toward goals  Acute Rehab OT Goals Patient Stated Goal: Figure out what's going on with wrist OT Goal Formulation: With patient/family Time For Goal Achievement: 05/16/17 Potential to Achieve Goals: Good ADL Goals Additional ADL Goal #1: pt will don doff splint mod I as precursor to adls. Additional ADL Goal #2: pt will demonstrate L UE elevation in carter arm foam block for edema  management  Plan Discharge plan remains appropriate    Co-evaluation                 AM-PAC PT "6 Clicks" Daily Activity     Outcome Measure   Help from another person eating meals?: None Help from another person taking care of personal grooming?: None Help from another person toileting, which includes using toliet, bedpan, or urinal?: None Help from another person bathing (including washing, rinsing, drying)?: None Help from another person to put on and taking off regular upper body clothing?: None Help from another person to put on and taking off regular lower body clothing?: A Little 6 Click Score: 23    End of Session    OT Visit Diagnosis: Pain Pain - Right/Left: Left Pain - part of body: Hand   Activity Tolerance Patient tolerated treatment well   Patient Left in chair;with call bell/phone within reach   Nurse Communication Precautions;Weight bearing status        Time: 1116-1130 OT Time Calculation (min): 14 min  Charges: OT General Charges $OT Visit: 1 Visit OT Treatments $Self Care/Home Management : 8-22 mins    Pilar Grammes 05/04/2017, 11:52 AM

## 2017-05-06 LAB — BODY FLUID CULTURE: Culture: NO GROWTH

## 2017-05-08 LAB — AEROBIC/ANAEROBIC CULTURE W GRAM STAIN (SURGICAL/DEEP WOUND)

## 2017-05-08 LAB — AEROBIC/ANAEROBIC CULTURE (SURGICAL/DEEP WOUND): CULTURE: NO GROWTH

## 2017-05-18 ENCOUNTER — Ambulatory Visit: Payer: Medicare HMO | Admitting: Pulmonary Disease

## 2017-05-18 ENCOUNTER — Encounter: Payer: Self-pay | Admitting: Pulmonary Disease

## 2017-05-18 VITALS — BP 110/80 | HR 67 | Ht 69.0 in | Wt 279.0 lb

## 2017-05-18 DIAGNOSIS — F17210 Nicotine dependence, cigarettes, uncomplicated: Secondary | ICD-10-CM

## 2017-05-18 DIAGNOSIS — G4733 Obstructive sleep apnea (adult) (pediatric): Secondary | ICD-10-CM

## 2017-05-18 DIAGNOSIS — R400 Somnolence: Secondary | ICD-10-CM | POA: Diagnosis not present

## 2017-05-18 MED ORDER — FLUTICASONE FUROATE-VILANTEROL 100-25 MCG/INH IN AEPB: 1.0000 | INHALATION_SPRAY | Freq: Every day | RESPIRATORY_TRACT | 0 refills | Status: AC

## 2017-05-18 MED ORDER — FLUTICASONE FUROATE-VILANTEROL 100-25 MCG/INH IN AEPB: 1.0000 | INHALATION_SPRAY | Freq: Every day | RESPIRATORY_TRACT | 2 refills | Status: DC

## 2017-05-18 NOTE — Addendum Note (Signed)
Addended by: Maxwell Marion A on: 05/18/2017 11:32 AM   Modules accepted: Orders

## 2017-05-18 NOTE — Patient Instructions (Signed)
Obstructive sleep apnea: Go have your dental device checked by the dentist who made it After he has seen it we will arrange for a home sleep study (you need to call us when you are ready to do the home sleep study)  COPD: Take Breo 1 puff daily no matter how you feel Use albuterol as needed for chest tightness wheezing or shortness of breath  Tobacco abuse: I agree with the plans to start Chantix to quit smoking  Follow up in 3 months or sooner if needed

## 2017-05-18 NOTE — Addendum Note (Signed)
Addended by: Zane Herald R on: 05/18/2017 11:13 AM   Modules accepted: Orders
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
## 2017-06-12 ENCOUNTER — Telehealth: Payer: Self-pay | Admitting: Pulmonary Disease

## 2017-06-12 NOTE — Telephone Encounter (Signed)
Called patient, unable to reach left message to give us a call back. 

## 2017-06-13 NOTE — Telephone Encounter (Signed)
Left message to call back but left samples of Breo up front for him to pick up.

## 2017-06-14 ENCOUNTER — Ambulatory Visit (INDEPENDENT_AMBULATORY_CARE_PROVIDER_SITE_OTHER): Payer: Medicare HMO | Admitting: Orthopaedic Surgery

## 2017-06-14 ENCOUNTER — Encounter (INDEPENDENT_AMBULATORY_CARE_PROVIDER_SITE_OTHER): Payer: Self-pay | Admitting: Orthopaedic Surgery

## 2017-06-14 DIAGNOSIS — Z96641 Presence of right artificial hip joint: Secondary | ICD-10-CM

## 2017-06-14 DIAGNOSIS — Z96642 Presence of left artificial hip joint: Secondary | ICD-10-CM

## 2017-06-14 DIAGNOSIS — Z96659 Presence of unspecified artificial knee joint: Secondary | ICD-10-CM | POA: Diagnosis not present

## 2017-06-14 DIAGNOSIS — T84068S Wear of articular bearing surface of other internal prosthetic joint, sequela: Secondary | ICD-10-CM

## 2017-06-14 NOTE — Progress Notes (Signed)
Patient is very well-known to me.  He has had bilateral hip replacements and he has a history of bilateral knee replacements.  I did his total hip replacements.  His knees were done elsewhere.  He is 7 months out from a left knee polyliner exchange that we did.  He said that his left knee is much more stable than before and he does not feel like to give out.  He is ambulate with a cane today.  He has had a significant problem with pseudogout recently and was hospitalized and had an irrigation debridement of his left wrist.  He still having problems getting up from a seated position.  He is someone who has had bariatric surgery and lost a significant amount of weight but has gained some of the back due to immobility.  On exam his left knee does feel stable.  There is no evidence of infection or inflammation as far as his hips and knees ago.  Both knees have good range of motion of both hips have good range of motion.  I gave him reassurance about weight loss and strengthening his hips and knees.  We will see him in 4 months.  At that visit I would like a low AP pelvis and AP and lateral both knees.

## 2017-06-14 NOTE — Telephone Encounter (Signed)
Called and spoke with pt letting him know that Delaney Meigs had placed a sample up front for him to pick up at his convenience.  Pt expressed understanding and stated to me he would come by the office tomorrow, 06/15/17 to pick up sample.  Nothing further needed at this time.

## 2017-08-03 ENCOUNTER — Telehealth: Payer: Self-pay | Admitting: Pulmonary Disease

## 2017-08-03 MED ORDER — FLUTICASONE FUROATE-VILANTEROL 100-25 MCG/INH IN AEPB: 1.0000 | INHALATION_SPRAY | Freq: Every day | RESPIRATORY_TRACT | 0 refills | Status: DC

## 2017-08-03 NOTE — Telephone Encounter (Signed)
Spoke with patient. He is aware that the Breo samples will be placed up front for pick up. He stated that he will fill out patient assistance forms after his June appt with BQ.  Nothing else needed at time of call.

## 2017-08-03 NOTE — Telephone Encounter (Signed)
Pt is returning call. Cb is [phone].

## 2017-08-03 NOTE — Telephone Encounter (Signed)
lmtcb for pt.  Will need to see if pt is having a hard time affording Breo, if so pt assistance forms needs to be given. Pt cannot continue to receive samples every month.

## 2017-08-30 ENCOUNTER — Encounter: Payer: Self-pay | Admitting: Pulmonary Disease

## 2017-08-30 ENCOUNTER — Ambulatory Visit: Payer: Medicare HMO | Admitting: Pulmonary Disease

## 2017-08-30 VITALS — BP 124/82 | HR 57 | Ht 69.0 in | Wt 276.2 lb

## 2017-08-30 DIAGNOSIS — R400 Somnolence: Secondary | ICD-10-CM

## 2017-08-30 DIAGNOSIS — G4733 Obstructive sleep apnea (adult) (pediatric): Secondary | ICD-10-CM | POA: Diagnosis not present

## 2017-08-30 DIAGNOSIS — F17210 Nicotine dependence, cigarettes, uncomplicated: Secondary | ICD-10-CM | POA: Diagnosis not present

## 2017-08-30 DIAGNOSIS — J449 Chronic obstructive pulmonary disease, unspecified: Secondary | ICD-10-CM | POA: Diagnosis not present

## 2017-08-30 MED ORDER — TRAZODONE HCL 50 MG PO TABS: 50.0000 mg | ORAL_TABLET | Freq: Every day | ORAL | 2 refills | Status: DC

## 2017-08-30 MED ORDER — FLUTICASONE FUROATE-VILANTEROL 100-25 MCG/INH IN AEPB: 1.0000 | INHALATION_SPRAY | Freq: Every day | RESPIRATORY_TRACT | 0 refills | Status: DC

## 2017-08-30 NOTE — Patient Instructions (Signed)
Discussion: Anthony Friedman says that his sleep has been terrible but his lungs have been pretty good here recently.  He is compliant with Breo.  It sounds as if he has a fairly complex case of sleep apnea and possibly insomnia.  I think that the Percocet is inhibiting his sleep at night we talked about this today.  He probably needs to be back on CPAP but he says that this actually worsened his sleep in the past.  Plan: Obstructive sleep apnea, worsening: Continue using the dental device at night We will arrange for a home sleep study, use the dental device when you take that test I am going to have you follow-up with 1 of my sleep partners when you come back  COPD: Take Breo 1 puff daily no matter how you feel Use albuterol as needed for chest tightness wheezing or shortness of breath  Cigarette smoker: Stop smoking  Follow-up: You need to follow-up with 1 of my partners who practices sleep medicine and a 30-minute consult visit You need to follow-up with me in 6 months

## 2017-08-30 NOTE — Progress Notes (Signed)
Subjective:    Patient ID: Anthony Friedman, male    DOB: February 11, 1951, 67 y.o.   MRN: [id number]  Synopsis: Former patient of Dr. Delford Field listed as having COPD who first established care with [facility] in 08/2015.  He quit smoking after 40 years of smoking as much as 2ppd in 2012, he briefly smoked again in 2015.    HPI Chief Complaint  Patient presents with  . Follow-up    Doing well no SOB , Wheezing at this time.   [name] says he is smoking less than he was when he saw me the last time.  He says that he saw his dentist recently and he uses the dental appliance nearly every night.  He says that his sleep is terrible however.  He often can't tolerate the machine.  He notes that he wakes up a lot at night, typically 3-4 times a night.  He says that when he was on CPAP he didn't sleep well then either.  He says that he still struggles to fall asleep during the daytime and early evening.  He says that he would give "anything" for 6 hours of un-interrupted sleep.  He actually thinks the CPAP made him wake up more.  He doesn't take any sort of sleep aid, though he has used Benadryl in the past.  He typically takes oxycodone in the evening due to lots of pain.   He drinks 12-14 oz of coffee in the mornings, though he will drink more some days.   He has been taking the Breo.  He continues to smoke some and has cut back.    No episodes of bronchitis or pneumonia since last visit  Past Medical History:  Diagnosis Date  . Atelectasis   . 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 Chronic allergic rhinitis   . Chronic venous insufficiency    superficial  . COPD (chronic obstructive pulmonary disease) (HCC)   . Cough   . Diabetes mellitus without complication (HCC)    not on medications since  surgery 10/2015   . Edema    lower extremities  . Elevated liver enzymes    d/t arava  . Family history of adverse reaction to anesthesia    pts mother had difficulty with  anesthesia - pt not sure what difficulties were   . Hypertension    no longer on blood pressure medications since 10/25/2015 per patient   . Kidney stones    hx of   . Leg pain   . Liver enlargement    d/t arava  . Obesity   . Pallor   . Pneumonia    hx of times 3; pt states is current with pneumonia vaccine  . RA (rheumatoid arthritis) (HCC)     rheumatoid arthritis   . Sleep apnea    say he uses a mouth guard that incremently increases airflow   . SOBOE (shortness of breath on exertion)   . Superficial thrombophlebitis    subacute-on anticoagulation  . Urinary frequency   . Urinary tract bacterial infections    hx of   . Venous stasis    changes      Review of Systems  Constitutional: Negative for chills, fatigue and fever.  HENT: Negative for postnasal drip, rhinorrhea and sinus pressure.   Respiratory: Positive for shortness of breath. Negative for cough and wheezing.   Cardiovascular: Negative for chest pain, palpitations and leg swelling.  Objective:   Physical Exam Vitals:   08/30/17 1039  BP: 124/82  Pulse: (!) 57  SpO2: 94%  Weight: 276 lb 3.2 oz (125.3 kg)  Height: 5\' 9"  (1.753 m)   RA  Gen: chronically ill appearing, morbidly obese HENT: OP clear, TM's clear, neck supple PULM: CTA B, normal percussion CV: RRR, no mgr, trace edema GI: BS+, soft, nontender Derm: no cyanosis or rash Psyche: normal mood and affect   PFT Spirometry 10/25/2012: FEV1 51% predicted FVC 49% predicted FEV1 FVC ratio 82% predicted FEF 25-75   63% predicted Spirometry 04/2015 > no airflow obstruction, Ratio 72%, FEV1 1.48 42%, FVC 45%     Assessment & Plan:    Daytime somnolence - Plan: Home sleep test  OSA (obstructive sleep apnea)  Cigarette smoker  COPD gold stage C.  Discussion: [name] says that his sleep has been terrible but his lungs have been pretty good here recently.  He is compliant with Breo.  It sounds as if he has a fairly complex case of  sleep apnea and possibly insomnia.  I think that the Percocet is inhibiting his sleep at night we talked about this today.  He probably needs to be back on CPAP but he says that this actually worsened his sleep in the past.  Plan: Obstructive sleep apnea, worsening: Continue using the dental device at night We will arrange for a home sleep study, use the dental device when you take that test I am going to have you follow-up with 1 of my sleep partners when you come back  COPD: Take Breo 1 puff daily no matter how you feel Use albuterol as needed for chest tightness wheezing or shortness of breath  Cigarette smoker: Stop smoking  Follow-up: You need to follow-up with 1 of my partners who practices sleep medicine and a 30-minute consult visit You need to follow-up with me in 6 months    Current Outpatient Medications:  .  albuterol (PROAIR HFA) 108 (90 Base) MCG/ACT inhaler, Inhale 2 puffs into the lungs every 6 (six) hours as needed for wheezing., Disp: , Rfl:  .  amoxicillin (AMOXIL) 500 MG capsule, TAKE 4 CAPSULES 1 HOUR BEFORE DENTAL PROCEDURE, Disp: , Rfl: 0 .  aspirin EC 81 MG tablet, Take 1 tablet (81 mg total) by mouth daily., Disp: , Rfl:  .  Calcium Carb-Cholecalciferol (CALCIUM 600 + D PO), Take 1 tablet by mouth 3 (three) times daily. , Disp: , Rfl:  .  Cyanocobalamin (B-12) 500 MCG SUBL, Place 500 mcg under the tongue See admin instructions. Take one tablet (500 mcg) sublingually twice weekly - Monday and Thursday, Disp: , Rfl:  .  doxycycline (VIBRA-TABS) 100 MG tablet, Take 1 tablet (100 mg total) by mouth 2 (two) times daily with a meal., Disp: 14 tablet, Rfl: 0 .  fluticasone furoate-vilanterol (BREO ELLIPTA) 100-25 MCG/INH AEPB, Inhale 1 puff into the lungs daily., Disp: 60 each, Rfl: 2 .  fluticasone furoate-vilanterol (BREO ELLIPTA) 100-25 MCG/INH AEPB, Inhale 1 puff into the lungs daily., Disp: 1 each, Rfl: 0 .  folic acid (FOLVITE) 400 MCG tablet, Take 800 mcg by  mouth daily., Disp: , Rfl:  .  gabapentin (NEURONTIN) 300 MG capsule, Take 300 mg by mouth 3 (three) times daily. , Disp: , Rfl:  .  Multiple Vitamin (MULTIVITAMIN WITH MINERALS) TABS tablet, Take 1 tablet by mouth 2 (two) times daily., Disp: , Rfl:  .  oxyCODONE-acetaminophen (PERCOCET) 10-325 MG tablet, Take 1 tablet by mouth  every 6 (six) hours as needed for pain., Disp: 30 tablet, Rfl: 0 .  predniSONE (STERAPRED UNI-PAK 48 TAB) 5 MG (48) TBPK tablet, See admin instructions. see package, Disp: , Rfl: 0 .  rOPINIRole (REQUIP) 1 MG tablet, Take 1 mg by mouth 3 (three) times daily as needed (restless legs). , Disp: , Rfl:  .  Tamsulosin HCl (FLOMAX) 0.4 MG CAPS, Take 0.4 mg by mouth daily. , Disp: , Rfl:  .  CHANTIX 1 MG tablet, Take 1 mg by mouth 2 (two) times daily., Disp: , Rfl: 2 .  traZODone (DESYREL) 50 MG tablet, Take 1 tablet (50 mg total) by mouth at bedtime., Disp: 30 tablet, Rfl: 2

## 2017-09-27 DIAGNOSIS — G4733 Obstructive sleep apnea (adult) (pediatric): Secondary | ICD-10-CM | POA: Diagnosis not present

## 2017-09-28 ENCOUNTER — Other Ambulatory Visit: Payer: Self-pay | Admitting: *Deleted

## 2017-09-28 DIAGNOSIS — R400 Somnolence: Secondary | ICD-10-CM

## 2017-09-29 DIAGNOSIS — G4733 Obstructive sleep apnea (adult) (pediatric): Secondary | ICD-10-CM | POA: Diagnosis not present

## 2017-10-03 ENCOUNTER — Telehealth: Payer: Self-pay

## 2017-10-03 NOTE — Telephone Encounter (Signed)
-----   Message from Lupita Leash, MD sent at 09/29/2017  1:35 PM EDT ----- Has Sleep apnea per sleep study He and I talked about a dental appliance, I recommend Dr. Althea Grimmer.  See if this is the direction he wants to go ----- Message ----- From: Coralyn Helling, MD Sent: 09/29/2017  10:05 AM To: Lupita Leash, MD  Anthony Friedman,  Home sleep study 09/27/17 >> mild obstructive sleep apnea.  AHI 10.9, SaO2 low 83%.   Thanks.  [place]

## 2017-10-03 NOTE — Telephone Encounter (Signed)
Attempted to call patient, no answer, left voicemail to call back.  

## 2017-10-06 NOTE — Telephone Encounter (Signed)
Called and talked to patient. Patient stated that he has had the dental appliance for years and was wearing the appliance during the sleep study.  Routing to BQ

## 2017-10-07 NOTE — Telephone Encounter (Signed)
My recollection is that he said the prior device didn't work well.   Because he had only mild disease he is welcome to wear the device at night and have an Rx for 2L O2 qHS, then we can re-assess.

## 2017-10-09 NOTE — Telephone Encounter (Signed)
Called and spoke to patient. Patient has an appointment with Dr. Craige Cotta on 10/16/17 and plans to talk with him about the sleep study results and options at that time. Nothing further needed.  Routing to BQ as FYI.

## 2017-10-16 ENCOUNTER — Encounter: Payer: Self-pay | Admitting: Pulmonary Disease

## 2017-10-16 ENCOUNTER — Ambulatory Visit (INDEPENDENT_AMBULATORY_CARE_PROVIDER_SITE_OTHER): Payer: Medicare HMO | Admitting: Pulmonary Disease

## 2017-10-16 VITALS — BP 126/76 | HR 61 | Ht 69.0 in | Wt 291.8 lb

## 2017-10-16 DIAGNOSIS — G4733 Obstructive sleep apnea (adult) (pediatric): Secondary | ICD-10-CM | POA: Diagnosis not present

## 2017-10-16 NOTE — Patient Instructions (Addendum)
Follow up with Dr. Kendrick Fries
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
## 2017-10-16 NOTE — Progress Notes (Signed)
   Subjective:    Patient ID: Anthony Friedman, male    DOB: 1951-01-13, 67 y.o.   MRN: [id number]  HPI    Review of Systems  Constitutional: Negative.   HENT: Negative.   Eyes: Negative.   Respiratory: Positive for apnea.   Cardiovascular: Negative.   Gastrointestinal: Negative.   Endocrine: Negative.   Genitourinary: Negative.   Musculoskeletal: Negative.   Skin: Negative.   Allergic/Immunologic: Negative.   Neurological: Negative.   Hematological: Negative.   Psychiatric/Behavioral: Positive for sleep disturbance.       Objective:   Physical Exam        Assessment & Plan:

## 2017-10-18 ENCOUNTER — Ambulatory Visit (INDEPENDENT_AMBULATORY_CARE_PROVIDER_SITE_OTHER): Payer: Medicare HMO

## 2017-10-18 ENCOUNTER — Ambulatory Visit (INDEPENDENT_AMBULATORY_CARE_PROVIDER_SITE_OTHER): Payer: Medicare HMO | Admitting: Orthopaedic Surgery

## 2017-10-18 ENCOUNTER — Other Ambulatory Visit (INDEPENDENT_AMBULATORY_CARE_PROVIDER_SITE_OTHER): Payer: Self-pay

## 2017-10-18 ENCOUNTER — Telehealth: Payer: Self-pay | Admitting: Pulmonary Disease

## 2017-10-18 ENCOUNTER — Encounter (INDEPENDENT_AMBULATORY_CARE_PROVIDER_SITE_OTHER): Payer: Self-pay | Admitting: Orthopaedic Surgery

## 2017-10-18 DIAGNOSIS — M25562 Pain in left knee: Secondary | ICD-10-CM

## 2017-10-18 DIAGNOSIS — Z96659 Presence of unspecified artificial knee joint: Secondary | ICD-10-CM

## 2017-10-18 DIAGNOSIS — M25561 Pain in right knee: Secondary | ICD-10-CM

## 2017-10-18 DIAGNOSIS — Z96642 Presence of left artificial hip joint: Secondary | ICD-10-CM | POA: Diagnosis not present

## 2017-10-18 DIAGNOSIS — T84068S Wear of articular bearing surface of other internal prosthetic joint, sequela: Secondary | ICD-10-CM

## 2017-10-18 DIAGNOSIS — G8929 Other chronic pain: Secondary | ICD-10-CM

## 2017-10-18 DIAGNOSIS — M25551 Pain in right hip: Secondary | ICD-10-CM

## 2017-10-18 DIAGNOSIS — M25552 Pain in left hip: Principal | ICD-10-CM

## 2017-10-18 MED ORDER — FLUTICASONE FUROATE-VILANTEROL 100-25 MCG/INH IN AEPB: 1.0000 | INHALATION_SPRAY | Freq: Every day | RESPIRATORY_TRACT | 0 refills | Status: DC

## 2017-10-18 NOTE — Progress Notes (Signed)
Office Visit Note   Patient: Anthony Friedman           Date of Birth: 02-Aug-1950           MRN: [id number] Visit Date: 10/18/2017              Requested by: Ilona Sorrel, NP [address] [place], [place] [address] PCP: Herma Carson, MD   Assessment & Plan: Visit Diagnoses:  1. Status post left hip replacement   2. 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  This would help with his balance and gait training coordination.  Formal physical therapy is definitely needed at this situation in the standpoint.  All question concerns were answered and addressed.  We will see him back in about 2 months to see how he is done after a course of formal therapy.  No x-rays are needed at that visit.  Follow-Up Instructions: Return in about 8 weeks (around 12/13/2017).   Orders:  Orders Placed This Encounter  Procedures  . XR Pelvis 1-2 Views  . XR Knee 1-2 Views Left  . XR Knee 1-2 Views Right   No orders of the defined types were placed in this encounter.     Procedures: No procedures performed   Clinical Data: No additional findings.   Subjective: Chief Complaint  Patient presents with  . Right Hip - Follow-up  . Left Knee - Follow-up  . Right Knee - Follow-up  The patient has a history of bilateral total hip arthroplasties and bilateral total knee arthroplasties.  We have performed hip replacements.  The knee replacements were performed elsewhere.  We have performed a polyliner exchange on his left knee due to poly-wear in the knee to increase tightness in the knee joint itself due to instability of the knee.  The implants themselves have been doing well.  He ambulate using a cane.  He still is not where he wants to be in terms of  strengthening of his left lower extremity.  He says his right lower extremity is doing fine.  He is someone who has had bariatric surgery and is still working on weight loss.  HPI  Review of Systems He currently denies any headache, chest pain, shortness of breath, fever, chills, nausea, vomiting.  Objective: Vital Signs: There were no vitals taken for this visit.  Physical Exam He is alert and oriented x3 and in no acute distress Ortho Exam Examination of both hips show fluid and full range of motion with no significant problems.  He is more weak on the left hip.  Both knees move well with no effusion and no instability but just weak muscle groups. Specialty Comments:  No specialty comments available.  Imaging: Xr Knee 1-2 Views Left  Result Date: 10/18/2017 2 views of the left knee show a total knee arthroplasty with no complicating features.  Xr Knee 1-2 Views Right  Result Date: 10/18/2017 2 views of the right knee show a total knee arthroplasty with no complicating features.  Xr Pelvis 1-2 Views  Result Date: 10/18/2017 An AP pelvis shows bilateral total hip arthroplasties with no complicating features.  There are no acute findings.    PMFS History: Patient Active Problem List   Diagnosis Date Noted  . Cellulitis of left hand 05/02/2017  .  Left wrist pain 05/01/2017  . 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 Leg hematoma, left, initial encounter 11/02/2016  . OSA (obstructive sleep apnea) 02/02/2016  . Osteoarthritis of left hip 12/11/2015  . Status post left hip replacement 12/11/2015  . Bradycardia 10/28/2015  . Morbid obesity (HCC) 10/26/2015  . Pre-op evaluation 04/16/2015  . Osteoarthritis of right hip 08/08/2014  . Status post total replacement of right hip 08/08/2014  . 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 Edema, peripheral 10/29/2013  . Insufficiency, vascular 10/29/2013  . Cervical  osteoarthritis 09/13/2013  . Cervical pain 09/05/2013  . 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 Benign prostatic hypertrophy without urinary obstruction 04/04/2013  . Restless leg 04/04/2013  . Diabetes mellitus, type 2 (HCC) 04/04/2013  . 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 Anxiety 12/20/2012  . DDD (degenerative disc disease), lumbosacral 12/20/2012  . 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 Varicose veins of both legs with edema 10/25/2012  . Acquired spondylolisthesis 08/23/2012  . Chronic pain 08/11/2012  . Excessive urination at night 08/11/2012  . RA (rheumatoid arthritis) (HCC)   . Chronic venous insufficiency   . COPD gold stage C.   . Obesity   . Hypertension   . Chronic allergic rhinitis    Past Medical History:  Diagnosis Date  . Atelectasis   . 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 Chronic allergic rhinitis   . Chronic venous insufficiency    superficial  . COPD (chronic obstructive pulmonary disease) (HCC)   . Cough   . Diabetes mellitus without complication (HCC)    not on medications since  surgery 10/2015   . Edema    lower extremities  . Elevated liver enzymes    d/t arava  . Family history of adverse reaction to anesthesia    pts mother had difficulty with anesthesia - pt not sure what difficulties were   . Hypertension    no longer on blood pressure medications since 10/25/2015 per patient   . Kidney stones    hx of   . Leg pain   . Liver enlargement    d/t arava  . Obesity   . Pallor   . Pneumonia    hx of times 3; pt states is current with pneumonia vaccine  . RA (rheumatoid arthritis) (HCC)     rheumatoid arthritis   . Sleep apnea    say he uses a mouth guard that incremently increases airflow   . SOBOE (shortness of breath on exertion)   . Superficial thrombophlebitis    subacute-on anticoagulation  . Urinary frequency   . Urinary tract bacterial infections    hx of   . Venous stasis     changes    Family History  Problem Relation Age of Onset  . Heart attack Father   . Esophageal cancer Mother   . Other Mother        Tachycardia  . Diabetes Brother     Past Surgical History:  Procedure Laterality Date  . HAND SURGERY  1965   right hand  . I&D EXTREMITY Left 05/03/2017   Procedure: IRRIGATION AND DEBRIDEMENT EXTREMITY;  Surgeon: Roby Lofts, MD;  Location: [facility] OR;  Service: Orthopedics;  Laterality: Left;  . I&D KNEE WITH POLY EXCHANGE Left 11/04/2016   Procedure: LEFT KNEE POLY EXCHANGE;  Surgeon: Kathryne Hitch, MD;  Location: [facility] ORS;  Service: Orthopedics;  Laterality: Left;  .  KNEE SURGERY     arthroscopic right and left knee  . LAMINECTOMY  1988   and fusion; L4, L5, S1  . LAMINECTOMY  2007   and fusion L1, L2, L3  . LAPAROSCOPIC GASTRIC SLEEVE RESECTION N/A 10/26/2015   Procedure: LAPAROSCOPIC GASTRIC SLEEVE RESECTION, UPPER ENDO;  Surgeon: Ovidio Kin, MD;  Location: [facility];  Service: General;  Laterality: N/A;  . OTHER SURGICAL HISTORY     polynidal cyst  . REPLACEMENT TOTAL KNEE  2009   left 2009; right 2014  . SPINAL CORD STIMULATOR IMPLANT     to control back pain   . TOTAL HIP ARTHROPLASTY Right 08/08/2014   Procedure: RIGHT TOTAL HIP ARTHROPLASTY ANTERIOR APPROACH;  Surgeon: Kathryne Hitch, MD;  Location: [facility];  Service: Orthopedics;  Laterality: Right;  . TOTAL HIP ARTHROPLASTY Left 12/11/2015   Procedure: LEFT TOTAL HIP ARTHROPLASTY ANTERIOR APPROACH;  Surgeon: Kathryne Hitch, MD;  Location: [facility];  Service: Orthopedics;  Laterality: Left;  . UMBILICAL HERNIA REPAIR     Social History   Occupational History  . Occupation: disabled     Comment: former Chief Financial Officer, office work   Tobacco Use  . Smoking status: Former Smoker    Packs/day: 2.00    Years: 45.00    Pack years: 90.00    Types: Cigarettes    Last attempt to quit: 07/19/2017    Years since quitting: 0.2  . Smokeless tobacco: Never Used  Substance  and Sexual Activity  . Alcohol use: No    Alcohol/week: 0.0 standard drinks    Comment: recovering alcoholic - none since 2012   . Drug use: No    Comment: hx of marijuana in teenage years  . Sexual activity: Not on file

## 2017-10-18 NOTE — Telephone Encounter (Signed)
Spoke with pt. He is requesting samples of Breo 100. These have been left up front for the pt to pick up. While speaking to the pt he had a question about his sleep medication. At his last OV, BQ started him on Trazodone 50mg  QHS. He states that he doesn't feel like this is strong enough and would like to have it increased. Advised him that BQ was out of the office this week and will return next week. Pt was fine with waiting until BQ returns for an answer.  BQ - please advise. Thanks.

## 2017-10-24 MED ORDER — TRAZODONE HCL 100 MG PO TABS: 100.0000 mg | ORAL_TABLET | Freq: Every day | ORAL | 2 refills | Status: DC

## 2017-10-24 NOTE — Telephone Encounter (Signed)
OK to give sample of Breo Increase trazodone to 100mg  po qHS

## 2017-10-24 NOTE — Telephone Encounter (Signed)
Attempted to call pt. I did not receive an answer. I have left a message for pt to return our call.  

## 2017-10-24 NOTE — Telephone Encounter (Signed)
Called and spoke with patient regarding BQ recommendations Advised pt that BQ has increased trasadone to 100mg  daily at bedtime Called pharmacy to change the rx over the phone Pt verbalized understanding Nothing further needed.

## 2017-10-30 ENCOUNTER — Encounter: Payer: Self-pay | Admitting: Physical Therapy

## 2017-10-30 ENCOUNTER — Other Ambulatory Visit: Payer: Self-pay

## 2017-10-30 ENCOUNTER — Ambulatory Visit: Payer: Medicare HMO | Attending: Orthopaedic Surgery | Admitting: Physical Therapy

## 2017-10-30 DIAGNOSIS — R262 Difficulty in walking, not elsewhere classified: Secondary | ICD-10-CM

## 2017-10-30 DIAGNOSIS — M25552 Pain in left hip: Secondary | ICD-10-CM

## 2017-10-30 DIAGNOSIS — M6281 Muscle weakness (generalized): Secondary | ICD-10-CM

## 2017-10-30 DIAGNOSIS — M25652 Stiffness of left hip, not elsewhere classified: Secondary | ICD-10-CM

## 2017-10-30 NOTE — Therapy (Addendum)
[facility] [address]  [place], [place], [address] Phone: [phone]   Fax:  [phone]  Physical Therapy Evaluation  Patient Details  Name: Anthony Friedman MRN: [id number] Date of Birth: 1950-09-30 Referring Provider: Jean Rosenthal, MD   Progress Note Reporting Period 10/30/17 to 10/30/17  See note below for Objective Data and Assessment of Progress/Goals.    Encounter Date: 10/30/2017  PT End of Session - 10/30/17 1831    Visit Number  1    Number of Visits  7    Date for PT Re-Evaluation  12/11/17    Authorization Type  [employer]    PT Start Time  5573    PT Stop Time  2202    PT Time Calculation (min)  44 min    Activity Tolerance  Patient limited by pain;Patient tolerated treatment well    Behavior During Therapy  [facility] for tasks assessed/performed       Past Medical History:  Diagnosis Date  . Atelectasis   . 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 Chronic allergic rhinitis   . Chronic venous insufficiency    superficial  . COPD (chronic obstructive pulmonary disease) ([place])   . Cough   . Diabetes mellitus without complication ([place])    not on medications since  surgery 10/2015   . Edema    lower extremities  . Elevated liver enzymes    d/t arava  . Family history of adverse reaction to anesthesia    pts mother had difficulty with anesthesia - pt not sure what difficulties were   . Hypertension    no longer on blood pressure medications since 10/25/2015 per patient   . Kidney stones    hx of   . Leg pain   . Liver enlargement    d/t arava  . Obesity   . Pallor   . Pneumonia    hx of times 3; pt states is current with pneumonia vaccine  . RA (rheumatoid arthritis) (HCC)     rheumatoid arthritis   . Sleep apnea    say he uses a mouth guard that incremently increases airflow   . SOBOE (shortness of breath on exertion)   . Superficial  thrombophlebitis    subacute-on anticoagulation  . Urinary frequency   . Urinary tract bacterial infections    hx of   . Venous stasis    changes    Past Surgical History:  Procedure Laterality Date  . HAND SURGERY  1965   right hand  . I&D EXTREMITY Left 05/03/2017   Procedure: IRRIGATION AND DEBRIDEMENT EXTREMITY;  Surgeon: Shona Needles, MD;  Location: [facility];  Service: Orthopedics;  Laterality: Left;  . I&D KNEE WITH POLY EXCHANGE Left 11/04/2016   Procedure: LEFT KNEE POLY EXCHANGE;  Surgeon: Mcarthur Rossetti, MD;  [facility];  Service: Orthopedics;  Laterality: Left;  . KNEE SURGERY     arthroscopic right and left knee  . LAMINECTOMY  1988   and fusion; L4, L5, S1  . LAMINECTOMY  2007   and fusion L1, L2, L3  . LAPAROSCOPIC GASTRIC SLEEVE RESECTION N/A 10/26/2015   Procedure: LAPAROSCOPIC GASTRIC SLEEVE RESECTION, UPPER ENDO;  Surgeon: Alphonsa Overall, MD;  Location[facility];  Service: General;  Laterality: N/A;  . OTHER SURGICAL HISTORY     polynidal cyst  . REPLACEMENT TOTAL KNEE  2009   left 2009; right 2014  .  SPINAL CORD STIMULATOR IMPLANT     to control back pain   . TOTAL HIP ARTHROPLASTY Right 08/08/2014   Procedure: RIGHT TOTAL HIP ARTHROPLASTY ANTERIOR APPROACH;  Surgeon: Mcarthur Rossetti, MD;  [facility];  Service: Orthopedics;  Laterality: Right;  . TOTAL HIP ARTHROPLASTY Left 12/11/2015   Procedure: LEFT TOTAL HIP ARTHROPLASTY ANTERIOR APPROACH;  Surgeon: Mcarthur Rossetti, MD;  [facility];  Service: Orthopedics;  Laterality: Left;  . UMBILICAL HERNIA REPAIR      There were no vitals filed for this visit.   Subjective Assessment - 10/30/17 1532    Subjective  Patient reports hx of B TKA, B THA, and laminectomy/fusion in lumbar spine. Reports a year ago the metal insert in between L TKA was not fitting property and was subsequently changed out, which he thought would improve his pain levels. Went to gym to "try to rehab it."  However still having significant L knee pain at this point for the past year. Patient would like to try PT but apprehensive about long term PT d/t copay. Current symptoms include L anterior thigh pain with radiation into the groin at times. Aggravating factors include: getting up into his SUV, supine hip flexion, stairs. Patient uses a scooter at the store and chooses not to walk long distances. Has fallen a couple times and now is nervous about daily tasks because of unsteadiness.     Pertinent History  B THA and B TKA, polyliner exchange of L knee, atelectasis, bradycardia, chronic venouc insufficiency, COPD, DM, HTN, kidney stones, liver englargement, RA, superficial thrombophlebitis, L4-S1 lami and fusion, L1-3 lami and fusion, spinal cord stimulator    Limitations  House hold activities;Walking   getting into car, lifting L LE up in bed   How long can you sit comfortably?  unlimited    How long can you stand comfortably?  patient uncertain    How long can you walk comfortably?  patient uncertain    Diagnostic tests  10/18/17 x ray of R and L knees and pelvis: no complicating features or acute findings     Patient Stated Goals  be able to go to gym without pain to lose weight    Currently in Pain?  No/denies    Pain Location  Hip    Pain Orientation  Left    Pain Descriptors / Indicators  Sharp;Dull    Pain Type  Chronic pain    Aggravating Factors   getting into SUV, supine hip flexion, stairs    Pain Relieving Factors  none         [facility] PT Assessment - 10/30/17 1545      Assessment   Medical Diagnosis  Bilateral hip pain, chronic pain of B knees    Referring Provider  Jean Rosenthal, MD    Onset Date/Surgical Date  10/30/16    Next MD Visit  12/13/17    Prior Therapy  yes      Precautions   Precautions  --   lumbar fusion L1-S1, B THA, B TKA, COPD, [place] stimulator     Restrictions   Weight Bearing Restrictions  No      Balance Screen   Has the patient fallen in the  past 6 months  Yes    How many times?  2   caught toe on threshold, walking while holding something   Has the patient had a decrease in activity level because of a fear of falling?   Yes  Is the patient reluctant to leave their home because of a fear of falling?   No      Home Film/video editor residence    Living Arrangements  Spouse/significant other    Available Help at Discharge  Family    Type of [place] to enter    Entrance Stairs-Number of Steps  1    [place]  One level    [place] - single point;Walker - 2 wheels      Prior Function   Level of Independence  Independent    Vocation  Retired    Leisure  gym      Cognition   Overall Cognitive Status  Within Functional Limits for tasks assessed      Sensation   Light Touch  Appears Intact   reports neuropathy in B feet     Coordination   Gross Motor Movements are Fluid and Coordinated  Yes      Posture/Postural Control   Posture/Postural Control  Postural limitations    Postural Limitations  Rounded Shoulders;Posterior pelvic tilt;Weight shift left      ROM / Strength   AROM / PROM / Strength  Strength;AROM      AROM   AROM Assessment Site  Hip    Right/Left Hip  Right;Left    Right Hip Flexion  94    Right Hip External Rotation   30    Right Hip Internal Rotation   15    Left Hip Flexion  85   limited by cramping in L HS   Left Hip External Rotation   30   severe pain in groin   Left Hip Internal Rotation   10      Strength   Strength Assessment Site  Hip;Knee;Ankle    Right/Left Hip  Right;Left    Right Hip Flexion  4+/5    Right Hip ABduction  4+/5    Right Hip ADduction  4+/5    Left Hip Flexion  4-/5    Left Hip ABduction  4+/5    Left Hip ADduction  4+/5    Right/Left Knee  Right;Left    Right Knee Flexion  4/5   thigh cramping   Right Knee Extension  4/5   thigh cramping   Left Knee Flexion   4/5   severe pain in thigh   Left Knee Extension  4/5   severe pain in thigh   Right/Left Ankle  Right;Left    Right Ankle Dorsiflexion  4+/5    Right Ankle Plantar Flexion  4+/5    Left Ankle Dorsiflexion  4-/5    Left Ankle Plantar Flexion  4/5      Flexibility   Soft Tissue Assessment /Muscle Length  yes    Hamstrings  L mod tightness      Ambulation/Gait   Assistive device  Straight cane    Gait Pattern  Step-through pattern;Step-to pattern;Decreased stance time - right;Decreased hip/knee flexion - left;Decreased dorsiflexion - left;Decreased weight shift to left;Antalgic    Ambulation Surface  Level;Indoor    Gait velocity  Decreased                Objective measurements completed on examination: See above findings.              PT Education - 10/30/17 1830    Education Details  prognosis,  POC, HEP    Person(s) Educated  Patient    Methods  Explanation;Demonstration;Tactile cues;Verbal cues;Handout    Comprehension  Returned demonstration;Verbalized understanding       PT Short Term Goals - 10/30/17 1841      PT SHORT TERM GOAL #1   Title  Patient to be independent with initial HEP.    Time  3    Period  Weeks    Status  New    Target Date  11/20/17        PT Long Term Goals - 10/30/17 1841      PT LONG TERM GOAL #1   Title  Patient to be independent with advanced HEP.    Time  6    Period  Weeks    Status  New    Target Date  12/11/17      PT LONG TERM GOAL #2   Title  Patient to demonstrate [facility] and pain-free L hip AROM.    Time  6    Period  Weeks    Status  New    Target Date  12/11/17      PT LONG TERM GOAL #3   Title  Patient to demonstrate >=4+/5 strength in B LEs.    Time  6    Period  Weeks    Status  New    Target Date  12/11/17      PT LONG TERM GOAL #4   Title  Patient to report <1/10 pain in L hip when transfering into car.     Time  6    Period  Weeks    Status  New    Target Date  12/11/17      PT LONG  TERM GOAL #5   Title  Patient to demonstrate stair climbing up/down 13 steps with 1 handrail with good mechanics and no instability.     Time  6    Period  Weeks    Status  New    Target Date  12/11/17             Plan - 10/30/17 1831    Clinical Impression Statement  Patient is a 67y/o M with hx of B THA, B TKA, and lumbar fusion L1-S1 presenting to OPPT with c/o L groin and anterior thigh pain of 1 year duration. Patient initially reporting L "knee" pain however indicating that pain is moreso in the anterior thigh and radiating to L groin with activities like getting into his SUV, supine hip flexion, and stairs. Patient today with limited and painful hip AROM, decreased strength, decreased flexibility, gait deviations. Also reporting apprehension and fear with daily activities d/t unsteadiness and not enjoying physical activity. Patient today with significantly limited tolerance of supine positioning secondary to back pain. Offered patient ice/heat pack but patient declined. Educated on and administered handout on gentle HEP stretching and strengthening. Patient reported understanding. Would benefit from skilled PT services 1x/week for 6 weeks to address aforementioned impairments.     Clinical Presentation  Stable    Clinical Decision Making  Low    Rehab Potential  Good    PT Frequency  1x / week    PT Duration  6 weeks    PT Treatment/Interventions  ADLs/Self Care Home Management;Cryotherapy;Moist Heat;DME Instruction;Gait training;Stair training;Functional mobility training;Therapeutic activities;Therapeutic exercise;Manual techniques;Orthotic Fit/Training;Patient/family education;Neuromuscular re-education;Balance training;Scar mobilization;Passive range of motion;Dry needling;Energy conservation;Splinting;Taping    PT Next Visit Plan  reassess HEP    Consulted and Agree with Plan  of Care  Patient       Patient will benefit from skilled therapeutic intervention in order to improve  the following deficits and impairments:  Decreased endurance, Decreased activity tolerance, Decreased strength, Pain, Decreased balance, Decreased mobility, Difficulty walking, Decreased range of motion, Improper body mechanics, Impaired flexibility  Visit Diagnosis: Pain in left hip  Stiffness of left hip, not elsewhere classified  Muscle weakness (generalized)  Difficulty in walking, not elsewhere classified     Problem List Patient Active Problem List   Diagnosis Date Noted  . Cellulitis of left hand 05/02/2017  . Left wrist pain 05/01/2017  . Polyethylene liner wear following total knee arthroplasty requiring isolated polyethylene liner exchange ([place]) 11/04/2016  . Leg hematoma, left, initial encounter 11/02/2016  . OSA (obstructive sleep apnea) 02/02/2016  . Osteoarthritis of left hip 12/11/2015  . Status post left hip replacement 12/11/2015  . Bradycardia 10/28/2015  . Morbid obesity ([place]) 10/26/2015  . Pre-op evaluation 04/16/2015  . Osteoarthritis of right hip 08/08/2014  . Status post total replacement of right hip 08/08/2014  . 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 Edema, peripheral 10/29/2013  . Insufficiency, vascular 10/29/2013  . Cervical osteoarthritis 09/13/2013  . Cervical pain 09/05/2013  . 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 Benign prostatic hypertrophy without urinary obstruction 04/04/2013  . Restless leg 04/04/2013  . Diabetes mellitus, type 2 ([place]) 04/04/2013  . 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 Anxiety 12/20/2012  . DDD (degenerative disc disease), lumbosacral 12/20/2012  . 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 Varicose veins of both legs with edema 10/25/2012  . Acquired spondylolisthesis 08/23/2012  . Chronic pain 08/11/2012  . Excessive urination at night 08/11/2012  . RA (rheumatoid arthritis) ([place])   . Chronic venous insufficiency   . COPD gold stage C.   . Obesity   . Hypertension   . Chronic allergic rhinitis     Janene Harvey, PT,  DPT 10/30/17 6:45 PM   [facility] [address]  [place], [place], [address] Phone: [phone]   Fax:  [phone]  Name: Anthony Friedman MRN: [id number] Date of Birth: 01-Mar-1951   PHYSICAL THERAPY DISCHARGE SUMMARY  Visits from Start of Care: 1  Current functional level related to goals / functional outcomes: See above clinical impression; patient did not return after eval   Remaining deficits: See above   Education / Equipment: HEP  Plan: Patient agrees to discharge.  Patient goals were not met. Patient is being discharged due to not returning since the last visit.  ?????     Janene Harvey, PT, DPT 12/06/17 3:13 PM

## 2017-11-07 ENCOUNTER — Ambulatory Visit: Payer: Medicare HMO | Admitting: Physical Therapy

## 2017-11-17 ENCOUNTER — Ambulatory Visit: Payer: Medicare HMO

## 2017-11-23 ENCOUNTER — Ambulatory Visit: Payer: Medicare HMO

## 2017-12-01 ENCOUNTER — Encounter (INDEPENDENT_AMBULATORY_CARE_PROVIDER_SITE_OTHER): Payer: Self-pay | Admitting: Family Medicine

## 2017-12-01 ENCOUNTER — Ambulatory Visit (INDEPENDENT_AMBULATORY_CARE_PROVIDER_SITE_OTHER): Payer: Medicare HMO

## 2017-12-01 ENCOUNTER — Ambulatory Visit (INDEPENDENT_AMBULATORY_CARE_PROVIDER_SITE_OTHER): Payer: Medicare HMO | Admitting: Family Medicine

## 2017-12-01 DIAGNOSIS — M25511 Pain in right shoulder: Secondary | ICD-10-CM

## 2017-12-01 MED ORDER — LIDOCAINE 5 % EX PTCH: 1.0000 | MEDICATED_PATCH | CUTANEOUS | 6 refills | Status: DC

## 2017-12-01 NOTE — Progress Notes (Signed)
Office Visit Note   Patient: Anthony Friedman           Date of Birth: 11-Apr-1950           MRN: [id number] Visit Date: 12/01/2017 Requested by: Herma Carson, MD [address] [place], [place] [address] PCP: Herma Carson, MD  Subjective: Chief Complaint  Patient presents with  . Right Shoulder - Pain    HPI: He is a 67 year old right-hand-dominant male with right shoulder pain.  He works part-time as an Biomedical scientist.  The other day he was lifting a heavy piece of luggage and felt sudden severe pain in the shoulder.  He has not quit hurting since then, pain to reach overhead.  Using lidocaine patches with temporary relief.  He has had some minor aches and pains in the shoulder over the years but never anything this severe.  He does have a history of rheumatoid arthritis.              ROS: He has hypertension, COPD, spinal cord stimulator and multiple other medical problems.  Objective: Vital Signs: There were no vitals taken for this visit.  Physical Exam:  Right shoulder: No tenderness at the [facility] joint or the long head biceps tendon.  Very painful and has decreased range of motion with active abduction.  Good strength with internal/external rotation against resistance, speeds test is negative.  Imaging: 3 view right shoulder x-rays: Moderate glenohumeral DJD, moderate AC joint DJD, calcification at the greater tuberosity of the humerus.  No obvious fracture, no sign of dislocation.  Alignment is anatomic.  MSK-US: Exam was difficult due to body habitus.  Biceps tendon was not well visualized.  Supraspinatus tendon has a partial insertional tear of the at the greater tuberosity, but complete visualization was limited by calcifications which were seen on x-ray.  Infraspinatus and subscapularis looked intact.     Assessment & Plan: 1.  Acute right shoulder pain, concerning for at least partial supraspinatus tear.  Underlying glenohumeral arthritis. -Shoulder sling for comfort,  remove it daily to work on range of motion.  He already has an appointment scheduled with Dr. Magnus Ivan in a couple weeks.  If not improving, could consider subacromial injection.  He cannot have MRI scan due to spinal cord stimulator.   Follow-Up Instructions: No follow-ups on file.       Procedures: None today.   PMFS History: Patient Active Problem List   Diagnosis Date Noted  . Cellulitis of left hand 05/02/2017  . Left wrist pain 05/01/2017  . 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 Leg hematoma, left, initial encounter 11/02/2016  . OSA (obstructive sleep apnea) 02/02/2016  . Osteoarthritis of left hip 12/11/2015  . Status post left hip replacement 12/11/2015  . Bradycardia 10/28/2015  . Morbid obesity ([facility]) 10/26/2015  . Pre-op evaluation 04/16/2015  . Osteoarthritis of right hip 08/08/2014  . Status post total replacement of right hip 08/08/2014  . 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 Edema, peripheral 10/29/2013  . Insufficiency, vascular 10/29/2013  . Cervical osteoarthritis 09/13/2013  . Cervical pain 09/05/2013  . 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 Benign prostatic hypertrophy without urinary obstruction 04/04/2013  . Restless leg 04/04/2013  . Diabetes mellitus, type 2 (HCC) 04/04/2013  . 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 Anxiety 12/20/2012  . DDD (degenerative disc disease), lumbosacral 12/20/2012  . 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 Varicose veins of both legs with edema  10/25/2012  . Acquired spondylolisthesis 08/23/2012  . Chronic pain 08/11/2012  . Excessive urination at night 08/11/2012  . RA (rheumatoid arthritis) ([facility])   . Chronic venous insufficiency   . COPD gold stage C.   . Obesity   . Hypertension   . Chronic allergic rhinitis    Past Medical History:  Diagnosis Date  . Atelectasis   . Bradycardia    10/2015 following bariatric surgery on atenolol -->  discontinued   . Chronic allergic rhinitis   . Chronic venous insufficiency    superficial  . COPD (chronic obstructive pulmonary disease) (HCC)   . Cough   . Diabetes mellitus without complication ([facility])    not on medications since  surgery 10/2015   . Edema    lower extremities  . Elevated liver enzymes    d/t arava  . Family history of adverse reaction to anesthesia    pts mother had difficulty with anesthesia - pt not sure what difficulties were   . Hypertension    no longer on blood pressure medications since 10/25/2015 per patient   . Kidney stones    hx of   . Leg pain   . Liver enlargement    d/t arava  . Obesity   . Pallor   . Pneumonia    hx of times 3; pt states is current with pneumonia vaccine  . RA (rheumatoid arthritis) ([facility])     rheumatoid arthritis   . Sleep apnea    say he uses a mouth guard that incremently increases airflow   . SOBOE (shortness of breath on exertion)   . Superficial thrombophlebitis    subacute-on anticoagulation  . Urinary frequency   . Urinary tract bacterial infections    hx of   . Venous stasis    changes    Family History  Problem Relation Age of Onset  . Heart attack Father   . Esophageal cancer Mother   . Other Mother        Tachycardia  . Diabetes Brother     Past Surgical History:  Procedure Laterality Date  . HAND SURGERY  1965   right hand  . I&D EXTREMITY Left 05/03/2017   Procedure: IRRIGATION AND DEBRIDEMENT EXTREMITY;  Surgeon: Roby Lofts, MD;  [facility];  Service: Orthopedics;  Laterality: Left;  . I&D KNEE WITH POLY EXCHANGE Left 11/04/2016   Procedure: LEFT KNEE POLY EXCHANGE;  Surgeon: Kathryne Hitch, MD;  [facility];  Service: Orthopedics;  Laterality: Left;  . KNEE SURGERY     arthroscopic right and left knee  . LAMINECTOMY  1988   and fusion; L4, L5, S1  . LAMINECTOMY  2007   and fusion L1, L2, L3  . LAPAROSCOPIC GASTRIC SLEEVE RESECTION N/A 10/26/2015   Procedure: LAPAROSCOPIC  GASTRIC SLEEVE RESECTION, UPPER ENDO;  Surgeon: Ovidio Kin, MD;  [facility];  Service: General;  Laterality: N/A;  . OTHER SURGICAL HISTORY     polynidal cyst  . REPLACEMENT TOTAL KNEE  2009   left 2009; right 2014  . SPINAL CORD STIMULATOR IMPLANT     to control back pain   . TOTAL HIP ARTHROPLASTY Right 08/08/2014   Procedure: RIGHT TOTAL HIP ARTHROPLASTY ANTERIOR APPROACH;  Surgeon: Kathryne Hitch, MD;  [facility];  Service: Orthopedics;  Laterality: Right;  . TOTAL HIP ARTHROPLASTY Left 12/11/2015   Procedure: LEFT TOTAL HIP ARTHROPLASTY ANTERIOR APPROACH;  Surgeon: Kathryne Hitch, MD;  [facility];  Service:  Orthopedics;  Laterality: Left;  . UMBILICAL HERNIA REPAIR     Social History   Occupational History  . Occupation: disabled     Comment: former Chief Financial Officer, office work   Tobacco Use  . Smoking status: Former Smoker    Packs/day: 2.00    Years: 45.00    Pack years: 90.00    Types: Cigarettes    Last attempt to quit: 07/19/2017    Years since quitting: 0.3  . Smokeless tobacco: Never Used  Substance and Sexual Activity  . Alcohol use: No    Alcohol/week: 0.0 standard drinks    Comment: recovering alcoholic - none since 2012   . Drug use: No    Comment: hx of marijuana in teenage years  . Sexual activity: Not on file

## 2017-12-02 ENCOUNTER — Encounter (INDEPENDENT_AMBULATORY_CARE_PROVIDER_SITE_OTHER): Payer: Self-pay | Admitting: Family Medicine

## 2017-12-04 NOTE — Telephone Encounter (Signed)
Patient called to see if anyone has worked on prior authorization for lidocaine patch? He said this was sent over last week. Please advise # [phone]

## 2017-12-06 ENCOUNTER — Telehealth (INDEPENDENT_AMBULATORY_CARE_PROVIDER_SITE_OTHER): Payer: Self-pay

## 2017-12-06 ENCOUNTER — Other Ambulatory Visit (INDEPENDENT_AMBULATORY_CARE_PROVIDER_SITE_OTHER): Payer: Self-pay | Admitting: Family Medicine

## 2017-12-06 MED ORDER — DICLOFENAC SODIUM 1 % TD GEL: 4.0000 g | Freq: Four times a day (QID) | TRANSDERMAL | 6 refills | Status: DC | PRN

## 2017-12-06 NOTE — Telephone Encounter (Signed)
Patient was denied for Lidoderm patch for right shoulder pain through [employer].  Please advise on next step.  Thank You.

## 2017-12-06 NOTE — Telephone Encounter (Signed)
Will try for Voltaren Gel instead.

## 2017-12-06 NOTE — Telephone Encounter (Signed)
Noted. Thank You.

## 2017-12-13 ENCOUNTER — Ambulatory Visit (INDEPENDENT_AMBULATORY_CARE_PROVIDER_SITE_OTHER): Payer: Medicare HMO | Admitting: Orthopaedic Surgery

## 2017-12-13 ENCOUNTER — Encounter (INDEPENDENT_AMBULATORY_CARE_PROVIDER_SITE_OTHER): Payer: Self-pay | Admitting: Orthopaedic Surgery

## 2017-12-13 DIAGNOSIS — R29898 Other symptoms and signs involving the musculoskeletal system: Secondary | ICD-10-CM | POA: Diagnosis not present

## 2017-12-13 DIAGNOSIS — M25511 Pain in right shoulder: Secondary | ICD-10-CM | POA: Diagnosis not present

## 2017-12-13 MED ORDER — LIDOCAINE 5 % EX PTCH: 1.0000 | MEDICATED_PATCH | CUTANEOUS | 2 refills | Status: DC

## 2017-12-13 NOTE — Progress Notes (Signed)
HPI: Mr. Strausser returns today follow-up of his left lower leg decreased strength and weakness.  He went to physical therapy for 1 visit and states that he was given a large what sounds like medicine ball to do exercises with him this caused him to have significant back pain for couple weeks.  His back pain is now resolved.  He states he would just like to work on strengthening of his legs on his own.  He did not return to therapy after that first appointment.  In the interim he is been seen by Dr. Prince Rome for right shoulder pain that was acute injury.  He reports that he was lifting a heavy piece of luggage as he is able to drive her and had severe pain in his shoulder after lifting this.  He was seen and evaluated by Dr. Prince Rome who placed him in a sling for comfort and was to work on range of motion.  He states that the right shoulder pain is improved.  Has some minor discomfort in the shoulder.  Physical exam bilateral shoulders: 5 out of 5 strength with external and internal rotation against resistance empty can test negative.  Negative impingement testing.  He has full forward flexion bilateral shoulders.  Liftoff test is negative bilaterally.  Impression: Left lower leg weakness Right shoulder acute pain  Plan: This point time patient states that he does not have enough pain in the shoulder that he would want a subacromial injection.  He will see how things go with the shoulder and let us know.  In regards to his left lower leg he wants to work on strengthening on his own did discuss some low back impact activities that he could use to strengthen the legs.  He will follow-up with [place] in 2 weeks mainly to see how his shoulder is doing but if he is doing well overall he can cancel the appointment.  Questions were encouraged and answered at length.

## 2017-12-26 ENCOUNTER — Telehealth: Payer: Self-pay | Admitting: Pulmonary Disease

## 2017-12-26 MED ORDER — FLUTICASONE FUROATE-VILANTEROL 100-25 MCG/INH IN AEPB: 1.0000 | INHALATION_SPRAY | Freq: Every day | RESPIRATORY_TRACT | 0 refills | Status: DC

## 2017-12-26 NOTE — Telephone Encounter (Signed)
Left detailed message for patient stating that I have placed 2 samples up front for him.

## 2017-12-27 ENCOUNTER — Telehealth: Payer: Self-pay | Admitting: Pulmonary Disease

## 2017-12-27 ENCOUNTER — Ambulatory Visit (INDEPENDENT_AMBULATORY_CARE_PROVIDER_SITE_OTHER): Payer: Medicare HMO | Admitting: Orthopaedic Surgery

## 2017-12-27 MED ORDER — TRAZODONE HCL 50 MG PO TABS: 50.0000 mg | ORAL_TABLET | Freq: Every day | ORAL | 0 refills | Status: DC

## 2017-12-27 MED ORDER — TRAZODONE HCL 100 MG PO TABS: 100.0000 mg | ORAL_TABLET | Freq: Every day | ORAL | 0 refills | Status: DC

## 2017-12-27 NOTE — Telephone Encounter (Signed)
Spoke with pt, advised him that I sent in a 90 day supply to [facility] mail order pharmacy. Pt understood and nothing further is needed.

## 2017-12-27 NOTE — Telephone Encounter (Signed)
Called and spoke with patient he is requesting a 90 day supply of Trazadone.   BQ please advise, thank you.

## 2017-12-27 NOTE — Telephone Encounter (Signed)
OK to refill

## 2018-01-16 ENCOUNTER — Other Ambulatory Visit: Payer: Self-pay | Admitting: Pulmonary Disease

## 2018-01-16 NOTE — Telephone Encounter (Signed)
Pt requesting refill on Trazodone 100mg  qhs.

## 2018-02-12 ENCOUNTER — Telehealth: Payer: Self-pay | Admitting: Pulmonary Disease

## 2018-02-12 ENCOUNTER — Other Ambulatory Visit: Payer: Self-pay | Admitting: Pulmonary Disease

## 2018-02-12 MED ORDER — TRAZODONE HCL 100 MG PO TABS: 100.0000 mg | ORAL_TABLET | Freq: Every day | ORAL | 1 refills | Status: DC

## 2018-02-12 NOTE — Telephone Encounter (Signed)
Unfortunately we have no Breo samples in the office at this time Called spoke with patient, he is aware Nothing further needed; will sign off

## 2018-02-19 ENCOUNTER — Telehealth: Payer: Self-pay | Admitting: Pulmonary Disease

## 2018-02-19 NOTE — Telephone Encounter (Signed)
Called and spoke with patient advised that we do not have any samples at this time . Nothing further needed.

## 2018-02-22 ENCOUNTER — Telehealth: Payer: Self-pay | Admitting: Pulmonary Disease

## 2018-02-22 MED ORDER — FLUTICASONE FUROATE-VILANTEROL 100-25 MCG/INH IN AEPB: 1.0000 | INHALATION_SPRAY | Freq: Every day | RESPIRATORY_TRACT | 0 refills | Status: DC

## 2018-02-22 NOTE — Telephone Encounter (Signed)
Called and spoke with pt letting him know that we were going to send Rx Breo to pharmacy for him. Pt expressed understanding. I verified pt's preferred pharmacy and sent Rx in. Nothing further needed.

## 2018-02-22 NOTE — Telephone Encounter (Signed)
OK to refill Virgel Bouquet

## 2018-02-22 NOTE — Telephone Encounter (Signed)
Pt seen by BQ 08/30/17 and told to follow up in 6 months from that OV. Pt had sleep consult by VS due to referral by BQ and at that visit, VS stated for pt to follow up with BQ.  Pt has not scheduled another f/u. Pt is in need of a refill of Breo as he is out and is about to be going out of town for 10 days. Dr. Kendrick Fries, please advise if it is okay to refill this med for pt.Thanks!

## 2018-03-01 IMAGING — RF DG UGI W/ KUB
14 series · 15 of 15 positions shown · non-contrast
Comparison: None.

CLINICAL DATA: Pre

EXAM:
UPPER GI SERIES WITH KUB
TECHNIQUE: After obtaining a scout radiograph a routine upper GI series was
performed using thin barium
FLUOROSCOPY TIME:  Radiation Exposure Index (as provided by the
fluoroscopic device): 57.9 mGy

[Series 1: t abdomen supine · 0.15mm/px · 1 of 1 slices shown]
[im 1/1]
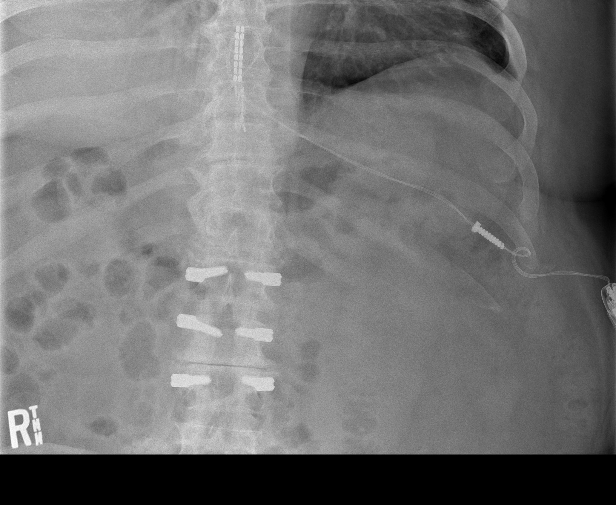

[Series 2: cp_standard · 0.26mm/px · 1 of 1 slices shown (1 of 9)]
[im 1/1]
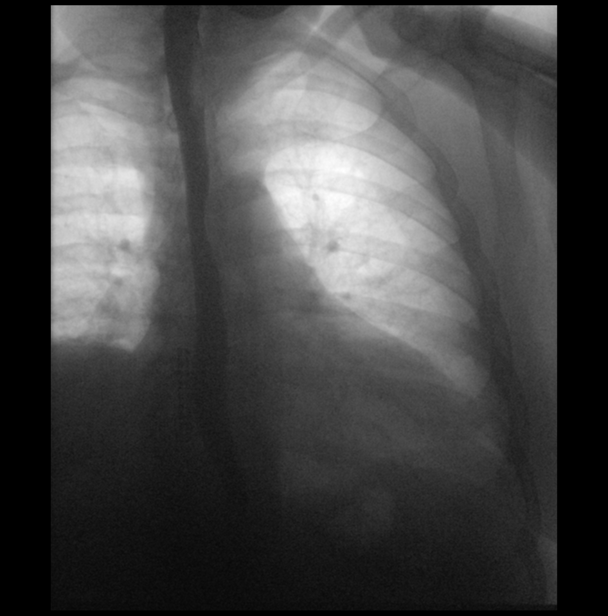

[Series 3: cp_standard · 0.26mm/px · 1 of 1 slices shown (2 of 9)]
[im 1/1]
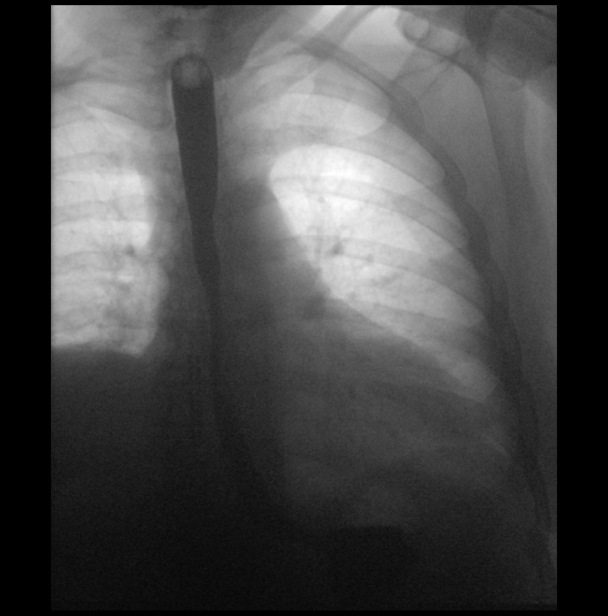

[Series 4: cp_standard · 0.17mm/px · 1 of 1 slices shown (3 of 9)]
[im 1/1]
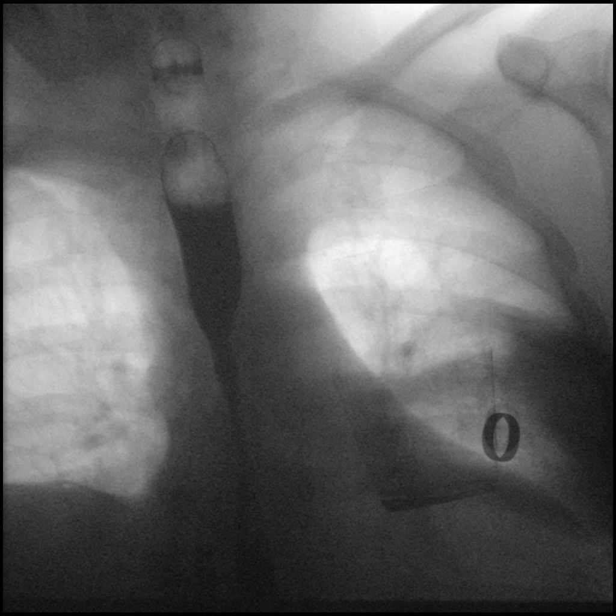

[Series 5: cp_standard · 0.17mm/px · 1 of 1 slices shown (4 of 9)]
[im 1/1]
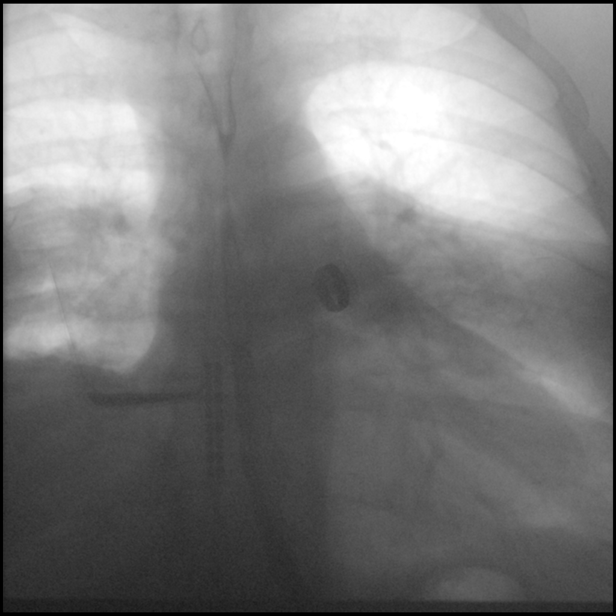

[Series 6: cp_standard · 0.17mm/px · 1 of 1 slices shown (5 of 9)]
[im 1/1]
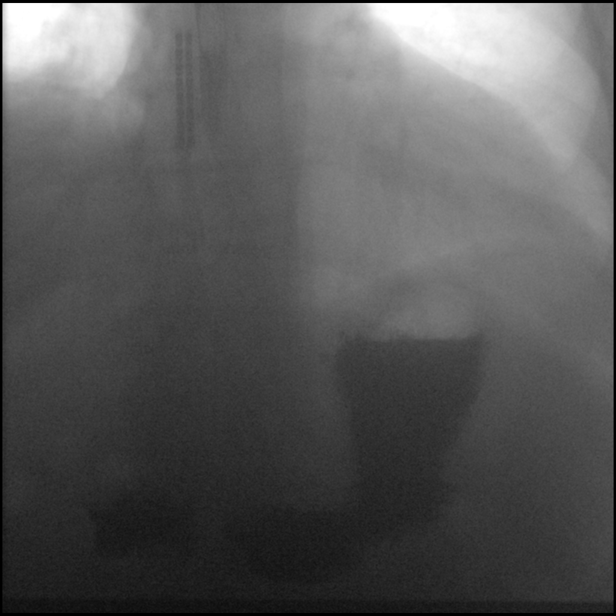

[Series 7: cp_standard · 0.17mm/px · 1 of 1 slices shown (6 of 9)]
[im 1/1]
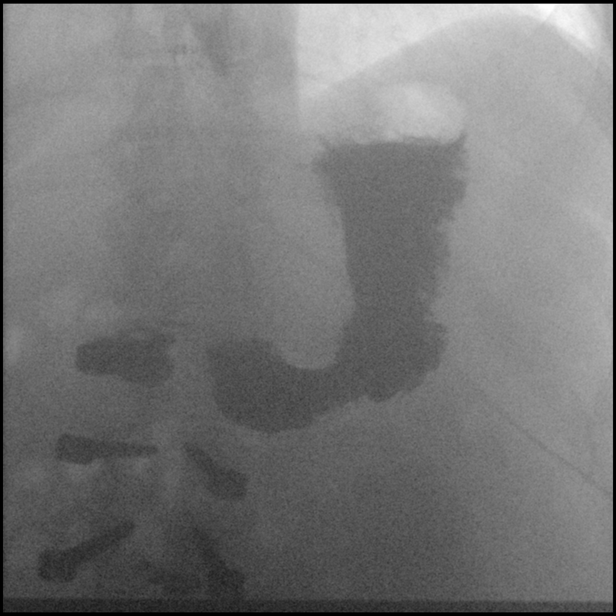

[Series 8: fluoro_barium 2fps_bw · 0.18mm/px · 2 of 2 frames shown (1 of 4)]
[frame 1/2]
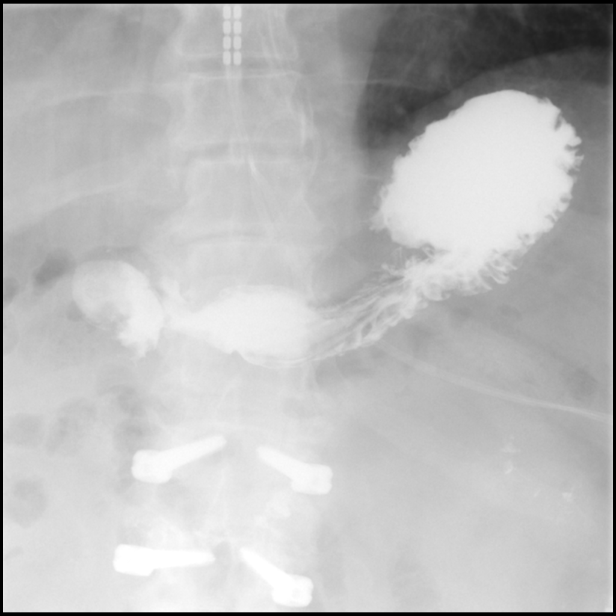
[frame 2/2]
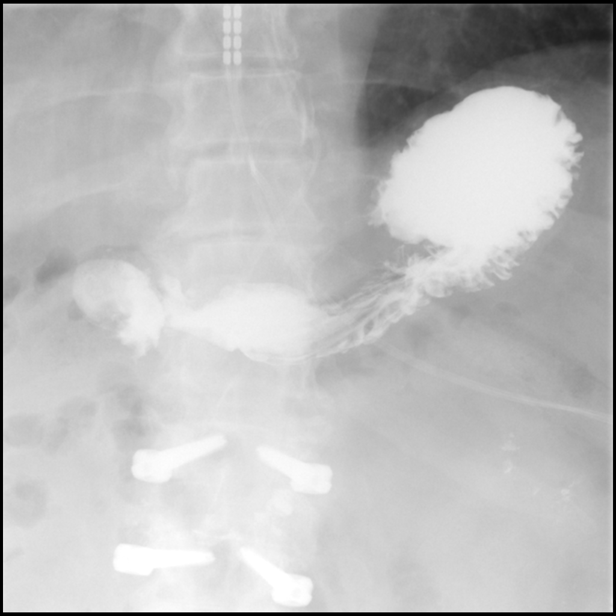

[Series 9: fluoro_barium 2fps_bw · 0.19mm/px · 1 of 1 slices shown (2 of 4)]
[im 1/1]
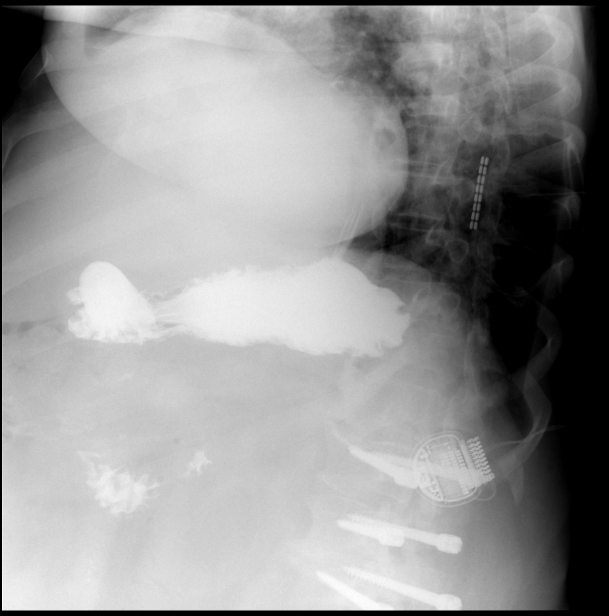

[Series 10: fluoro_barium 2fps_bw · 0.19mm/px · 1 of 1 slices shown (3 of 4)]
[im 1/1]
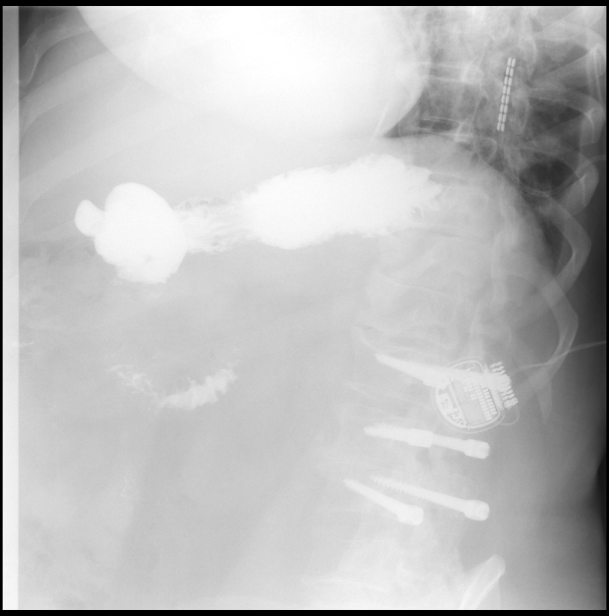

[Series 11: cp_standard · 0.29mm/px · 1 of 1 slices shown (7 of 9)]
[im 1/1]
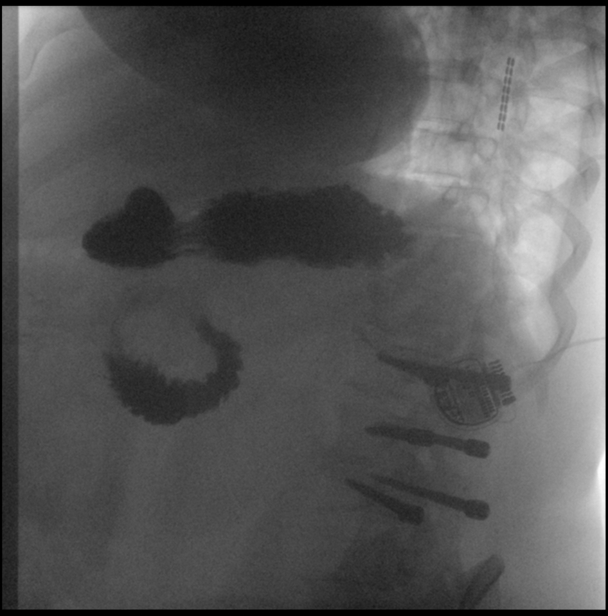

[Series 12: fluoro_barium 2fps_bw · 0.20mm/px · 1 of 1 slices shown (4 of 4)]
[im 1/1]
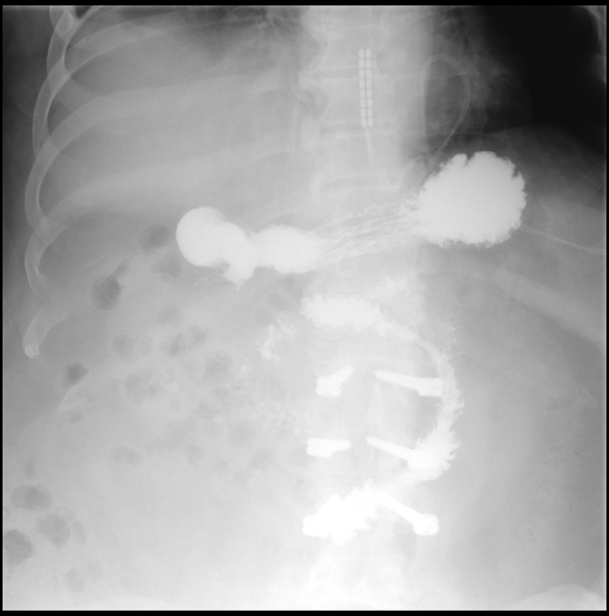

[Series 13: cp_standard · 0.29mm/px · 1 of 1 slices shown (8 of 9)]
[im 1/1]
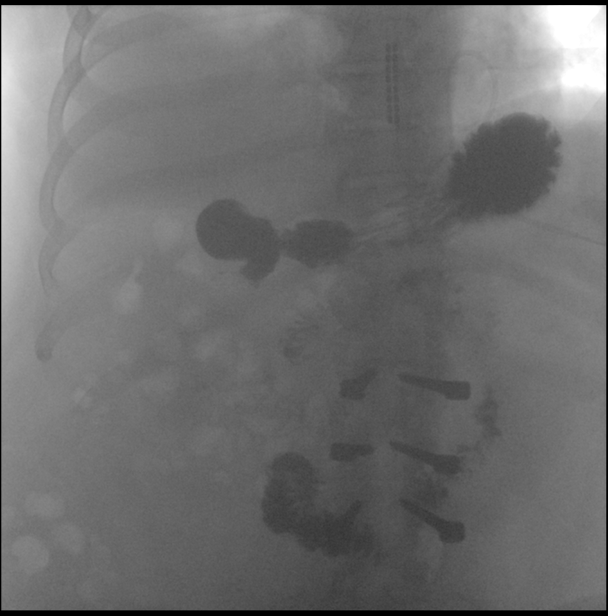

[Series 14: cp_standard · 0.20mm/px · 1 of 1 slices shown (9 of 9)]
[im 1/1]
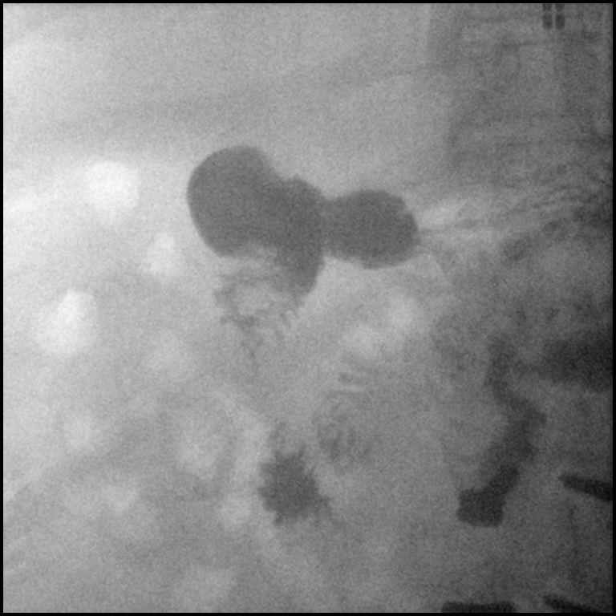

[15 of 15 positions shown; findings below may reference images not displayed]

FINDINGS: On the scout radiograph there is a spinal stimulator with leads
projecting over the lower thoracic spine. Postsurgical changes
within the lumbar spine noted.

The esophagus appears normal. The stomach, duodenal bulb and sweep
have a normal anatomy.

No hiatal hernia identified.  No reflux.
IMPRESSION: 1. Unremarkable upper GI.

## 2018-03-21 ENCOUNTER — Telehealth: Payer: Self-pay | Admitting: Pulmonary Disease

## 2018-03-21 DIAGNOSIS — G4733 Obstructive sleep apnea (adult) (pediatric): Secondary | ICD-10-CM

## 2018-03-21 MED ORDER — FLUTICASONE FUROATE-VILANTEROL 100-25 MCG/INH IN AEPB: 1.0000 | INHALATION_SPRAY | Freq: Every day | RESPIRATORY_TRACT | 0 refills | Status: DC

## 2018-03-21 NOTE — Telephone Encounter (Signed)
Called and spoke with patient.  Dr. Craige Cotta recommendations given.  Patient requested Dr. Irene Limbo.  Referral for orthodontist placed. Nothing further at this time.   Per VS-   There is Dr. Irene Limbo and Dr. Althea Grimmer in [place].  Dr. Toni Arthurs does general dentistry in addition to oral appliance.  Dr. Myrtis Ser is an orthodontist that does oral appliances.  His preference on who he would like to be referred to

## 2018-03-21 NOTE — Telephone Encounter (Signed)
Call made to patient, patient states when he saw Dr. Craige Cotta they discussed him switching to a dental appliance for his cpap. He states his dental appliance is beginning to deteriorate and he has had a couple of teeth pulled. He states the top portion does not fit well anymore. He would like this referral to a dentist. His previous dentist was Reatha Armour in [place]. I informed patient he was advised to f/u with his dentist he states he would like a new one.   VS please advise, patient states you had someone specific you wanted him to see for his dental appliance? Per last AVS "advised him to f/u with his dentist.

## 2018-03-21 NOTE — Telephone Encounter (Signed)
There is Dr. Irene Limbo and Dr. Althea Grimmer in [place].  Dr. Toni Arthurs does general dentistry in addition to oral appliance.  Dr. Myrtis Ser is an orthodontist that does oral appliances.  His preference on who he would like to be referred to.

## 2018-03-21 NOTE — Telephone Encounter (Signed)
Call made to patient. Patient requesting samples of Breo. Patient states he will come by and pick them up. Aware sample has been placed upfront. Nothing further is needed at this time.

## 2018-03-30 ENCOUNTER — Telehealth: Payer: Self-pay | Admitting: Pulmonary Disease

## 2018-03-30 NOTE — Telephone Encounter (Signed)
lmtcb x1 for pt. 

## 2018-04-02 NOTE — Telephone Encounter (Signed)
Called and spoke with Patient. He was unaware that a prescription had been set to pharmacy. Per epic-02/12/18, trazodone 100mg , #90 with 1 refill was sent to [facility] on [address]. Patient stated that he would call pharmacy.  Nothing further at this time.

## 2018-05-04 ENCOUNTER — Telehealth: Payer: Self-pay | Admitting: Pulmonary Disease

## 2018-05-04 MED ORDER — FLUTICASONE FUROATE-VILANTEROL 100-25 MCG/INH IN AEPB: 1.0000 | INHALATION_SPRAY | Freq: Every day | RESPIRATORY_TRACT | 0 refills | Status: DC

## 2018-05-04 NOTE — Telephone Encounter (Signed)
Called and spoke with Patient.  OV with Dr Kendrick Fries, scheduled for 05/08/18, at 1:30pm.  Patient requested Breo 100 sample.  Samples placed at front desk for Patient pick up. Nothing further at this time.

## 2018-05-08 ENCOUNTER — Encounter: Payer: Self-pay | Admitting: Pulmonary Disease

## 2018-05-08 ENCOUNTER — Ambulatory Visit (INDEPENDENT_AMBULATORY_CARE_PROVIDER_SITE_OTHER): Payer: Medicare HMO | Admitting: Pulmonary Disease

## 2018-05-08 VITALS — BP 118/60 | HR 47 | Ht 69.0 in | Wt 288.0 lb

## 2018-05-08 DIAGNOSIS — J449 Chronic obstructive pulmonary disease, unspecified: Secondary | ICD-10-CM | POA: Diagnosis not present

## 2018-05-08 DIAGNOSIS — G4733 Obstructive sleep apnea (adult) (pediatric): Secondary | ICD-10-CM | POA: Diagnosis not present

## 2018-05-08 NOTE — Progress Notes (Signed)
Subjective:    Patient ID: Anthony Friedman, male    DOB: 09-24-1950, 68 y.o.   MRN: [id number]  Synopsis: Former patient of Dr. Delford FieldWright listed as having COPD who first established care with Anthony Friedman in 08/2015.  He quit smoking after 40 years of smoking as much as 2ppd in 2012, he briefly smoked again in 2015. Anthony Friedman.  OSA> doesn't like CPAP, uses oral appliance   HPI Chief Complaint  Patient presents with  . Follow-up    6 month follow up for COPD.    Anthony Friedman has seen Dr. Littie DeedsGentry [place] [place] [place] [place] to help manage his oral appliance for sleep apnea.  He had the device adjusted yesterday.  He expectes to use it tomorrow.  No problems with his breathing lately.   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    Past Medical History:  Diagnosis Date  . Atelectasis   . 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 Chronic allergic rhinitis   . Chronic venous insufficiency    superficial  . COPD (chronic obstructive pulmonary disease) (HCC)   . Cough   . Diabetes mellitus without complication (HCC)    not on medications since  surgery 10/2015   . Edema    lower extremities  . Elevated liver enzymes    d/t arava  . Family history of adverse reaction to anesthesia    pts mother had difficulty with anesthesia - pt not sure what difficulties were   . Hypertension    no longer on blood pressure medications since 10/25/2015 per patient   . Kidney stones    hx of   . Leg pain   . Liver enlargement    d/t arava  . Obesity   . Pallor   . Pneumonia    hx of times 3; pt states is current with pneumonia vaccine  . RA (rheumatoid arthritis) (HCC)     rheumatoid arthritis   . Sleep apnea    say he uses a mouth guard that incremently increases airflow   . SOBOE (shortness of breath on exertion)   . Superficial thrombophlebitis    subacute-on anticoagulation  . Urinary frequency   . Urinary tract bacterial infections    hx of   . Venous  stasis    changes      Review of Systems  Constitutional: Negative for chills, fatigue and fever.  HENT: Negative for postnasal drip, rhinorrhea and sinus pressure.   Respiratory: Positive for shortness of breath. Negative for cough and wheezing.   Cardiovascular: Negative for chest pain, palpitations and leg swelling.       Objective:   Physical Exam Vitals:   05/08/18 1331  BP: 118/60  Pulse: (!) 47  SpO2: 98%  Weight: 288 lb (130.6 kg)  Height: 5\' 9"  (1.753 m)   RA  Gen: morbidly obese, no distress HENT: OP clear, TM's clear, neck supple PULM: CTA B, normal percussion CV: RRR, no mgr, trace edema GI: BS+, soft, nontender Derm: no cyanosis or rash Psyche: normal mood and affect   PFT Spirometry 10/25/2012: FEV1 51% predicted FVC 49% predicted FEV1 FVC ratio 82% predicted FEF 25-75   63% predicted Spirometry 04/2015 > no airflow obstruction, Ratio 72%, FEV1 1.48 42%, FVC 45%     Assessment & Plan:    OSA (obstructive sleep apnea)  COPD gold stage C.  Discussion: This has been a stable interval for Anthony Friedman, he has  not had an exacerbation of COPD since the last visit.  He is stable on Breo 1 puff daily.  He practices good hand hygiene and is quite careful about avoiding people who are sick.  He had his oral appliance for sleep apnea adjusted by his dentist yesterday and he supposed to be able to pick it up tomorrow.  Plan: Obstructive sleep apnea: Keep using the oral appliance when you sleep Continue follow-up with your dentist regarding this No driving while sleepy Continue to make efforts at weight loss  COPD: Continue Breo 1 puff daily Use albuterol as needed for chest tightness wheezing or shortness of breath Practice good hand hygiene I am glad your flu shot is up-to-date  Follow-up with [place] in 6 months or sooner if needed  Greater than 50% of this 26-minute visit was spent face-to-face    Current Outpatient Medications:  .  albuterol (PROAIR HFA)  108 (90 Base) MCG/ACT inhaler, Inhale 2 puffs into the lungs every 6 (six) hours as needed for wheezing., Disp: , Rfl:  .  amoxicillin (AMOXIL) 500 MG capsule, TAKE 4 CAPSULES 1 HOUR BEFORE DENTAL PROCEDURE, Disp: , Rfl: 0 .  aspirin EC 81 MG tablet, Take 1 tablet (81 mg total) by mouth daily., Disp: , Rfl:  .  Calcium Carb-Cholecalciferol (CALCIUM 600 + D PO), Take 1 tablet by mouth 3 (three) times daily. , Disp: , Rfl:  .  CHANTIX 1 MG tablet, Take 1 mg by mouth 2 (two) times daily., Disp: , Rfl: 2 .  Cyanocobalamin (B-12) 500 MCG SUBL, Place 500 mcg under the tongue See admin instructions. Take one tablet (500 mcg) sublingually twice weekly - Monday and Thursday, Disp: , Rfl:  .  diclofenac sodium (VOLTAREN) 1 % GEL, Apply 4 g topically 4 (four) times daily as needed., Disp: 500 g, Rfl: 6 .  fluticasone furoate-vilanterol (BREO ELLIPTA) 100-25 MCG/INH AEPB, Inhale 1 puff into the lungs daily., Disp: 30 each, Rfl: 0 .  folic acid (FOLVITE) 400 MCG tablet, Take 800 mcg by mouth daily., Disp: , Rfl:  .  gabapentin (NEURONTIN) 300 MG capsule, Take 300 mg by mouth 3 (three) times daily. , Disp: , Rfl:  .  lidocaine (LIDODERM) 5 %, Place 1 patch onto the skin daily. Remove & Discard patch within 12 hours or as directed by MD, Disp: 30 patch, Rfl: 2 .  Multiple Vitamin (MULTIVITAMIN WITH MINERALS) TABS tablet, Take 1 tablet by mouth 2 (two) times daily., Disp: , Rfl:  .  rOPINIRole (REQUIP) 1 MG tablet, Take 1 mg by mouth 3 (three) times daily as needed (restless legs). , Disp: , Rfl:  .  Tamsulosin HCl (FLOMAX) 0.4 MG CAPS, Take 0.4 mg by mouth daily. , Disp: , Rfl:  .  traZODone (DESYREL) 100 MG tablet, Take 1 tablet (100 mg total) by mouth at bedtime., Disp: 90 tablet, Rfl: 1 .  trospium (SANCTURA) 20 MG tablet, Take 20 mg by mouth 2 (two) times daily., Disp: , Rfl:

## 2018-05-08 NOTE — Patient Instructions (Signed)
Obstructive sleep apnea: Keep using the oral appliance when you sleep Continue follow-up with your dentist regarding this No driving while sleepy Continue to make efforts at weight loss  COPD: Continue Breo 1 puff daily Use albuterol as needed for chest tightness wheezing or shortness of breath Practice good hand hygiene I am glad your flu shot is up-to-date  Follow-up with [place] in 6 months or sooner if needed

## 2018-05-16 ENCOUNTER — Ambulatory Visit: Payer: Medicare HMO | Admitting: Primary Care

## 2018-05-16 ENCOUNTER — Other Ambulatory Visit: Payer: Self-pay

## 2018-05-16 ENCOUNTER — Encounter: Payer: Self-pay | Admitting: Primary Care

## 2018-05-16 VITALS — BP 140/86 | HR 72 | Temp 99.3°F | Ht 69.0 in | Wt 294.2 lb

## 2018-05-16 DIAGNOSIS — R059 Cough, unspecified: Secondary | ICD-10-CM

## 2018-05-16 DIAGNOSIS — R05 Cough: Secondary | ICD-10-CM

## 2018-05-16 DIAGNOSIS — J449 Chronic obstructive pulmonary disease, unspecified: Secondary | ICD-10-CM | POA: Diagnosis not present

## 2018-05-16 LAB — RESPIRATORY VIRUS PANEL
HMPV: DETECTED — AB
INFLUENZA A RNA: NOT DETECTED
INFLUENZA B RNA: NOT DETECTED
RSV RNA: NOT DETECTED

## 2018-05-16 MED ORDER — DOXYCYCLINE HYCLATE 100 MG PO TABS: 100.0000 mg | ORAL_TABLET | Freq: Two times a day (BID) | ORAL | 0 refills | Status: DC

## 2018-05-16 MED ORDER — PREDNISONE 10 MG PO TABS: ORAL_TABLET | ORAL | 0 refills | Status: DC

## 2018-05-16 MED ORDER — HYDROCODONE-HOMATROPINE 5-1.5 MG/5ML PO SYRP: 5.0000 mL | ORAL_SOLUTION | Freq: Every evening | ORAL | 0 refills | Status: DC | PRN

## 2018-05-16 NOTE — Patient Instructions (Signed)
Treating you for COPD exacerbation   Office testing: Respiratory viral panel  RX: Doxycyline 1 tab twice daily x 7 days Prednisone 20mg  x 5 days Hycodan 71ml at bedtime for cough suppression (no refills, do not take with other sedating medication or alcohol)  Recommendations: Mucinex twice daily Delsym two tsp every 12 hours   Follow-up: If symptoms do not improve in 5-7 days or worsen - would check CXR

## 2018-05-16 NOTE — Progress Notes (Signed)
@Patient  ID: Anthony Friedman, male    DOB: 29-Feb-1951, 68 y.o.   MRN: [id number]  Chief Complaint  Patient presents with  . Acute Visit    headache into neck ahce, cough with green mucus, SOB, wheezing x3-4 days    Referring provider: Herma Carson, MD  HPI: 68 year old male, former smoker quit in May 2019. PMH significant for COPD GOLD C, OSA, chronic allergic rhinitis, DM type 2, hypertension, rheumatoid arthritis. Patient of Dr. Kendrick Fries, last seen 05/08/18. Maintained on Breo and prn Albuterol. Uses oral device for sleep apnea.   05/16/2018 Patient presents today for an acute visit. Complains of flu like symptoms and chest cold. Symptoms started 4 days ago. Saw primary care on Monday 3/9 and was given Tamiflu. States he has gotten a little worse. Cough is productive with green-yellow mucus. He is having mild shortness of breath and wheezing. Continues BREO, used his rescue inhaler three times over that last few days. He has not taken anything for cough. Low grade fever today. No travel or known exposure to influenza or coronavirus. Works as an Biomedical scientist.   Allergies  Allergen Reactions  . Atenolol Other (See Comments)    Lowers heart rate  . Sulfa Antibiotics Other (See Comments)    Blood in urine     Immunization History  Administered Date(s) Administered  . Influenza Split 12/06/2010, 12/06/2011, 12/05/2012  . Influenza,inj,Quad PF,6+ Mos 12/19/2013, 11/10/2014, 11/25/2016, 12/12/2017  . Influenza-Unspecified 11/30/2016  . PPD Test 08/11/2014  . Pneumococcal Conjugate-13 10/22/2014  . Pneumococcal Polysaccharide-23 12/06/2011    Past Medical History:  Diagnosis Date  . Atelectasis   . 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 Chronic allergic rhinitis   . Chronic venous insufficiency    superficial  . COPD (chronic obstructive pulmonary disease) (HCC)   . Cough   . Diabetes mellitus without complication (HCC)    not on  medications since  surgery 10/2015   . Edema    lower extremities  . Elevated liver enzymes    d/t arava  . Family history of adverse reaction to anesthesia    pts mother had difficulty with anesthesia - pt not sure what difficulties were   . Hypertension    no longer on blood pressure medications since 10/25/2015 per patient   . Kidney stones    hx of   . Leg pain   . Liver enlargement    d/t arava  . Obesity   . Pallor   . Pneumonia    hx of times 3; pt states is current with pneumonia vaccine  . RA (rheumatoid arthritis) (HCC)     rheumatoid arthritis   . Sleep apnea    say he uses a mouth guard that incremently increases airflow   . SOBOE (shortness of breath on exertion)   . Superficial thrombophlebitis    subacute-on anticoagulation  . Urinary frequency   . Urinary tract bacterial infections    hx of   . Venous stasis    changes    Tobacco History: Social History   Tobacco Use  Smoking Status Former Smoker  . Packs/day: 2.00  . Years: 45.00  . Pack years: 90.00  . Types: Cigarettes  . Last attempt to quit: 07/19/2017  . Years since quitting: 0.8  Smokeless Tobacco Never Used   Counseling given: Not Answered   Outpatient Medications Prior to Visit  Medication Sig Dispense Refill  . albuterol (PROAIR HFA)  108 (90 Base) MCG/ACT inhaler Inhale 2 puffs into the lungs every 6 (six) hours as needed for wheezing.    Marland Kitchen. amoxicillin (AMOXIL) 500 MG capsule TAKE 4 CAPSULES 1 HOUR BEFORE DENTAL PROCEDURE  0  . aspirin EC 81 MG tablet Take 1 tablet (81 mg total) by mouth daily.    . Calcium Carb-Cholecalciferol (CALCIUM 600 + D PO) Take 1 tablet by mouth 3 (three) times daily.     . CHANTIX 1 MG tablet Take 1 mg by mouth 2 (two) times daily.  2  . Cyanocobalamin (B-12) 500 MCG SUBL Place 500 mcg under the tongue See admin instructions. Take one tablet (500 mcg) sublingually twice weekly - Monday and Thursday    . diclofenac sodium (VOLTAREN) 1 % GEL Apply 4 g topically 4  (four) times daily as needed. 500 g 6  . fluticasone furoate-vilanterol (BREO ELLIPTA) 100-25 MCG/INH AEPB Inhale 1 puff into the lungs daily. 30 each 0  . folic acid (FOLVITE) 400 MCG tablet Take 800 mcg by mouth daily.    Marland Kitchen. gabapentin (NEURONTIN) 300 MG capsule Take 300 mg by mouth 3 (three) times daily.     Marland Kitchen. lidocaine (LIDODERM) 5 % Place 1 patch onto the skin daily. Remove & Discard patch within 12 hours or as directed by MD 30 patch 2  . Multiple Vitamin (MULTIVITAMIN WITH MINERALS) TABS tablet Take 1 tablet by mouth 2 (two) times daily.    Marland Kitchen. rOPINIRole (REQUIP) 1 MG tablet Take 1 mg by mouth 3 (three) times daily as needed (restless legs).     . Tamsulosin HCl (FLOMAX) 0.4 MG CAPS Take 0.4 mg by mouth daily.     . traZODone (DESYREL) 100 MG tablet Take 1 tablet (100 mg total) by mouth at bedtime. 90 tablet 1  . trospium (SANCTURA) 20 MG tablet Take 20 mg by mouth 2 (two) times daily.     No facility-administered medications prior to visit.     Review of Systems  Review of Systems  Constitutional: Negative.   HENT: Positive for congestion and sore throat.   Respiratory: Positive for cough, shortness of breath and wheezing.   Cardiovascular: Negative.    Physical Exam  BP 140/86 (BP Location: Left Arm, Cuff Size: Normal)   Pulse 72   Temp 99.3 F (37.4 C)   Ht 5\' 9"  (1.753 m)   Wt 294 lb 3.2 oz (133.4 kg)   SpO2 97%   BMI 43.45 kg/m  Physical Exam Constitutional:      Appearance: Normal appearance. He is obese. He is not ill-appearing.  HENT:     Right Ear: Tympanic membrane normal.     Left Ear: Tympanic membrane normal.     Mouth/Throat:     Mouth: Mucous membranes are moist.     Pharynx: Oropharynx is clear.  Neck:     Musculoskeletal: Normal range of motion and neck supple.  Cardiovascular:     Rate and Rhythm: Normal rate and regular rhythm.  Pulmonary:     Effort: Pulmonary effort is normal. No respiratory distress.     Breath sounds: No stridor. Wheezing  present. No rhonchi or rales.     Comments: Mostly clear, mild wheezing on exam Musculoskeletal: Normal range of motion.  Skin:    General: Skin is warm and dry.  Neurological:     General: No focal deficit present.     Mental Status: He is alert and oriented to person, place, and time. Mental status is at baseline.  Psychiatric:        Mood and Affect: Mood normal.        Behavior: Behavior normal.        Thought Content: Thought content normal.        Judgment: Judgment normal.      Lab Results:  CBC    Component Value Date/Time   WBC 11.6 (H) 05/04/2017 0646   RBC 3.63 (L) 05/04/2017 0646   HGB 12.8 (L) 05/04/2017 0646   HCT 36.2 (L) 05/04/2017 0646   PLT 247 05/04/2017 0646   MCV 99.7 05/04/2017 0646   MCH 35.3 (H) 05/04/2017 0646   MCHC 35.4 05/04/2017 0646   RDW 15.0 05/04/2017 0646   LYMPHSABS 1.8 05/02/2017 0548   MONOABS 1.2 (H) 05/02/2017 0548   EOSABS 0.3 05/02/2017 0548   BASOSABS 0.0 05/02/2017 0548    BMET    Component Value Date/Time   NA 137 05/04/2017 0646   NA 130 (A) 08/13/2014 1426   K 5.1 05/04/2017 0646   CL 101 05/04/2017 0646   CO2 26 05/04/2017 0646   GLUCOSE 111 (H) 05/04/2017 0646   BUN 21 (H) 05/04/2017 0646   BUN 66 (A) 08/13/2014 1426   CREATININE 0.85 05/04/2017 0646   CALCIUM 8.7 (L) 05/04/2017 0646   GFRNONAA >60 05/04/2017 0646   GFRAA >60 05/04/2017 0646    BNP No results found for: BNP  ProBNP No results found for: PROBNP  Imaging: No results found.   Assessment & Plan:   COPD gold stage C. - Mild COPD exacerbation d/t bronchitis and possible influenza - Lungs mostly clear on exam, mild wheezing   - Respiratory viral panel pending  - Continue Breo and prn Albuterol  - RX doxycyline 1 tab BID x 7 days and prednisone 20mg  x 5 days - Advised mucinex twice daily, delsym OTC and hycodan at bedtime with safe precautions (PMP reviewed with no unexpected prescriptions) - If not improvement patient instructed to  return, check CXR  Glenford Bayley, NP 05/16/2018

## 2018-05-16 NOTE — Assessment & Plan Note (Addendum)
-   Mild COPD exacerbation d/t bronchitis and possible influenza - Lungs mostly clear on exam, mild wheezing   - Respiratory viral panel pending  - Continue Breo and prn Albuterol  - RX doxycyline 1 tab BID x 7 days and prednisone 20mg  x 5 days - Advised mucinex twice daily, delsym OTC and hycodan at bedtime with safe precautions (PMP reviewed with no unexpected prescriptions) - If not improvement patient instructed to return, check CXR

## 2018-05-16 NOTE — Progress Notes (Signed)
Reviewed, agree 

## 2018-05-17 ENCOUNTER — Telehealth: Payer: Self-pay | Admitting: Pulmonary Disease

## 2018-05-17 NOTE — Telephone Encounter (Signed)
Please call to let patient know that his panel came back for metapneumovirus. Continue medications as directed by [facility]. Follow up in 1-2 weeks.

## 2018-05-17 NOTE — Telephone Encounter (Signed)
Spoke with pt.  He is requesting Respiratory virus panel results from yesterday.  Results are in epic.  Please advise on results and if any other recommendations from ov on 05/16/18.  Sending to app of the day.

## 2018-05-17 NOTE — Telephone Encounter (Signed)
Spoke with pt. He is aware of results. Nothing further was needed. 

## 2018-06-07 ENCOUNTER — Telehealth: Payer: Self-pay | Admitting: Pulmonary Disease

## 2018-06-07 MED ORDER — FLUTICASONE FUROATE-VILANTEROL 100-25 MCG/INH IN AEPB: 1.0000 | INHALATION_SPRAY | Freq: Every day | RESPIRATORY_TRACT | 6 refills | Status: DC

## 2018-06-07 NOTE — Telephone Encounter (Signed)
I let patient know we do not have samples at this time and would need to send script in for [facility].

## 2018-06-07 NOTE — Telephone Encounter (Signed)
lmom 

## 2018-06-07 NOTE — Telephone Encounter (Signed)
Pt is calling back [phone]

## 2018-09-04 ENCOUNTER — Telehealth: Payer: Self-pay | Admitting: Pulmonary Disease

## 2018-09-04 MED ORDER — BREO ELLIPTA 100-25 MCG/INH IN AEPB: 1.0000 | INHALATION_SPRAY | Freq: Every day | RESPIRATORY_TRACT | 0 refills | Status: AC

## 2018-09-04 NOTE — Telephone Encounter (Signed)
Returned call to patient.  Verified patient on Breo 100/25.  Sample placed at front door for pick up.  Patient acknowledged understanding. Nothing further needed.

## 2018-10-01 ENCOUNTER — Other Ambulatory Visit: Payer: Self-pay | Admitting: Pulmonary Disease

## 2018-10-01 NOTE — Telephone Encounter (Signed)
Pt calling to check on the status of med refill he can be reached @ [phone].Anthony Friedman

## 2018-10-09 ENCOUNTER — Telehealth: Payer: Self-pay | Admitting: Pulmonary Disease

## 2018-10-09 NOTE — Telephone Encounter (Signed)
Yes ok 

## 2018-10-09 NOTE — Telephone Encounter (Signed)
Called and spoke with pt. Pt requesting samples of Breo. Pt is on Breo 100. Beth, please advise if you are okay with [place] providing pt with samples of Breo 100.

## 2018-10-09 NOTE — Telephone Encounter (Signed)
Called pt and advised message from the provider. Pt understood and verbalized understanding. Nothing further is needed.   Will leave samples of Breo up front.

## 2018-10-10 MED ORDER — BREO ELLIPTA 100-25 MCG/INH IN AEPB: 1.0000 | INHALATION_SPRAY | Freq: Every day | RESPIRATORY_TRACT | 0 refills | Status: DC

## 2018-10-10 NOTE — Addendum Note (Signed)
Addended by: Jannette Spanner on: 10/10/2018 10:20 AM   Modules accepted: Orders

## 2018-10-15 ENCOUNTER — Ambulatory Visit (INDEPENDENT_AMBULATORY_CARE_PROVIDER_SITE_OTHER): Payer: Medicare HMO

## 2018-10-15 ENCOUNTER — Ambulatory Visit (INDEPENDENT_AMBULATORY_CARE_PROVIDER_SITE_OTHER): Payer: Medicare HMO | Admitting: Orthopaedic Surgery

## 2018-10-15 ENCOUNTER — Encounter: Payer: Self-pay | Admitting: Orthopaedic Surgery

## 2018-10-15 ENCOUNTER — Other Ambulatory Visit: Payer: Self-pay

## 2018-10-15 DIAGNOSIS — M25562 Pain in left knee: Secondary | ICD-10-CM

## 2018-10-15 DIAGNOSIS — M5442 Lumbago with sciatica, left side: Secondary | ICD-10-CM | POA: Diagnosis not present

## 2018-10-15 DIAGNOSIS — G8929 Other chronic pain: Secondary | ICD-10-CM | POA: Diagnosis not present

## 2018-10-15 DIAGNOSIS — M25552 Pain in left hip: Secondary | ICD-10-CM

## 2018-10-15 MED ORDER — HYDROCODONE-ACETAMINOPHEN 5-325 MG PO TABS: 1.0000 | ORAL_TABLET | Freq: Four times a day (QID) | ORAL | 0 refills | Status: DC | PRN

## 2018-10-15 NOTE — Progress Notes (Signed)
Office Visit Note   Patient: Anthony Friedman           Date of Birth: 1950-04-25           MRN: [id number] Visit Date: 10/15/2018              Requested by: Cathlean Sauer, [address] [place],  [place] [address] PCP: Cathlean Sauer, MD   Assessment & Plan: Visit Diagnoses:  1. Chronic pain of left knee   2. Pain in left hip   3. Chronic left-sided low back pain with left-sided sciatica     Plan: He is inquiring about seeing a spine specialist.  I told him the difficulty of this given the fact that he has had surgeries before and does have a spinal cord stimulator.  I would like to refer him to spine and scoliosis specialist and Dr. Patrice Paradise to see if they have an opinion about removing a spinal cord stimulator or considering injections in his lumbar spine and to evaluate him in terms of his lumbar spine in general.  From my standpoint I do not need to see him back for a year for repeat x-rays of his hips and knees.  I will send in a one-time prescription for hydrocodone for him.  I have encouraged activity modification and weight loss.  Follow-Up Instructions: Return in about 1 year (around 10/15/2019).   Orders:  Orders Placed This Encounter  Procedures  . XR Knee 1-2 Views Left  . XR HIP UNILAT W OR W/O PELVIS 1V LEFT  . XR Lumbar Spine 2-3 Views   No orders of the defined types were placed in this encounter.     Procedures: No procedures performed   Clinical Data: No additional findings.   Subjective: Chief Complaint  Patient presents with  . Left Hip - Pain  . Left Knee - Pain  . Lower Back - Pain  The patient comes in today still complaining of left knee and left hip pain as well as severe low back pain.  He has a previous history of bilateral hip replacements and bilateral knee replacements as well as a lumbar spine fusion.  We performed his hip replacements.  We performed a polyliner exchange on his left knee.  His back surgery was done remotely and that  back surgeon since no longer around.  He gets pain that radiates into his right SI joint.  This is been slowly getting worse with time.  He is someone who is morbidly obese.  He does walk with a walking stick.  He is not on chronic pain medications.  He cannot take anti-inflammatories due to chronic kidney disease.  HPI  Review of Systems He currently denies any headache, chest pain, shortness of breath, fever, chills, nausea, vomiting  Objective: Vital Signs: There were no vitals taken for this visit.  Physical Exam He is alert and orient x3 and in no acute distress Ortho Exam Examination of both hips show the move smoothly.  Both knees feel ligamentously stable.  There is no effusion around either knee.  He has significant limitations with flexion extension of lumbar spine. Specialty Comments:  No specialty comments available.  Imaging: Xr Hip Unilat W Or W/o Pelvis 1v Left  Result Date: 10/15/2018 An AP pelvis and lateral left hip shows a total hip arthroplasty bilaterally with no complicating features on the left side.  These are compared to films from a year ago.  Xr Knee 1-2 Views Left  Result Date: 10/15/2018 2 views of the left knee show total knee arthroplasty with no complicating features.  There is been no interval change when compared to x-rays from 1 year ago.  Xr Lumbar Spine 2-3 Views  Result Date: 10/15/2018 2 views of the lumbar spine shows severe arthritic changes at multiple levels.  There is complete loss of lumbar lordosis.  There are pedicle screws from L1-L3.  There is also evidence of a spinal cord stimulator    PMFS History: Patient Active Problem List   Diagnosis Date Noted  . Cellulitis of left hand 05/02/2017  . Left wrist pain 05/01/2017  . 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 Leg hematoma, left, initial encounter 11/02/2016  . OSA (obstructive sleep apnea)  02/02/2016  . Osteoarthritis of left hip 12/11/2015  . Status post left hip replacement 12/11/2015  . Bradycardia 10/28/2015  . Morbid obesity (HCC) 10/26/2015  . Pre-op evaluation 04/16/2015  . Osteoarthritis of right hip 08/08/2014  . Status post total replacement of right hip 08/08/2014  . 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 Edema, peripheral 10/29/2013  . Insufficiency, vascular 10/29/2013  . Cervical osteoarthritis 09/13/2013  . Cervical pain 09/05/2013  . 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 Benign prostatic hypertrophy without urinary obstruction 04/04/2013  . Restless leg 04/04/2013  . Diabetes mellitus, type 2 (HCC) 04/04/2013  . 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 Anxiety 12/20/2012  . DDD (degenerative disc disease), lumbosacral 12/20/2012  . 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 Varicose veins of both legs with edema 10/25/2012  . Acquired spondylolisthesis 08/23/2012  . Chronic pain 08/11/2012  . Excessive urination at night 08/11/2012  . RA (rheumatoid arthritis) (HCC)   . Chronic venous insufficiency   . COPD gold stage C.   . Obesity   . Hypertension   . Chronic allergic rhinitis    Past Medical History:  Diagnosis Date  . Atelectasis   . 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 Chronic allergic rhinitis   . Chronic venous insufficiency    superficial  . COPD (chronic obstructive pulmonary disease) (HCC)   . Cough   . Diabetes mellitus without complication (HCC)    not on medications since  surgery 10/2015   . Edema    lower extremities  . Elevated liver enzymes    d/t arava  . Family history of adverse reaction to anesthesia    pts mother had difficulty with anesthesia - pt not sure what difficulties were   . Hypertension    no longer on blood pressure medications since 10/25/2015 per patient   . Kidney stones    hx of   . Leg pain   . Liver enlargement    d/t arava  . Obesity   . Pallor   . Pneumonia    hx  of times 3; pt states is current with pneumonia vaccine  . RA (rheumatoid arthritis) (HCC)     rheumatoid arthritis   . Sleep apnea    say he uses a mouth guard that incremently increases airflow   . SOBOE (shortness of breath on exertion)   . Superficial thrombophlebitis    subacute-on anticoagulation  . Urinary frequency   . Urinary tract bacterial infections    hx of   . Venous stasis    changes    Family History  Problem Relation Age of Onset  . Heart attack Father   .  Esophageal cancer Mother   . Other Mother        Tachycardia  . Diabetes Brother     Past Surgical History:  Procedure Laterality Date  . HAND SURGERY  1965   right hand  . I&D EXTREMITY Left 05/03/2017   Procedure: IRRIGATION AND DEBRIDEMENT EXTREMITY;  Surgeon: Roby LoftsHaddix, Kevin P, MD;  Location: [facility] OR;  Service: Orthopedics;  Laterality: Left;  . I&D KNEE WITH POLY EXCHANGE Left 11/04/2016   Procedure: LEFT KNEE POLY EXCHANGE;  Surgeon: Kathryne HitchBlackman, Alvera Tourigny Y, MD;  Location: [facility];  Service: Orthopedics;  Laterality: Left;  . KNEE SURGERY     arthroscopic right and left knee  . LAMINECTOMY  1988   and fusion; L4, L5, S1  . LAMINECTOMY  2007   and fusion L1, L2, L3  . LAPAROSCOPIC GASTRIC SLEEVE RESECTION N/A 10/26/2015   Procedure: LAPAROSCOPIC GASTRIC SLEEVE RESECTION, UPPER ENDO;  Surgeon: Ovidio Kinavid Newman, MD;  Location: [facility];  Service: General;  Laterality: N/A;  . OTHER SURGICAL HISTORY     polynidal cyst  . REPLACEMENT TOTAL KNEE  2009   left 2009; right 2014  . SPINAL CORD STIMULATOR IMPLANT     to control back pain   . TOTAL HIP ARTHROPLASTY Right 08/08/2014   Procedure: RIGHT TOTAL HIP ARTHROPLASTY ANTERIOR APPROACH;  Surgeon: Kathryne Hitchhristopher Y Nevaen Tredway, MD;  Location: [facility];  Service: Orthopedics;  Laterality: Right;  . TOTAL HIP ARTHROPLASTY Left 12/11/2015   Procedure: LEFT TOTAL HIP ARTHROPLASTY ANTERIOR APPROACH;  Surgeon: Kathryne Hitchhristopher Y Ravin Denardo, MD;  Location: [facility];  Service: Orthopedics;   Laterality: Left;  . UMBILICAL HERNIA REPAIR     Social History   Occupational History  . Occupation: disabled     Comment: former Chief Financial Officertextbook manager, office work   Tobacco Use  . Smoking status: Former Smoker    Packs/day: 2.00    Years: 45.00    Pack years: 90.00    Types: Cigarettes    Quit date: 07/19/2017    Years since quitting: 1.2  . Smokeless tobacco: Never Used  Substance and Sexual Activity  . Alcohol use: No    Alcohol/week: 0.0 standard drinks    Comment: recovering alcoholic - none since 2012   . Drug use: No    Comment: hx of marijuana in teenage years  . Sexual activity: Not on file

## 2018-10-17 ENCOUNTER — Telehealth: Payer: Self-pay | Admitting: *Deleted

## 2018-10-17 NOTE — Telephone Encounter (Signed)
Received call on vm from [place] with [facility] requesting [facility] # on this pt, I called and left message with Anderson Malta to return my call for the number which is [id number]. Pending call back

## 2018-10-18 ENCOUNTER — Telehealth: Payer: Self-pay | Admitting: Orthopaedic Surgery

## 2018-10-18 NOTE — Telephone Encounter (Signed)
Patient states they did call him

## 2018-10-18 NOTE — Telephone Encounter (Signed)
Patient left a message this morning stating that Dr. Ninfa Linden was going to refer him to Dr. Yancey Flemings, not sure of the spelling or the name, but he stated that he has not heard from them.  CB#[phone].  Thank you.

## 2018-11-20 ENCOUNTER — Telehealth: Payer: Self-pay | Admitting: Pulmonary Disease

## 2018-11-20 MED ORDER — BREO ELLIPTA 100-25 MCG/INH IN AEPB: 1.0000 | INHALATION_SPRAY | Freq: Every day | RESPIRATORY_TRACT | 0 refills | Status: DC

## 2018-11-20 NOTE — Telephone Encounter (Signed)
Breo sample left up front for pt.  Pt aware.  Nothing further needed at this time- will close encounter.

## 2018-12-13 ENCOUNTER — Telehealth: Payer: Self-pay | Admitting: Pulmonary Disease

## 2018-12-13 MED ORDER — BREO ELLIPTA 100-25 MCG/INH IN AEPB: 1.0000 | INHALATION_SPRAY | Freq: Every day | RESPIRATORY_TRACT | 0 refills | Status: DC

## 2018-12-13 NOTE — Telephone Encounter (Signed)
Called and spoke to pt. Pt is requesting a sample of Breo 100. Pt has had samples in the past. Pt states the [facility] is too expensive. Advised pt I will place pt assistance forms in with the samples, he needs to complete them and return them back. Pt verbalized understanding and denied any further questions or concerns at this time.

## 2018-12-27 ENCOUNTER — Other Ambulatory Visit: Payer: Self-pay

## 2018-12-27 ENCOUNTER — Encounter: Payer: Self-pay | Admitting: Pulmonary Disease

## 2018-12-27 ENCOUNTER — Ambulatory Visit: Payer: Medicare HMO | Admitting: Pulmonary Disease

## 2018-12-27 VITALS — BP 130/76 | HR 70 | Temp 97.0°F | Ht 69.0 in | Wt 301.2 lb

## 2018-12-27 DIAGNOSIS — J449 Chronic obstructive pulmonary disease, unspecified: Secondary | ICD-10-CM

## 2018-12-27 DIAGNOSIS — E669 Obesity, unspecified: Secondary | ICD-10-CM | POA: Diagnosis not present

## 2018-12-27 DIAGNOSIS — G4733 Obstructive sleep apnea (adult) (pediatric): Secondary | ICD-10-CM | POA: Diagnosis not present

## 2018-12-27 DIAGNOSIS — G4730 Sleep apnea, unspecified: Secondary | ICD-10-CM

## 2018-12-27 MED ORDER — BREO ELLIPTA 100-25 MCG/INH IN AEPB: 1.0000 | INHALATION_SPRAY | Freq: Every day | RESPIRATORY_TRACT | 0 refills | Status: DC

## 2018-12-27 NOTE — Progress Notes (Signed)
[facility]  Chief Complaint  Patient presents with  . Obstructive Sleep Apnea    Patient has been using dental appliance. But will be having dental work, so the appliance will not work. Would like new sleep study, then determine the next step.    Constitutional:  BP 130/76 (BP Location: Left Arm, Patient Position: Sitting, Cuff Size: Large)   Pulse 70   Temp (!) 97 F (36.1 C)   Ht 5\' 9"  (1.753 m)   Wt (!) 301 lb 3.2 oz (136.6 kg)   SpO2 96% Comment: on room air  BMI 44.48 kg/m   Past Medical History:  RA, PNA, Nephrolithiasis, HTN, DM  Brief Summary:  Anthony Friedman is a 68 y.o. male former smoker with obstructive sleep apnea, and COPD.  He has been using oral appliance.  Has trouble with his teeth and will likely need dental extraction.  As such oral appliance wouldn't be an option for him.  He would like to switch to CPAP again once he has dental work done at beginning of next year.  He snores, has sleep disruption, and daytime sleepiness if he is not on therapy for sleep apnea.  He was last on CPAP about 5 yrs ago.  He is having more trouble with wrist pain from RA.  Started on plaquenil.  Uses Breo daily.  This helps.  He is concerned about expense of inhaler.  No having cough, wheeze, sputum.  Not using albuterol much.  Physical Exam:   Appearance - well kempt   ENMT - no sinus tenderness, no nasal discharge, no oral exudate, decay of tooth on left upper side  Neck - no masses, trachea midline, no thyromegaly, no elevation in JVP  Respiratory - normal appearance of chest wall, normal respiratory effort w/o accessory muscle use, no dullness on percussion, no wheezing or rales  CV - s1s2 regular rate and rhythm, no murmurs, no peripheral edema, radial pulses symmetric  GI - soft, non tender  Lymph - no adenopathy noted in neck and axillary areas  MSK - walks with a cane  Ext - no cyanosis, clubbing, or joint inflammation noted   Skin - no rashes, lesions, or ulcers  Neuro - normal strength, oriented x 3  Psych - normal mood and affect  Discussion:    Assessment/Plan:   Obstructive sleep apnea. - he will continue to use oral appliance for now - would like to switch to CPAP after he has dental work done at beginning of 2021 - will need home sleep study to assess status of sleep apnea prior to get CPAP set up  Obesity. - discussed importance of weight loss  COPD. - reviewed role of different inhalers in detail - continue breo with prn albuterol  Rheumatoid arthritis. - followed by Dr. Amil Amen with rheumatology   Patient Instructions  Call when you are ready to get home sleep study set up  Follow up in 6 months  A total of  27 minutes were spent face to face with the patient and more than half of that time involved counseling or coordination of care.   Chesley Mires, MD [facility] Pager: [phone] 12/27/2018, 10:14 AM  Flow Sheet     Pulmonary tests:  PFT 06/21/10 >> FEV1 1.94 (60%), FEV1% 68, TLC 4.11 (64%), DLCO 67%  Sleep tests:  HST 09/27/17 >> 10.9, SaO2 low 83% (while using oral appliance)  Cardiac tests:  Echo 10/28/15 >> mod LVH, EF 60 to  65%, grade 2 DD  Medications:   Allergies as of 12/27/2018      Reactions   Atenolol Other (See Comments)   Lowers heart rate   Sulfa Antibiotics Other (See Comments)   Blood in urine      Medication List       Accurate as of December 27, 2018 10:14 AM. If you have any questions, ask your nurse or doctor.        STOP taking these medications   amoxicillin 500 MG capsule Commonly known as: AMOXIL Stopped by: Coralyn Helling, MD   Chantix 1 MG tablet Generic drug: varenicline Stopped by: Coralyn Helling, MD   doxycycline 100 MG tablet Commonly known as: VIBRA-TABS Stopped by: Coralyn Helling, MD   HYDROcodone-acetaminophen 5-325 MG tablet Commonly known as: NORCO/VICODIN Stopped by: Coralyn Helling, MD    HYDROcodone-homatropine 5-1.5 MG/5ML syrup Commonly known as: HYCODAN Stopped by: Coralyn Helling, MD   lidocaine 5 % Commonly known as: Lidoderm Stopped by: Coralyn Helling, MD   predniSONE 10 MG tablet Commonly known as: DELTASONE Stopped by: Coralyn Helling, MD     TAKE these medications   aspirin EC 81 MG tablet Take 1 tablet (81 mg total) by mouth daily.   B-12 500 MCG Subl Place 500 mcg under the tongue See admin instructions. Take one tablet (500 mcg) sublingually twice weekly - Monday and Thursday   Breo Ellipta 100-25 MCG/INH Aepb Generic drug: fluticasone furoate-vilanterol Inhale 1 puff into the lungs daily. What changed: Another medication with the same name was removed. Continue taking this medication, and follow the directions you see here. Changed by: Coralyn Helling, MD   CALCIUM 600 + D PO Take 1 tablet by mouth 3 (three) times daily.   diclofenac sodium 1 % Gel Commonly known as: VOLTAREN Apply 4 g topically 4 (four) times daily as needed.   Flomax 0.4 MG Caps capsule Generic drug: tamsulosin Take 0.4 mg by mouth daily.   folic acid 400 MCG tablet Commonly known as: FOLVITE Take 800 mcg by mouth daily.   gabapentin 300 MG capsule Commonly known as: NEURONTIN Take 300 mg by mouth 4 (four) times daily.   hydroxychloroquine 200 MG tablet Commonly known as: PLAQUENIL Take 200 mg by mouth 2 (two) times daily.   methotrexate 2.5 MG tablet Commonly known as: RHEUMATREX Take 1 tablet by mouth once a week. For 8 weeks   multivitamin with minerals Tabs tablet Take 1 tablet by mouth 2 (two) times daily.   ProAir HFA 108 (90 Base) MCG/ACT inhaler Generic drug: albuterol Inhale 2 puffs into the lungs every 6 (six) hours as needed for wheezing.   rOPINIRole 1 MG tablet Commonly known as: REQUIP Take 1 mg by mouth 3 (three) times daily as needed (restless legs).   traZODone 100 MG tablet Commonly known as: DESYREL TAKE 1 TABLET BY MOUTH EVERYDAY AT BEDTIME    trospium 20 MG tablet Commonly known as: SANCTURA Take 20 mg by mouth 2 (two) times daily.       Past Surgical History:  He  has a past surgical history that includes Hand surgery (1965); Knee surgery; Other surgical history; Umbilical hernia repair; Laminectomy (1988); Laminectomy (2007); Replacement total knee (2009); Spinal cord stimulator implant; Total hip arthroplasty (Right, 08/08/2014); Laparoscopic gastric sleeve resection (N/A, 10/26/2015); Total hip arthroplasty (Left, 12/11/2015); I&D knee with poly exchange (Left, 11/04/2016); and I&D extremity (Left, 05/03/2017).  Family History:  His family history includes Diabetes in his brother; Esophageal cancer in his mother;  Heart attack in his father; Other in his mother.  Social History:  He  reports that he quit smoking about 17 months ago. His smoking use included cigarettes. He has a 90.00 pack-year smoking history. He has never used smokeless tobacco. He reports that he does not drink alcohol or use drugs.

## 2018-12-27 NOTE — Addendum Note (Signed)
Addended by: Nena Polio on: 12/27/2018 02:28 PM   Modules accepted: Orders

## 2018-12-27 NOTE — Addendum Note (Signed)
Addended by: Nena Polio on: 12/27/2018 02:37 PM   Modules accepted: Orders

## 2018-12-27 NOTE — Patient Instructions (Signed)
Call when you are ready to get home sleep study set up  Follow up in 6 months

## 2019-02-25 ENCOUNTER — Telehealth: Payer: Self-pay | Admitting: Pulmonary Disease

## 2019-02-25 MED ORDER — BREO ELLIPTA 100-25 MCG/INH IN AEPB: 1.0000 | INHALATION_SPRAY | Freq: Every day | RESPIRATORY_TRACT | 0 refills | Status: DC

## 2019-02-25 MED ORDER — BREO ELLIPTA 100-25 MCG/INH IN AEPB: 1.0000 | INHALATION_SPRAY | Freq: Every day | RESPIRATORY_TRACT | 5 refills | Status: DC

## 2019-02-25 NOTE — Telephone Encounter (Signed)
Called and spoke with pt to clarify what med he was asking samples of and pt verified that it was his breo inhaler. I stated to pt that we did have samples that I would put up front for him. Stated to pt that I was also going to send Rx to pharmacy that pt would need to try to fill once samples are finished and told him if cost was too much that we could see if he qualified for patient assistance. Pt verbalized understanding. Samples placed up front and Rx sent to pharmacy. Nothing further needed.

## 2019-03-25 ENCOUNTER — Other Ambulatory Visit: Payer: Self-pay | Admitting: Pulmonary Disease

## 2019-07-29 ENCOUNTER — Emergency Department (HOSPITAL_BASED_OUTPATIENT_CLINIC_OR_DEPARTMENT_OTHER)
Admission: EM | Admit: 2019-07-29 | Discharge: 2019-07-29 | Disposition: A | Discharge status: Home / Self Care | Payer: Medicare HMO | Attending: Emergency Medicine | Admitting: Emergency Medicine

## 2019-07-29 ENCOUNTER — Emergency Department (HOSPITAL_BASED_OUTPATIENT_CLINIC_OR_DEPARTMENT_OTHER): Payer: Medicare HMO

## 2019-07-29 ENCOUNTER — Other Ambulatory Visit: Payer: Self-pay

## 2019-07-29 ENCOUNTER — Encounter (HOSPITAL_BASED_OUTPATIENT_CLINIC_OR_DEPARTMENT_OTHER): Payer: Self-pay | Admitting: Emergency Medicine

## 2019-07-29 DIAGNOSIS — Z79899 Other long term (current) drug therapy: Secondary | ICD-10-CM | POA: Diagnosis not present

## 2019-07-29 DIAGNOSIS — R1013 Epigastric pain: Secondary | ICD-10-CM | POA: Diagnosis not present

## 2019-07-29 DIAGNOSIS — Z96641 Presence of right artificial hip joint: Secondary | ICD-10-CM | POA: Insufficient documentation

## 2019-07-29 DIAGNOSIS — I10 Essential (primary) hypertension: Secondary | ICD-10-CM | POA: Diagnosis not present

## 2019-07-29 DIAGNOSIS — E119 Type 2 diabetes mellitus without complications: Secondary | ICD-10-CM | POA: Diagnosis not present

## 2019-07-29 DIAGNOSIS — Z96642 Presence of left artificial hip joint: Secondary | ICD-10-CM | POA: Diagnosis not present

## 2019-07-29 DIAGNOSIS — Z87891 Personal history of nicotine dependence: Secondary | ICD-10-CM | POA: Insufficient documentation

## 2019-07-29 DIAGNOSIS — J449 Chronic obstructive pulmonary disease, unspecified: Secondary | ICD-10-CM | POA: Diagnosis not present

## 2019-07-29 LAB — COMPREHENSIVE METABOLIC PANEL
ALT: 20 U/L (ref 0–44)
AST: 27 U/L (ref 15–41)
Albumin: 4 g/dL (ref 3.5–5.0)
Alkaline Phosphatase: 66 U/L (ref 38–126)
Anion gap: 11 (ref 5–15)
BUN: 19 mg/dL (ref 8–23)
CO2: 28 mmol/L (ref 22–32)
Calcium: 9.4 mg/dL (ref 8.9–10.3)
Chloride: 94 mmol/L — ABNORMAL LOW (ref 98–111)
Creatinine, Ser: 0.85 mg/dL (ref 0.61–1.24)
GFR calc Af Amer: >60 mL/min (ref >60)
GFR calc non Af Amer: >60 mL/min (ref >60)
Glucose, Bld: 104 mg/dL — ABNORMAL HIGH (ref 70–99)
Potassium: 3.9 mmol/L (ref 3.5–5.1)
Sodium: 133 mmol/L — ABNORMAL LOW (ref 135–145)
Total Bilirubin: 1.2 mg/dL (ref 0.3–1.2)
Total Protein: 7 g/dL (ref 6.5–8.1)

## 2019-07-29 LAB — CBC WITH DIFFERENTIAL/PLATELET
Abs Immature Granulocytes: 0.03 10*3/uL (ref 0.00–0.07)
Basophils Absolute: 0.1 10*3/uL (ref 0.0–0.1)
Basophils Relative: 1 %
Eosinophils Absolute: 0.2 10*3/uL (ref 0.0–0.5)
Eosinophils Relative: 2 %
HCT: 45.1 % (ref 39.0–52.0)
Hemoglobin: 16.1 g/dL (ref 13.0–17.0)
Immature Granulocytes: 0 %
Lymphocytes Relative: 17 %
Lymphs Abs: 1.8 10*3/uL (ref 0.7–4.0)
MCH: 33.7 pg (ref 26.0–34.0)
MCHC: 35.7 g/dL (ref 30.0–36.0)
MCV: 94.4 fL (ref 80.0–100.0)
Monocytes Absolute: 1.3 10*3/uL — ABNORMAL HIGH (ref 0.1–1.0)
Monocytes Relative: 12 %
Neutro Abs: 6.9 10*3/uL (ref 1.7–7.7)
Neutrophils Relative %: 68 %
Platelets: 260 10*3/uL (ref 150–400)
RBC: 4.78 MIL/uL (ref 4.22–5.81)
RDW: 13.4 % (ref 11.5–15.5)
WBC: 10.3 10*3/uL (ref 4.0–10.5)
nRBC: 0 % (ref 0.0–0.2)

## 2019-07-29 LAB — LIPASE, BLOOD: Lipase: 52 U/L — ABNORMAL HIGH (ref 11–51)

## 2019-07-29 MED ORDER — LIDOCAINE VISCOUS HCL 2 % MT SOLN
15.0000 mL | Freq: Once | OROMUCOSAL | Status: AC
  Administered 2019-07-29: 15 mL via ORAL
  Filled 2019-07-29: qty 15

## 2019-07-29 MED ORDER — PANTOPRAZOLE SODIUM 40 MG IV SOLR
40.0000 mg | Freq: Once | INTRAVENOUS | Status: AC
  Administered 2019-07-29: 40 mg via INTRAVENOUS
  Filled 2019-07-29: qty 40

## 2019-07-29 MED ORDER — FENTANYL CITRATE (PF) 100 MCG/2ML IJ SOLN
50.0000 ug | Freq: Once | INTRAMUSCULAR | Status: AC
  Administered 2019-07-29: 50 ug via INTRAVENOUS
  Filled 2019-07-29: qty 2

## 2019-07-29 MED ORDER — HYDROCODONE-ACETAMINOPHEN 5-325 MG PO TABS: 1.0000 | ORAL_TABLET | Freq: Four times a day (QID) | ORAL | 0 refills | Status: DC | PRN

## 2019-07-29 MED ORDER — HYDROCODONE-ACETAMINOPHEN 5-325 MG PO TABS
1.0000 | ORAL_TABLET | Freq: Once | ORAL | Status: AC
  Administered 2019-07-29: 1 via ORAL
  Filled 2019-07-29: qty 1

## 2019-07-29 MED ORDER — ALUM & MAG HYDROXIDE-SIMETH 200-200-20 MG/5ML PO SUSP
30.0000 mL | Freq: Once | ORAL | Status: AC
  Administered 2019-07-29: 30 mL via ORAL
  Filled 2019-07-29: qty 30

## 2019-07-29 MED ORDER — PANTOPRAZOLE SODIUM 20 MG PO TBEC
20.0000 mg | DELAYED_RELEASE_TABLET | Freq: Every day | ORAL | 0 refills | Status: DC
Start: 2019-07-29 — End: 2019-10-15

## 2019-07-29 MED ORDER — IOHEXOL 300 MG/ML  SOLN
100.0000 mL | Freq: Once | INTRAMUSCULAR | Status: AC | PRN
  Administered 2019-07-29: 100 mL via INTRAVENOUS

## 2019-07-29 NOTE — Discharge Instructions (Addendum)
You are treated for abdominal pain.  I have prescribed you a acid pill and I want you to take it as prescribed.  I have also given you pain meds please use them as needed do not operate heavy equipment or motor vehicles while taking this medication.  I recommend that you follow-up with your primary care doctor as I feel that you need to be seen by a GI specialist for further evaluation of your condition.  Please returned to the hospital if you experience uncontrolled nausea, vomiting, diarrhea, intense abdominal pain, you start to notice blood in your stool or vomit as these are signs that he needs to be reevaluated.

## 2019-07-29 NOTE — ED Provider Notes (Signed)
[facility] EMERGENCY DEPARTMENT Provider Note   CSN: [id number] Arrival date & time: 07/29/19  1928     History Chief Complaint  Patient presents with  . Abdominal Pain    Anthony Friedman is a 69 y.o. male.  HPI   Patient presents to the emergency department with chief complaint of epigastric pain that started on Friday.  Patient describes the pain as a constant pain denies any alleviating or aggravating factors.  He does admit that he has had frequent burps and some acid reflux symptoms.  She admits to smoking, denies drinking, or the use of NSAIDs.  Denies any fever, chills, nausea vomiting, diarrhea.  Patient states he is taken some laxatives he has had 5 bowel movements on Sunday which she reports were watery but denies seeing any blood in it.  Patient had normal stools today denies any blood in it.  Patient denies any urinary symptoms, back pain.  Patient has significant medical history of gastric bypass and rheumatoid arthritis.  Denies fever, chills, headache, sore throat, cough, chest pain, shortness of breath, difficulty urinating, diarrhea, pedal edema.  Past Medical History:  Diagnosis Date  . Atelectasis   . 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 Chronic allergic rhinitis   . Chronic venous insufficiency    superficial  . COPD (chronic obstructive pulmonary disease) (HCC)   . Cough   . Diabetes mellitus without complication (HCC)    not on medications since  surgery 10/2015   . Edema    lower extremities  . Elevated liver enzymes    d/t arava  . Family history of adverse reaction to anesthesia    pts mother had difficulty with anesthesia - pt not sure what difficulties were   . Hypertension    no longer on blood pressure medications since 10/25/2015 per patient   . Kidney stones    hx of   . Leg pain   . Liver enlargement    d/t arava  . Obesity   . Pallor   . Pneumonia    hx of times 3; pt states is current  with pneumonia vaccine  . RA (rheumatoid arthritis) (HCC)     rheumatoid arthritis   . Sleep apnea    say he uses a mouth guard that incremently increases airflow   . SOBOE (shortness of breath on exertion)   . Superficial thrombophlebitis    subacute-on anticoagulation  . Urinary frequency   . Urinary tract bacterial infections    hx of   . Venous stasis    changes    Patient Active Problem List   Diagnosis Date Noted  . Cellulitis of left hand 05/02/2017  . Left wrist pain 05/01/2017  . 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 Leg hematoma, left, initial encounter 11/02/2016  . OSA (obstructive sleep apnea) 02/02/2016  . Osteoarthritis of left hip 12/11/2015  . Status post left hip replacement 12/11/2015  . Bradycardia 10/28/2015  . Morbid obesity (HCC) 10/26/2015  . Pre-op evaluation 04/16/2015  . Osteoarthritis of right hip 08/08/2014  . Status post total replacement of right hip 08/08/2014  . 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 Edema, peripheral 10/29/2013  . Insufficiency, vascular 10/29/2013  . Cervical osteoarthritis 09/13/2013  . Cervical pain 09/05/2013  . 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 Benign prostatic hypertrophy without urinary obstruction 04/04/2013  . Restless leg 04/04/2013  . Diabetes mellitus, type 2 (  HCC) 04/04/2013  . 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 Anxiety 12/20/2012  . DDD (degenerative disc disease), lumbosacral 12/20/2012  . 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 Varicose veins of both legs with edema 10/25/2012  . Acquired spondylolisthesis 08/23/2012  . Chronic pain 08/11/2012  . Excessive urination at night 08/11/2012  . RA (rheumatoid arthritis) (HCC)   . Chronic venous insufficiency   . COPD gold stage C.   . Obesity   . Hypertension   . Chronic allergic rhinitis     Past Surgical History:  Procedure Laterality Date  . HAND SURGERY  1965   right  hand  . I & D EXTREMITY Left 05/03/2017   Procedure: IRRIGATION AND DEBRIDEMENT EXTREMITY;  Surgeon: Roby Lofts, MD;  Location: [facility] OR;  Service: Orthopedics;  Laterality: Left;  . I & D KNEE WITH POLY EXCHANGE Left 11/04/2016   Procedure: LEFT KNEE POLY EXCHANGE;  Surgeon: Kathryne Hitch, MD;  Location: [facility];  Service: Orthopedics;  Laterality: Left;  . KNEE SURGERY     arthroscopic right and left knee  . LAMINECTOMY  1988   and fusion; L4, L5, S1  . LAMINECTOMY  2007   and fusion L1, L2, L3  . LAPAROSCOPIC GASTRIC SLEEVE RESECTION N/A 10/26/2015   Procedure: LAPAROSCOPIC GASTRIC SLEEVE RESECTION, UPPER ENDO;  Surgeon: Ovidio Kin, MD;  Location: [facility];  Service: General;  Laterality: N/A;  . OTHER SURGICAL HISTORY     polynidal cyst  . REPLACEMENT TOTAL KNEE  2009   left 2009; right 2014  . SPINAL CORD STIMULATOR IMPLANT     to control back pain   . TOTAL HIP ARTHROPLASTY Right 08/08/2014   Procedure: RIGHT TOTAL HIP ARTHROPLASTY ANTERIOR APPROACH;  Surgeon: Kathryne Hitch, MD;  Location: [facility];  Service: Orthopedics;  Laterality: Right;  . TOTAL HIP ARTHROPLASTY Left 12/11/2015   Procedure: LEFT TOTAL HIP ARTHROPLASTY ANTERIOR APPROACH;  Surgeon: Kathryne Hitch, MD;  Location: [facility];  Service: Orthopedics;  Laterality: Left;  . UMBILICAL HERNIA REPAIR         Family History  Problem Relation Age of Onset  . Heart attack Father   . Esophageal cancer Mother   . Other Mother        Tachycardia  . Diabetes Brother     Social History   Tobacco Use  . Smoking status: Former Smoker    Packs/day: 2.00    Years: 45.00    Pack years: 90.00    Types: Cigarettes    Quit date: 07/19/2017    Years since quitting: 2.0  . Smokeless tobacco: Never Used  Substance Use Topics  . Alcohol use: No    Alcohol/week: 0.0 standard drinks    Comment: recovering alcoholic - none since 2012   . Drug use: No    Comment: hx of marijuana in teenage years    Home  Medications Prior to Admission medications   Medication Sig Start Date End Date Taking? Authorizing Provider  albuterol (PROAIR HFA) 108 (90 Base) MCG/ACT inhaler Inhale 2 puffs into the lungs every 6 (six) hours as needed for wheezing.    [provider]  aspirin EC 81 MG tablet Take 1 tablet (81 mg total) by mouth daily. 05/04/17   Hongalgi, Maximino Greenland, MD  Calcium Carb-Cholecalciferol (CALCIUM 600 + D PO) Take 1 tablet by mouth 3 (three) times daily.     [provider]  Cyanocobalamin (B-12) 500 MCG SUBL Place 500 mcg under the  tongue See admin instructions. Take one tablet (500 mcg) sublingually twice weekly - Monday and Thursday    [provider]  diclofenac sodium (VOLTAREN) 1 % GEL Apply 4 g topically 4 (four) times daily as needed. 12/06/17   Hilts, Casimiro Needle, MD  fluticasone furoate-vilanterol (BREO ELLIPTA) 100-25 MCG/INH AEPB Inhale 1 puff into the lungs daily. 02/25/19   Coralyn Helling, MD  fluticasone furoate-vilanterol (BREO ELLIPTA) 100-25 MCG/INH AEPB Inhale 1 puff into the lungs daily. 02/25/19   Coralyn Helling, MD  folic acid (FOLVITE) 400 MCG tablet Take 800 mcg by mouth daily.    [provider]  gabapentin (NEURONTIN) 300 MG capsule Take 300 mg by mouth 4 (four) times daily.  12/27/16   [provider]  HYDROcodone-acetaminophen (NORCO/VICODIN) 5-325 MG tablet Take 1 tablet by mouth every 6 (six) hours as needed for severe pain. 07/29/19   Gwyneth Sprout, MD  hydroxychloroquine (PLAQUENIL) 200 MG tablet Take 200 mg by mouth 2 (two) times daily.    [provider]  methotrexate (RHEUMATREX) 2.5 MG tablet Take 1 tablet by mouth once a week. For 8 weeks 10/21/18   [provider]  Multiple Vitamin (MULTIVITAMIN WITH MINERALS) TABS tablet Take 1 tablet by mouth 2 (two) times daily.    [provider]  pantoprazole (PROTONIX) 20 MG tablet Take 1 tablet (20 mg total) by mouth daily for 21 days. 07/29/19 08/19/19  Carroll Sage, PA-C  rOPINIRole (REQUIP) 1 MG tablet Take 1 mg by mouth 3 (three) times daily as needed (restless legs).     [provider]  Tamsulosin HCl (FLOMAX) 0.4 MG CAPS Take 0.4 mg by mouth daily.     [provider]  traZODone (DESYREL) 100 MG tablet TAKE 1 TABLET BY MOUTH EVERYDAY AT BEDTIME 03/25/19   Coralyn Helling, MD  trospium (SANCTURA) 20 MG tablet Take 20 mg by mouth 2 (two) times daily. 04/16/18   [provider]    Allergies    Atenolol and Sulfa antibiotics  Review of Systems   Review of Systems  Constitutional: Positive for appetite change. Negative for chills, diaphoresis and fever.       Decreased appetite  HENT: Negative for congestion and sore throat.   Eyes: Negative for pain.  Respiratory: Negative for cough and shortness of breath.   Cardiovascular: Negative for chest pain and leg swelling.  Gastrointestinal: Positive for abdominal pain and diarrhea. Negative for constipation, nausea and vomiting.       Patient had some liquid stools on Sunday patient states is because he was taking laxatives.  Had normal stools today denies seeing any blood in it.  Genitourinary: Negative for dysuria, enuresis and flank pain.  Musculoskeletal: Negative for back pain.  Skin: Negative for rash.  Neurological: Negative for dizziness and headaches.  Hematological: Does not bruise/bleed easily.    Physical Exam Updated Vital Signs BP (!) 144/82   Pulse 65   Temp 97.8 F (36.6 C) (Oral)   Resp 18   Ht 5\' 9"  (1.753 m)   Wt 129.3 kg   SpO2 98%   BMI 42.09 kg/m   Physical Exam Vitals and nursing note reviewed.  Constitutional:      General: He is not in acute distress.    Appearance: He is not ill-appearing.  HENT:     Head: Normocephalic and atraumatic.     Nose: No congestion.     Mouth/Throat:     Mouth: Mucous membranes are moist.  Pharynx: Oropharynx is clear.  Eyes:     General: No scleral icterus. Cardiovascular:     Rate and  Rhythm: Normal rate and regular rhythm.     Pulses: Normal pulses.     Heart sounds: No murmur. No friction rub. No gallop.   Pulmonary:     Effort: No respiratory distress.     Breath sounds: No wheezing, rhonchi or rales.  Abdominal:     General: There is no distension.     Palpations: Abdomen is soft.     Tenderness: There is abdominal tenderness. There is no right CVA tenderness, left CVA tenderness, guarding or rebound.     Comments: Patient had epigastric tenderness to palpation, dull to percussion, nondistended, normoactive bowel sounds.  No signs of acute abdomen.  Musculoskeletal:        General: No swelling.  Skin:    General: Skin is warm and dry.     Findings: No rash.  Neurological:     Mental Status: He is alert and oriented to person, place, and time.  Psychiatric:        Mood and Affect: Mood normal.     ED Results / Procedures / Treatments   Labs (all labs ordered are listed, but only abnormal results are displayed) Labs Reviewed  COMPREHENSIVE METABOLIC PANEL - Abnormal; Notable for the following components:      Result Value   Sodium 133 (*)    Chloride 94 (*)    Glucose, Bld 104 (*)    All other components within normal limits  CBC WITH DIFFERENTIAL/PLATELET - Abnormal; Notable for the following components:   Monocytes Absolute 1.3 (*)    All other components within normal limits  LIPASE, BLOOD - Abnormal; Notable for the following components:   Lipase 52 (*)    All other components within normal limits    EKG None  Radiology CT ABDOMEN PELVIS W CONTRAST  Result Date: 07/29/2019 CLINICAL DATA:  Abdominal pain, hepatomegaly EXAM: CT ABDOMEN AND PELVIS WITH CONTRAST TECHNIQUE: Multidetector CT imaging of the abdomen and pelvis was performed using the standard protocol following bolus administration of intravenous contrast. CONTRAST:  178mL OMNIPAQUE IOHEXOL 300 MG/ML  SOLN COMPARISON:  CT chest, abdomen and pelvis 01/02/2018 FINDINGS: Lower chest:  Bandlike areas of reticular opacity in the right lung base, likely reflecting atelectasis and/or scarring. Some abundant mediastinal fat noted in the lung bases as well. Cardiac size at the upper limits of normal. Coronary artery atherosclerosis is noted. No pericardial effusion. Hepatobiliary: No worrisome focal liver lesions. Smooth liver surface contour. Normal hepatic attenuation. Surgical clip again seen adjacent the caudate lobe. The distal gallbladder is quite unremarkable however the proximal gallbladder demonstrates some faint pericholecystic inflammation of this may be redistributed from more focal inflammation along the pancreas and pancreaticoduodenal groove. Some mild biliary ductal thickening is present as well. No biliary ductal dilatation however. No visible intraductal gallstones. Pancreas: There is focal edematous change centered upon the pancreatic head with more fatty replacement of the body, tail and uncinate. Uniform pancreatic enhancement. Few surgical clips noted about the pancreatic body as well. No pancreatic ductal dilatation. Spleen: Normal in size without focal abnormality. Adrenals/Urinary Tract: Normal adrenal glands. Stable moderate perinephric stranding is seen bilaterally. The kidneys enhance and excrete symmetrically. Stable appearance of a fluid attenuation cyst which arises from the upper pole right kidney measuring up to 3.3 cm in size. Punctate nonobstructing calculus again seen in the lower pole left kidney. No obstructive urolithiasis or hydronephrosis.  Urinary bladder is grossly unremarkable within the limitations of streak artifact. Stomach/Bowel: Postsurgical changes along the greater curvature of the stomach, compatible with prior laparoscopic gastric sleeve. There is some focal thickening of the gastric antrum and proximal duodenum though possibly secondary to the pancreatic process. No other small bowel thickening or dilatation. No evidence of obstruction. A normal  appendix is visualized. No colonic dilatation or wall thickening. Vascular/Lymphatic: Atherosclerotic calcifications throughout the abdominal aorta and branch vessels. No aneurysm or ectasia. No enlarged abdominopelvic lymph nodes. Reactive adenopathy noted in the upper abdomen and porta hepatis. Reproductive: Coarse eccentric calcification of the prostate. No concerning abnormalities of the prostate or seminal vesicles. Other: Inflammatory changes centered upon the pancreatic head and pancreaticoduodenal groove. Perinephric stranding, stable from prior, as detailed above. No abdominopelvic free air or fluid. No organized collection or abscess. No bowel containing hernia. Musculoskeletal: Mild levocurvature at the thoracolumbar junction. Prior L1-L3 posterior spinal fusion without hardware complication. Advanced degenerative changes in the L3-4 and L5-S1 levels with evidence of bony fusion of the posterior elements and prior posterior decompression. In the thoracic spine there is multilevel flowing anterior osteophytosis, compatible with features of diffuse idiopathic skeletal hyperostosis (DISH). Prior bilateral total hip arthroplasties without complication. Prior bone graft harvest of the right ilium. Features of likely prior osteitis pubis. No acute osseous abnormality or suspicious osseous lesion. IMPRESSION: 1. Focal edematous change centered upon the pancreatic head and pancreaticoduodenal groove. Some focal thickening of the gastric antrum and proximal duodenum is present and favored to be reactive. Findings are favored to represent acute interstitial edematous pancreatitis. Recommend correlation with lipase and clinical exam findings to exclude the possibility of a superimposed gastritis/duodenitis. 2. Some mild wall thickening thickening of the proximal gallbladder and biliary tree, potentially reactive though could consider further evaluation with right upper quadrant ultrasound. 3. Punctate nonobstructing  left nephrolithiasis. 4. Postsurgical changes along the greater curvature of the stomach, compatible with prior laparoscopic gastric sleeve. 5. Extensive postsurgical changes of the spine and bilateral hips. No acute complication is evident. 6. Aortic Atherosclerosis (ICD10-I70.0). Electronically Signed   By: Kreg Shropshire M.D.   On: 07/29/2019 22:06    Procedures Procedures (including critical care time)  Medications Ordered in ED Medications  alum & mag hydroxide-simeth (MAALOX/MYLANTA) 200-200-20 MG/5ML suspension 30 mL (30 mLs Oral Given 07/29/19 2035)    And  lidocaine (XYLOCAINE) 2 % viscous mouth solution 15 mL (15 mLs Oral Given 07/29/19 2035)  fentaNYL (SUBLIMAZE) injection 50 mcg (50 mcg Intravenous Given 07/29/19 2200)  pantoprazole (PROTONIX) injection 40 mg (40 mg Intravenous Given 07/29/19 2202)  iohexol (OMNIPAQUE) 300 MG/ML solution 100 mL (100 mLs Intravenous Contrast Given 07/29/19 2147)  HYDROcodone-acetaminophen (NORCO/VICODIN) 5-325 MG per tablet 1 tablet (1 tablet Oral Given 07/29/19 2254)    ED Course  I have reviewed the triage vital signs and the nursing notes.  Pertinent labs & imaging results that were available during my care of the patient were reviewed by me and considered in my medical decision making (see chart for details).  Clinical Course as of Jul 29 2310  Mon Jul 29, 2019  2132 Lipase, blood(!) [WF]    Clinical Course User Index [WF] Barnie Del   MDM Rules/Calculators/A&P                     I personally reviewed all labs and imaging and have interpreted them.    CBC did not show any signs of infection or anemia.  Patient had a lipase of 52.  CMP showed a sodium of 133, chloride of 94 and no signs of AKI or elevated liver enzymes.  CT abdomen showed acute interstitial edematous pancreatitis and some mild wall thickening of the proximal gallbladder and biliary tree.  Patient's abdomen was reassuring and did not show signs of acute abdomen,  patient denies any nausea, vomiting, bloody diarrhea and is able to hold food down.  It is unlikely that the patient's symptoms are result of abdominal obstruction, diverticulitis or kidney stones.  It is also likely that the patient since some as a result of a lower lobe pneumonia, as he was afebrile, nontachypneic and did not complain of any respiratory symptoms.    It is likely that the patient's symptoms are related to interstitial edematous pancreatitis.  Patient does not meet criteria for emergent admission to the hospital.  Patient was given return criteria and instructed to follow-up with PCP for further evaluation.  Patient stated he understood results and plan above.   Final Clinical Impression(s) / ED Diagnoses Final diagnoses:  Epigastric pain    Rx / DC Orders ED Discharge Orders         Ordered    pantoprazole (PROTONIX) 20 MG tablet  Daily     07/29/19 2231    HYDROcodone-acetaminophen (NORCO/VICODIN) 5-325 MG tablet  Every 6 hours PRN     07/29/19 2246           Carroll Sage, PA-C 07/29/19 2313    Gwyneth Sprout, MD 07/30/19 2053

## 2019-07-29 NOTE — ED Triage Notes (Signed)
Abdomen pain since Friday, Denies n/v/d.

## 2019-09-25 ENCOUNTER — Other Ambulatory Visit: Payer: Self-pay | Admitting: Pulmonary Disease

## 2019-09-25 NOTE — Telephone Encounter (Signed)
Pt is requesting refill on trazodone is ok to fill

## 2019-10-15 ENCOUNTER — Ambulatory Visit (INDEPENDENT_AMBULATORY_CARE_PROVIDER_SITE_OTHER): Payer: Medicare HMO

## 2019-10-15 ENCOUNTER — Encounter: Payer: Self-pay | Admitting: Orthopaedic Surgery

## 2019-10-15 ENCOUNTER — Ambulatory Visit: Payer: Medicare HMO | Admitting: Orthopaedic Surgery

## 2019-10-15 DIAGNOSIS — Z96652 Presence of left artificial knee joint: Secondary | ICD-10-CM

## 2019-10-15 DIAGNOSIS — Z96642 Presence of left artificial hip joint: Secondary | ICD-10-CM | POA: Diagnosis not present

## 2019-10-15 NOTE — Progress Notes (Signed)
Office Visit Note   Patient: Anthony Friedman           Date of Birth: [birth year]           MRN: [id number] Visit Date: 10/15/2019              Requested by: Herma Carson, MD [address] [place],  [place] [address] PCP: Herma Carson, MD   Assessment & Plan: Visit Diagnoses:  1. Status post left hip replacement   2. History of left knee replacement     Plan:  We will send him to physical therapy to work on quad strengthening.  See him back in about 4 weeks to see how he is doing.  Questions were encouraged and answered at length.  Follow-Up Instructions: No follow-ups on file.   Orders:  Orders Placed This Encounter  Procedures  . XR Knee 1-2 Views Left  . XR HIP UNILAT W OR W/O PELVIS 1V LEFT   No orders of the defined types were placed in this encounter.     Procedures: No procedures performed   Clinical Data: No additional findings.   Subjective: Chief Complaint  Patient presents with  . Left Knee - Follow-up  . Left Hip - Follow-up    HPI Mr. Buchta returns today follow-up of his left knee left hip.  He states both hips are doing well.  He is having still some pain in the left knee.  He states is unchanged.  He has had no injury.  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 Patient did undergo polyexchange left knee in 2018 went from size 12.5 to 50 mm.  States that he does not feel like it is going to give way on him but he still having pain in the anterior aspect of the knee.  He also underwent bone scan in 2018 which showed no obvious loosening of the left total knee.  There was some questionable loosening of the right patellar component.  He has no pain in the right knee.  Review of Systems  Constitutional: Negative for chills and fever.  Musculoskeletal: Positive for arthralgias.     Objective: Vital Signs: There were no vitals taken for this visit.  Physical  Exam Constitutional:      Appearance: He is not ill-appearing or diaphoretic.  Pulmonary:     Effort: Pulmonary effort is normal.  Neurological:     Mental Status: He is alert and oriented to person, place, and time.  Psychiatric:        Behavior: Behavior normal.     Ortho Exam Bilateral hips excellent range of motion without pain.  Left knee good range of motion with pain.  He has tenderness over the quad tendon with palpation of left knee no abnormal warmth erythema or effusion no instability valgus varus stressing of either knee.  Right knee excellent range of motion without pain.  Anterior drawer negative on the left slight drawer sign on the right.  Lachman's left knee is slightly positive.  The right is negative.  These findings most likely due to patient's guarding.  Surgical incisions both knees well-healed.  Specialty Comments:  No specialty comments available.  Imaging: No results found.   PMFS History: Patient Active Problem List   Diagnosis Date Noted  . Cellulitis of left hand 05/02/2017  . Left wrist pain 05/01/2017  . Polyethylene liner wear following  total knee arthroplasty requiring isolated polyethylene liner exchange (HCC) 11/04/2016  . Leg hematoma, left, initial encounter 11/02/2016  . OSA (obstructive sleep apnea) 02/02/2016  . Osteoarthritis of left hip 12/11/2015  . Status post left hip replacement 12/11/2015  . Bradycardia 10/28/2015  . Morbid obesity (HCC) 10/26/2015  . Pre-op evaluation 04/16/2015  . Osteoarthritis of right hip 08/08/2014  . Status post total replacement of right hip 08/08/2014  . 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 Edema, peripheral 10/29/2013  . Insufficiency, vascular 10/29/2013  . Cervical osteoarthritis 09/13/2013  . Cervical pain 09/05/2013  . 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 Benign prostatic hypertrophy without urinary obstruction 04/04/2013  . Restless leg 04/04/2013  . Diabetes mellitus, type 2 (HCC) 04/04/2013  .  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 Anxiety 12/20/2012  . DDD (degenerative disc disease), lumbosacral 12/20/2012  . 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 Varicose veins of both legs with edema 10/25/2012  . Acquired spondylolisthesis 08/23/2012  . Chronic pain 08/11/2012  . Excessive urination at night 08/11/2012  . RA (rheumatoid arthritis) (HCC)   . Chronic venous insufficiency   . COPD gold stage C.   . Obesity   . Hypertension   . Chronic allergic rhinitis    Past Medical History:  Diagnosis Date  . Atelectasis   . 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 Chronic allergic rhinitis   . Chronic venous insufficiency    superficial  . COPD (chronic obstructive pulmonary disease) (HCC)   . Cough   . Diabetes mellitus without complication (HCC)    not on medications since  surgery 10/2015   . Edema    lower extremities  . Elevated liver enzymes    d/t arava  . Family history of adverse reaction to anesthesia    pts mother had difficulty with anesthesia - pt not sure what difficulties were   . Hypertension    no longer on blood pressure medications since 10/25/2015 per patient   . Kidney stones    hx of   . Leg pain   . Liver enlargement    d/t arava  . Obesity   . Pallor   . Pneumonia    hx of times 3; pt states is current with pneumonia vaccine  . RA (rheumatoid arthritis) (HCC)     rheumatoid arthritis   . Sleep apnea    say he uses a mouth guard that incremently increases airflow   . SOBOE (shortness of breath on exertion)   . Superficial thrombophlebitis    subacute-on anticoagulation  . Urinary frequency   . Urinary tract bacterial infections    hx of   . Venous stasis    changes    Family History  Problem Relation Age of Onset  . Heart attack Father   . Esophageal cancer Mother   . Other Mother        Tachycardia  . Diabetes Brother     Past Surgical History:  Procedure Laterality Date  . HAND SURGERY   1965   right hand  . I & D EXTREMITY Left 05/03/2017   Procedure: IRRIGATION AND DEBRIDEMENT EXTREMITY;  Surgeon: Roby Lofts, MD;  Location: [facility];  Service: Orthopedics;  Laterality: Left;  . I & D KNEE WITH POLY EXCHANGE Left 11/04/2016   Procedure: LEFT KNEE POLY EXCHANGE;  Surgeon: Kathryne Hitch, MD;  Location: [facility];  Service: Orthopedics;  Laterality: Left;  . KNEE SURGERY  arthroscopic right and left knee  . LAMINECTOMY  1988   and fusion; L4, L5, S1  . LAMINECTOMY  2007   and fusion L1, L2, L3  . LAPAROSCOPIC GASTRIC SLEEVE RESECTION N/A 10/26/2015   Procedure: LAPAROSCOPIC GASTRIC SLEEVE RESECTION, UPPER ENDO;  Surgeon: Ovidio Kin, MD;  Location: [facility];  Service: General;  Laterality: N/A;  . OTHER SURGICAL HISTORY     polynidal cyst  . REPLACEMENT TOTAL KNEE  2009   left 2009; right 2014  . SPINAL CORD STIMULATOR IMPLANT     to control back pain   . TOTAL HIP ARTHROPLASTY Right 08/08/2014   Procedure: RIGHT TOTAL HIP ARTHROPLASTY ANTERIOR APPROACH;  Surgeon: Kathryne Hitch, MD;  [facility];  Service: Orthopedics;  Laterality: Right;  . TOTAL HIP ARTHROPLASTY Left 12/11/2015   Procedure: LEFT TOTAL HIP ARTHROPLASTY ANTERIOR APPROACH;  Surgeon: Kathryne Hitch, MD;  Location[facility];  Service: Orthopedics;  Laterality: Left;  . UMBILICAL HERNIA REPAIR     Social History   Occupational History  . Occupation: disabled     Comment: former Chief Financial Officer, office work   Tobacco Use  . Smoking status: Former Smoker    Packs/day: 2.00    Years: 45.00    Pack years: 90.00    Types: Cigarettes    Quit date: 07/19/2017    Years since quitting: 2.2  . Smokeless tobacco: Never Used  Vaping Use  . Vaping Use: Never used  Substance and Sexual Activity  . Alcohol use: No    Alcohol/week: 0.0 standard drinks    Comment: recovering alcoholic - none since 2012   . Drug use: No    Comment: hx of marijuana in teenage years  . Sexual activity:  Not on file

## 2019-10-16 ENCOUNTER — Other Ambulatory Visit: Payer: Self-pay | Admitting: Radiology

## 2019-10-16 DIAGNOSIS — Z96652 Presence of left artificial knee joint: Secondary | ICD-10-CM

## 2019-10-16 DIAGNOSIS — G8929 Other chronic pain: Secondary | ICD-10-CM

## 2019-10-29 ENCOUNTER — Other Ambulatory Visit: Payer: Self-pay

## 2019-10-29 ENCOUNTER — Ambulatory Visit: Payer: Medicare HMO | Attending: Physician Assistant | Admitting: Physical Therapy

## 2019-10-29 DIAGNOSIS — M6281 Muscle weakness (generalized): Secondary | ICD-10-CM | POA: Diagnosis present

## 2019-10-29 DIAGNOSIS — M25562 Pain in left knee: Secondary | ICD-10-CM | POA: Diagnosis present

## 2019-10-29 DIAGNOSIS — G8929 Other chronic pain: Secondary | ICD-10-CM

## 2019-10-29 DIAGNOSIS — R2689 Other abnormalities of gait and mobility: Secondary | ICD-10-CM | POA: Diagnosis present

## 2019-10-29 DIAGNOSIS — R262 Difficulty in walking, not elsewhere classified: Secondary | ICD-10-CM | POA: Insufficient documentation

## 2019-10-29 NOTE — Therapy (Addendum)
[facility] [place] [address]  [place], [place], [address] Phone: [phone]   Fax:  [phone]  Physical Therapy Evaluation / Discharge Summary  Patient Details  Name: Anthony Friedman MRN: [id number] Date of Birth: 08-Dec-1950 Referring Provider (PT): Erskine Emery, PA-C   Encounter Date: 10/29/2019   PT End of Session - 10/29/19 1310    Visit Number 1    Number of Visits 12    Date for PT Re-Evaluation 12/10/19    Authorization Type [employer] Medicare    PT Start Time 1310    PT Stop Time 1407    PT Time Calculation (min) 57 min    Activity Tolerance Patient tolerated treatment well    Behavior During Therapy [facility] for tasks assessed/performed           Past Medical History:  Diagnosis Date  . Atelectasis   . 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 Chronic allergic rhinitis   . Chronic venous insufficiency    superficial  . COPD (chronic obstructive pulmonary disease) ([place])   . Cough   . Diabetes mellitus without complication ([place])    not on medications since  surgery 10/2015   . Edema    lower extremities  . Elevated liver enzymes    d/t arava  . Family history of adverse reaction to anesthesia    pts mother had difficulty with anesthesia - pt not sure what difficulties were   . Hypertension    no longer on blood pressure medications since 10/25/2015 per patient   . Kidney stones    hx of   . Leg pain   . Liver enlargement    d/t arava  . Obesity   . Pallor   . Pneumonia    hx of times 3; pt states is current with pneumonia vaccine  . RA (rheumatoid arthritis) (HCC)     rheumatoid arthritis   . Sleep apnea    say he uses a mouth guard that incremently increases airflow   . SOBOE (shortness of breath on exertion)   . Superficial thrombophlebitis    subacute-on anticoagulation  . Urinary frequency   . Urinary tract bacterial infections    hx of   . Venous  stasis    changes    Past Surgical History:  Procedure Laterality Date  . HAND SURGERY  1965   right hand  . I & D EXTREMITY Left 05/03/2017   Procedure: IRRIGATION AND DEBRIDEMENT EXTREMITY;  Surgeon: Shona Needles, MD;  Location: [facility];  Service: Orthopedics;  Laterality: Left;  . I & D KNEE WITH POLY EXCHANGE Left 11/04/2016   Procedure: LEFT KNEE POLY EXCHANGE;  Surgeon: Mcarthur Rossetti, MD;  [facility];  Service: Orthopedics;  Laterality: Left;  . KNEE SURGERY     arthroscopic right and left knee  . LAMINECTOMY  1988   and fusion; L4, L5, S1  . LAMINECTOMY  2007   and fusion L1, L2, L3  . LAPAROSCOPIC GASTRIC SLEEVE RESECTION N/A 10/26/2015   Procedure: LAPAROSCOPIC GASTRIC SLEEVE RESECTION, UPPER ENDO;  Surgeon: Alphonsa Overall, MD;  [facility];  Service: General;  Laterality: N/A;  . OTHER SURGICAL HISTORY     polynidal cyst  . REPLACEMENT TOTAL KNEE  2009   left 2009; right 2014  . SPINAL CORD STIMULATOR IMPLANT     to control back pain   . TOTAL HIP ARTHROPLASTY Right  08/08/2014   Procedure: RIGHT TOTAL HIP ARTHROPLASTY ANTERIOR APPROACH;  Surgeon: Mcarthur Rossetti, MD;  Location[facility];  Service: Orthopedics;  Laterality: Right;  . TOTAL HIP ARTHROPLASTY Left 12/11/2015   Procedure: LEFT TOTAL HIP ARTHROPLASTY ANTERIOR APPROACH;  Surgeon: Mcarthur Rossetti, MD;  [facility];  Service: Orthopedics;  Laterality: Left;  . UMBILICAL HERNIA REPAIR      There were no vitals filed for this visit.    Subjective Assessment - 10/29/19 1313    Subjective Pt reports his L knee has never been right since the initial TKA in 2014. Eventually had the liner from the TKA replaced in 2018, but still feels that the L knee is not right. On a rare occasional L knee will buckle. Gets best relief when walking in therapy pool.    Pertinent History B THA, B TKA, lumbar lami & fusion x 2, spinal cord stimulator, bariatric surgery    Limitations  Sitting;Standing;Walking;House hold activities    How long can you sit comfortably? gets up automatically    How long can you stand comfortably? 2 minutes    How long can you walk comfortably? 15-20 steps at a time    Diagnostic tests L knee x-ray 10/15/19: No acute fractures. No bony abnormalities. Well-seated implants without any obvious hardware failure.    Patient Stated Goals "to be pain free"    Currently in Pain? Yes    Pain Score 5    up to 9-10/10 at worst   Pain Location Knee    Pain Orientation Left;Upper;Anterior    Pain Descriptors / Indicators Throbbing;Dull    Pain Type Chronic pain    Pain Radiating Towards down to patellar tendon    Pain Onset Other (comment)   2014 following TKA   Pain Frequency Constant    Aggravating Factors  prolonged sitting    Pain Relieving Factors nothing    Effect of Pain on Daily Activities "affects just about everything"              [facility] PT Assessment - 10/29/19 1310      Assessment   Medical Diagnosis Chronic L knee pain s/p TKA    Referring Provider (PT) Erskine Emery, PA-C    Onset Date/Surgical Date --   2014   Next MD Visit Sept 2021    Prior Therapy PT after TKA but no PT for knee in ~4 years      Precautions   Precautions Other (comment)    Precaution Comments  lumbar fusion L1-S1, B THA, B TKA, COPD, [place] stimulator      Restrictions   Weight Bearing Restrictions No      Balance Screen   Has the patient fallen in the past 6 months No    Has the patient had a decrease in activity level because of a fear of falling?  No    Is the patient reluctant to leave their home because of a fear of falling?  No      Home Social worker Private residence    Living Arrangements Spouse/significant other    Available Help at Discharge Family    Type of [place] to enter    Entrance Stairs-Number of Steps 1    Entrance Stairs-Rails None    Home Layout One level    [place] -  single point;Grab bars - toilet;Grab bars - tub/shower;Shower seat - built in  Prior Function   Level of Independence Independent    Vocation Retired    Leisure putter around [employer]; no exercise since pandemic - was walking in therapy pool but gym has not yet re-opened      Cognition   Overall Cognitive Status Within Functional Limits for tasks assessed      Observation/Other Assessments   Focus on Therapeutic Outcomes (FOTO)  Knee - 50% (50% limitation); Predicted 58% (42% limitation)      Sensation   Light Touch Appears Intact   reports neuropathy in B feet     ROM / Strength   AROM / PROM / Strength AROM;Strength      AROM   AROM Assessment Site Knee    Right/Left Knee Left    Left Knee Extension 0    Left Knee Flexion 118      Strength   Overall Strength Comments tested in sitting    Strength Assessment Site Hip;Knee;Ankle    Right/Left Hip Right;Left    Right Hip Flexion 4+/5    Right Hip Extension 4/5    Right Hip External Rotation  4/5    Right Hip Internal Rotation 4+/5    Right Hip ABduction 4+/5    Right Hip ADduction 4+/5    Left Hip Flexion 4-/5    Left Hip Extension 4-/5    Left Hip External Rotation 4-/5    Left Hip Internal Rotation 3+/5    Left Hip ABduction 4/5    Left Hip ADduction 4-/5    Right/Left Knee Right;Left    Right Knee Flexion 5/5    Right Knee Extension 5/5    Left Knee Flexion 4/5    Left Knee Extension 4-/5    Right/Left Ankle Right;Left    Right Ankle Dorsiflexion 4-/5    Left Ankle Dorsiflexion 3+/5      Flexibility   Soft Tissue Assessment /Muscle Length yes   L hip adductor tightness   Hamstrings mod tightness L>R    ITB mod tightness L>R    Piriformis mild tightness      Palpation   Palpation comment ttp over L distal quads, entire VL, ITB, hip adductors, hip flexors and glutes with increased muscle tension and palpable muscle atrophy throughout      Ambulation/Gait   Assistive device Straight cane    Gait  Pattern Step-through pattern;Antalgic;Decreased weight shift to left;Decreased stance time - left;Decreased hip/knee flexion - left;Decreased hip/knee flexion - right;Decreased dorsiflexion - left    Ambulation Surface Level;Indoor    Gait velocity Decreased                      Objective measurements completed on examination: See above findings.               PT Education - 10/29/19 1407    Education Details PT eval findings, anticipated POC and initial HEP    Person(s) Educated Patient    Methods Explanation;Demonstration;Handout    Comprehension Verbalized understanding;Need further instruction            PT Short Term Goals - 10/29/19 1407      PT SHORT TERM GOAL #1   Title Patient to be independent with initial HEP    Status New    Target Date 11/19/19             PT Long Term Goals - 10/29/19 1848      PT LONG TERM GOAL #1   Title Patient to  be independent with advanced HEP    Status New    Target Date 12/10/19      PT LONG TERM GOAL #2   Title Patient to report L knee pain reduction in frequency and intensity by >/= 50%    Status New    Target Date 12/10/19      PT LONG TERM GOAL #3   Title Patient will demonstrate improved B LE strength to >/= 4 to 4+/5 for improved stability and ease of mobility    Status New    Target Date 12/10/19      PT LONG TERM GOAL #4   Title Patient will ambulate with normal gait pattern with decreased reliance on [facility] or no AD    Status New    Target Date 12/10/19                  Plan - 10/29/19 1407    Clinical Impression Statement Anthony Friedman is a 69 y/o male who present to OP PT for chronic L knee pain since L TKA in 2014. He reports he has seen multiple doctors for this and was told that all imaging shows the TKA hardware is intact and functioning appropriately - Dr. Ninfa Linden eventually did another surgery to replace the liner in 2018 thinking that it may have been inappropriately sized, but he  continues to note ongoing pain and instability in L knee therefore now referred to PT for quad strengthening. L knee AROM WFL for post-TKA ROM but mild to moderate weakness noted not only in L quads but proximally and distally as well throughout LEs, L>R, with pt unable to perform L SLR. Muscle atrophy noted on palpation with ttp throughout most proximal LE muscle groups. Weakness and pain cause pt to continue to rely on [facility] for all ambulation and limits activity tolerance for most daily tasks. Uriel will benefit from skilled PT intervention to address the above listed deficits, reduce pain, and restore functional strength to allow for improved knee stability for improved balance and gait to maximize function and safety with mobility within home and community.    Personal Factors and Comorbidities Time since onset of injury/illness/exacerbation;Past/Current Experience;Fitness;Comorbidity 3+    Comorbidities OA, RA, DDD, B TKA, B THA, lumbar surgery x 2, spinal cord stimulator, HTN, COPD, chronic venous insufficiency, morbid obesity s/p bariatric surgery, RLS, DM-II    Examination-Activity Limitations Bathing;Bed Mobility;Bend;Caring for Others;Carry;Locomotion Level;Sit;Squat;Stairs;Stand;Transfers    Examination-Participation Restrictions Cleaning;Community Activity;Driving;Shop    Stability/Clinical Decision Making Evolving/Moderate complexity    Clinical Decision Making Moderate    Rehab Potential Good    PT Frequency 2x / week    PT Duration 6 weeks    PT Treatment/Interventions ADLs/Self Care Home Management;Cryotherapy;Electrical Stimulation;Iontophoresis 4mg /ml Dexamethasone;Moist Heat;Ultrasound;DME Instruction;Gait training;Stair training;Functional mobility training;Therapeutic activities;Therapeutic exercise;Balance training;Neuromuscular re-education;Patient/family education;Manual techniques;[place]ar mobilization;Passive range of motion;Dry needling;Taping;Vasopneumatic Device;Joint Manipulations      PT Next Visit Plan Review initial HEP; LE strengthening with quad emphasis; manual therapy to address abnormal muscle tension; modalities PRN    Consulted and Agree with Plan of Care Patient           Patient will benefit from skilled therapeutic intervention in order to improve the following deficits and impairments:  Abnormal gait, Cardiopulmonary status limiting activity, Decreased activity tolerance, Decreased balance, Decreased endurance, Decreased knowledge of precautions, Decreased mobility, Decreased range of motion, Decreased strength, Difficulty walking, Increased fascial restricitons, Increased muscle spasms, Impaired perceived functional ability, Impaired flexibility, Obesity, Pain  Visit Diagnosis: Chronic pain of left  knee  Muscle weakness (generalized)  Difficulty in walking, not elsewhere classified  Other abnormalities of gait and mobility     Problem List Patient Active Problem List   Diagnosis Date Noted  . Cellulitis of left hand 05/02/2017  . Left wrist pain 05/01/2017  . Polyethylene liner wear following total knee arthroplasty requiring isolated polyethylene liner exchange ([place]) 11/04/2016  . Leg hematoma, left, initial encounter 11/02/2016  . OSA (obstructive sleep apnea) 02/02/2016  . Osteoarthritis of left hip 12/11/2015  . Status post left hip replacement 12/11/2015  . Bradycardia 10/28/2015  . Morbid obesity ([place]) 10/26/2015  . Pre-op evaluation 04/16/2015  . Osteoarthritis of right hip 08/08/2014  . Status post total replacement of right hip 08/08/2014  . 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 Edema, peripheral 10/29/2013  . Insufficiency, vascular 10/29/2013  . Cervical osteoarthritis 09/13/2013  . Cervical pain 09/05/2013  . 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 Benign prostatic hypertrophy without urinary obstruction 04/04/2013  . Restless leg 04/04/2013  . Diabetes mellitus, type 2 ([place]) 04/04/2013  . 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 Anxiety 12/20/2012   . DDD (degenerative disc disease), lumbosacral 12/20/2012  . 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 Varicose veins of both legs with edema 10/25/2012  . Acquired spondylolisthesis 08/23/2012  . Chronic pain 08/11/2012  . Excessive urination at night 08/11/2012  . RA (rheumatoid arthritis) ([place])   . Chronic venous insufficiency   . COPD gold stage C.   . Obesity   . Hypertension   . Chronic allergic rhinitis     Percival Spanish, PT, MPT 10/29/2019, 7:07 PM  [facility] [address]  [place], [place], [address] Phone: [phone]   Fax:  [phone]  Name: Anthony Friedman MRN: [id number] Date of Birth: 04/27/1950   PHYSICAL THERAPY DISCHARGE SUMMARY  Visits from Start of Care: 1  Current functional level related to goals / functional outcomes:   Refer to above eval. Patient cancelled all subsequent visits stating he "wants to go a different direction".   Remaining deficits:   As above.    Education / Equipment:   Initial HEP  Plan: Patient agrees to discharge.  Patient goals were not met. Patient is being discharged due to not returning since the last visit.  ?????     Percival Spanish, PT, MPT 12/02/19, 9:10 AM  [facility] [address]  [place], [place], [address] Phone: [phone]   Fax:  [phone]

## 2019-10-29 NOTE — Patient Instructions (Signed)
    Home exercise program created by Abreanna Drawdy, PT.  For questions, please contact Media Pizzini via phone at [phone] or email at [email or web address]  [facility] [place] [address]  [address] [place], [place], [address] Phone: [phone]   Fax:  [phone]    

## 2019-11-04 ENCOUNTER — Encounter: Payer: Self-pay | Admitting: Orthopaedic Surgery

## 2019-11-05 ENCOUNTER — Ambulatory Visit: Payer: Medicare HMO

## 2019-11-14 ENCOUNTER — Encounter: Payer: Medicare HMO | Admitting: Physical Therapy

## 2019-11-21 ENCOUNTER — Encounter: Payer: Medicare HMO | Admitting: Physical Therapy

## 2019-11-26 ENCOUNTER — Encounter: Payer: Medicare HMO | Admitting: Physical Therapy

## 2019-11-28 ENCOUNTER — Encounter: Payer: Medicare HMO | Admitting: Physical Therapy

## 2019-12-02 ENCOUNTER — Encounter: Payer: Medicare HMO | Admitting: Physical Therapy

## 2020-01-17 ENCOUNTER — Other Ambulatory Visit (HOSPITAL_COMMUNITY): Payer: Self-pay | Admitting: Rheumatology

## 2020-01-23 ENCOUNTER — Other Ambulatory Visit: Payer: Self-pay

## 2020-01-23 ENCOUNTER — Ambulatory Visit: Payer: Medicare HMO | Admitting: Orthopaedic Surgery

## 2020-01-23 DIAGNOSIS — T8484XD Pain due to internal orthopedic prosthetic devices, implants and grafts, subsequent encounter: Secondary | ICD-10-CM

## 2020-01-23 DIAGNOSIS — Z96652 Presence of left artificial knee joint: Secondary | ICD-10-CM

## 2020-01-23 NOTE — Progress Notes (Signed)
The patient comes in today with continued complaints of a painful left total knee arthroplasty.  I have replaced both his hips and his knees replaced elsewhere.  His left knee has never been right per the patient in terms of the pain he is having.  He has never had any issues with his hips or his right knee.  I did perform a polyliner exchange a few years ago with that left knee to upsize the polydue to his ligamentous instability.  He says that help from an instability standpoint but not from a pain standpoint.  I did not find any evidence of prosthetic loosening at the time.  Examination of his left knee today shows global tenderness.  There is no redness and no effusion.  The knee feels stable on my exam but it is very tender globally.  The remainder of his joints seem to be doing well.  He still walks with a walking stick and has been still losing weight.  At this point I would like to obtain a three-phase bone scan to rule out prosthetic loosening of the left knee.  He will follow-up after the study is done.  He agrees with [place] pursuing that study as well.

## 2020-02-06 ENCOUNTER — Ambulatory Visit (HOSPITAL_COMMUNITY)
Admission: RE | Admit: 2020-02-06 | Discharge: 2020-02-06 | Disposition: A | Discharge status: Home / Self Care | Payer: Medicare HMO | Source: Ambulatory Visit | Attending: Orthopaedic Surgery | Admitting: Orthopaedic Surgery

## 2020-02-06 ENCOUNTER — Encounter (HOSPITAL_COMMUNITY)
Admission: RE | Admit: 2020-02-06 | Discharge: 2020-02-06 | Disposition: A | Discharge status: Home / Self Care | Payer: Medicare HMO | Source: Ambulatory Visit | Attending: Orthopaedic Surgery | Admitting: Orthopaedic Surgery

## 2020-02-06 ENCOUNTER — Other Ambulatory Visit: Payer: Self-pay

## 2020-02-06 DIAGNOSIS — T8484XD Pain due to internal orthopedic prosthetic devices, implants and grafts, subsequent encounter: Secondary | ICD-10-CM | POA: Insufficient documentation

## 2020-02-06 DIAGNOSIS — Z96652 Presence of left artificial knee joint: Secondary | ICD-10-CM | POA: Diagnosis present

## 2020-02-06 MED ORDER — TECHNETIUM TC 99M MEDRONATE IV KIT
21.7000 | PACK | Freq: Once | INTRAVENOUS | Status: AC | PRN
  Administered 2020-02-06: 21.7 via INTRAVENOUS

## 2020-02-11 ENCOUNTER — Telehealth: Payer: Self-pay | Admitting: Orthopaedic Surgery

## 2020-02-11 NOTE — Telephone Encounter (Signed)
Please advise, bone scan results in chart

## 2020-02-11 NOTE — Telephone Encounter (Signed)
Pt called asking if dr Magnus Ivan could call him regarding his test results.

## 2020-03-19 ENCOUNTER — Ambulatory Visit: Payer: Medicare HMO | Admitting: Orthopaedic Surgery

## 2020-03-27 ENCOUNTER — Other Ambulatory Visit: Payer: Self-pay | Admitting: Pulmonary Disease

## 2020-03-27 NOTE — Telephone Encounter (Signed)
LMTCB to schedule appt for any refills on Trazodone.

## 2020-03-27 NOTE — Telephone Encounter (Signed)
Dr. Craige Cotta:  Pt requesting refill on Trazodone 100 mg Last ov:12/27/18 Pt has not been seen since then. Last fill:09/25/19   #90 with 1 refill.

## 2020-05-28 MED FILL — ENBREL SURECLICK 50 MG/ML S: 50 | 28 days supply | Qty: 4 | Fill #0

## 2020-06-15 ENCOUNTER — Other Ambulatory Visit (HOSPITAL_COMMUNITY): Payer: Self-pay

## 2020-06-15 MED FILL — Etanercept Subcutaneous Solution Auto-injector 50 MG/ML: SUBCUTANEOUS | 28 days supply | Qty: 4 | Fill #0 | Status: CN

## 2020-06-17 ENCOUNTER — Other Ambulatory Visit (HOSPITAL_COMMUNITY): Payer: Self-pay

## 2020-06-17 MED FILL — Etanercept Subcutaneous Solution Auto-injector 50 MG/ML: SUBCUTANEOUS | 28 days supply | Qty: 4 | Fill #0 | Status: AC

## 2020-06-18 ENCOUNTER — Other Ambulatory Visit (HOSPITAL_COMMUNITY): Payer: Self-pay

## 2020-06-22 ENCOUNTER — Other Ambulatory Visit: Payer: Self-pay

## 2020-06-22 ENCOUNTER — Ambulatory Visit: Payer: Medicare HMO | Admitting: Pulmonary Disease

## 2020-06-22 ENCOUNTER — Encounter: Payer: Self-pay | Admitting: Pulmonary Disease

## 2020-06-22 VITALS — BP 124/82 | HR 68 | Temp 97.6°F | Ht 69.0 in | Wt 251.8 lb

## 2020-06-22 DIAGNOSIS — G4730 Sleep apnea, unspecified: Secondary | ICD-10-CM | POA: Diagnosis not present

## 2020-06-22 DIAGNOSIS — J449 Chronic obstructive pulmonary disease, unspecified: Secondary | ICD-10-CM

## 2020-06-22 DIAGNOSIS — G4733 Obstructive sleep apnea (adult) (pediatric): Secondary | ICD-10-CM

## 2020-06-22 DIAGNOSIS — G4700 Insomnia, unspecified: Secondary | ICD-10-CM

## 2020-06-22 DIAGNOSIS — E669 Obesity, unspecified: Secondary | ICD-10-CM

## 2020-06-22 MED ORDER — TRAZODONE HCL 100 MG PO TABS: 100.0000 mg | ORAL_TABLET | Freq: Every evening | ORAL | 1 refills | Status: AC | PRN

## 2020-06-22 NOTE — Patient Instructions (Signed)
Follow up in 1 year.

## 2020-06-22 NOTE — Progress Notes (Signed)
[facility]  Chief Complaint  Patient presents with  . Follow-up    Doing well.    Constitutional:  BP 124/82 (BP Location: Left Arm, Cuff Size: Normal)   Pulse 68   Temp 97.6 F (36.4 C) (Oral)   Ht 5\' 9"  (1.753 m)   Wt 251 lb 12.8 oz (114.2 kg)   SpO2 99%   BMI 37.18 kg/m   Past Medical History:  RA, PNA, Nephrolithiasis, HTN, DM  Past Surgical History:  He  has a past surgical history that includes Hand surgery (1965); Knee surgery; Other surgical history; Umbilical hernia repair; Laminectomy (1988); Laminectomy (2007); Replacement total knee (2009); Spinal cord stimulator implant; Total hip arthroplasty (Right, 08/08/2014); Laparoscopic gastric sleeve resection (N/A, 10/26/2015); Total hip arthroplasty (Left, 12/11/2015); I & D knee with poly exchange (Left, 11/04/2016); and I & D extremity (Left, 05/03/2017).  Brief Summary:  Anthony Friedman is a 70 y.o. male former smoker with obstructive sleep apnea, insomnia and COPD.      Subjective:   I last saw him in 2020.    He didn't need to have dental extractions, and therefore has been able to continue using oral appliance.  He hasn't used inhalers for a while.  Didn't help, and too expensive.  Not having cough, wheeze, or sputum.  Uses trazodone few times per week and this helps his sleep.  Physical Exam:   Appearance - well kempt   ENMT - no sinus tenderness, no oral exudate, no LAN, Mallampati 3 airway, no stridor  Respiratory - equal breath sounds bilaterally, no wheezing or rales  CV - s1s2 regular rate and rhythm, no murmurs  Ext - no clubbing, no edema  Skin - no rashes  Psych - normal mood and affect   Pulmonary testing:   PFT 06/21/10 >>FEV11.94 (60%), FEV1% 68, TLC 4.11 (64%), DLCO67%  Sleep Tests:   HST 09/27/17 >> 10.9, SaO2 low 83% (while using oral appliance)  Cardiac Tests:   Echo 10/28/15 >> mod LVH, EF 60 to 65%, grade 2 DD  Social History:  He   reports that he quit smoking about 2 years ago. His smoking use included cigarettes. He has a 90.00 pack-year smoking history. He has never used smokeless tobacco. He reports that he does not drink alcohol and does not use drugs.  Family History:  His family history includes Diabetes in his brother; Esophageal cancer in his mother; Heart attack in his father; Other in his mother.     Assessment/Plan:   Obstructive sleep apnea. - treated with oral appliance  Obesity. - he is aware of how his weight can impact his health  COPD. - mild - prn albuterol  Insomnia. - will refill trazodone - asked for him to check with his PCP if she can assume prescriptions for trazodone; if so, then he wouldn't need follow up with 10/30/15  Rheumatoid arthritis. - followed by Dr. [place] with rheumatology  Time Spent Involved in Patient Care on Day of Examination:  22 minutes  Follow up:  Patient Instructions  Follow up in 1 year   Medication List:   Allergies as of 06/22/2020      Reactions   Atenolol Other (See Comments)   Lowers heart rate   Sulfa Antibiotics Other (See Comments)   Blood in urine      Medication List       Accurate as of June 22, 2020  3:00 PM. If you have any questions, ask  your nurse or doctor.        STOP taking these medications   Breo Ellipta 100-25 MCG/INH Aepb Generic drug: fluticasone furoate-vilanterol Stopped by: Coralyn Helling, MD   Humira Pen 40 MG/0.4ML Pnkt Generic drug: Adalimumab Stopped by: Coralyn Helling, MD   HYDROcodone-acetaminophen 5-325 MG tablet Commonly known as: NORCO/VICODIN Stopped by: Coralyn Helling, MD     TAKE these medications   aspirin EC 81 MG tablet Take 1 tablet (81 mg total) by mouth daily.   b complex vitamins tablet Take by mouth.   CALCIUM 600 + D PO Take 1 tablet by mouth 3 (three) times daily.   cyanocobalamin 100 MCG tablet Take by mouth.   diclofenac sodium 1 % Gel Commonly known as: VOLTAREN Apply 4 g  topically 4 (four) times daily as needed.   DULoxetine 30 MG capsule Commonly known as: CYMBALTA Take 30 mg by mouth daily.   Enbrel SureClick 50 MG/ML injection Generic drug: etanercept INJECT 1 PEN SUBCUTANEOUSLY EVERY 7 DAYS   folic acid 400 MCG tablet Commonly known as: FOLVITE Take 800 mcg by mouth daily.   gabapentin 300 MG capsule Commonly known as: NEURONTIN Take by mouth.   hydroxychloroquine 200 MG tablet Commonly known as: PLAQUENIL Take 200 mg by mouth 2 (two) times daily.   methotrexate 2.5 MG tablet Commonly known as: RHEUMATREX Take 1 tablet by mouth once a week. For 8 weeks   multivitamin with minerals Tabs tablet Take 1 tablet by mouth 2 (two) times daily.   ProAir HFA 108 (90 Base) MCG/ACT inhaler Generic drug: albuterol Inhale 2 puffs into the lungs every 6 (six) hours as needed for wheezing.   rOPINIRole 1 MG tablet Commonly known as: REQUIP Take 1 mg by mouth 3 (three) times daily as needed (restless legs).   tamsulosin 0.4 MG Caps capsule Commonly known as: FLOMAX Take 0.4 mg by mouth daily.   traZODone 100 MG tablet Commonly known as: DESYREL Take 1 tablet (100 mg total) by mouth at bedtime as needed for sleep. What changed: See the new instructions. Changed by: Coralyn Helling, MD   trospium 20 MG tablet Commonly known as: SANCTURA Take 20 mg by mouth 2 (two) times daily.       Signature:  Coralyn Helling, MD [facility] Pager - [phone] 06/22/2020, 3:00 PM

## 2020-06-23 ENCOUNTER — Ambulatory Visit: Payer: Medicare HMO | Admitting: Pulmonary Disease

## 2020-07-15 ENCOUNTER — Other Ambulatory Visit (HOSPITAL_COMMUNITY): Payer: Self-pay

## 2020-07-15 MED FILL — Etanercept Subcutaneous Solution Auto-injector 50 MG/ML: SUBCUTANEOUS | 28 days supply | Qty: 4 | Fill #1 | Status: AC

## 2020-07-21 ENCOUNTER — Other Ambulatory Visit (HOSPITAL_COMMUNITY): Payer: Self-pay

## 2020-08-10 ENCOUNTER — Other Ambulatory Visit (HOSPITAL_COMMUNITY): Payer: Self-pay

## 2020-08-12 ENCOUNTER — Other Ambulatory Visit (HOSPITAL_COMMUNITY): Payer: Self-pay

## 2020-08-17 ENCOUNTER — Other Ambulatory Visit (HOSPITAL_COMMUNITY): Payer: Self-pay

## 2020-08-18 ENCOUNTER — Other Ambulatory Visit (HOSPITAL_COMMUNITY): Payer: Self-pay

## 2020-08-18 MED ORDER — ETANERCEPT 50 MG/ML ~~LOC~~ SOSY
PREFILLED_SYRINGE | SUBCUTANEOUS | 2 refills | Status: DC
  Filled 2020-08-18: qty 4, 28d supply, fill #0

## 2020-08-18 MED ORDER — ENBREL SURECLICK 50 MG/ML ~~LOC~~ SOAJ
SUBCUTANEOUS | 5 refills | Status: DC
  Filled 2020-09-08: qty 4, 28d supply, fill #0
  Filled 2020-10-05: qty 4, 28d supply, fill #1
  Filled 2020-10-27: qty 4, 28d supply, fill #2
  Filled 2020-11-24: qty 4, 28d supply, fill #3
  Filled 2020-12-22: qty 4, 28d supply, fill #4
  Filled 2021-01-25: qty 4, 28d supply, fill #5

## 2020-08-25 ENCOUNTER — Other Ambulatory Visit (HOSPITAL_COMMUNITY): Payer: Self-pay

## 2020-08-26 ENCOUNTER — Other Ambulatory Visit (HOSPITAL_COMMUNITY): Payer: Self-pay

## 2020-09-08 ENCOUNTER — Other Ambulatory Visit (HOSPITAL_COMMUNITY): Payer: Self-pay

## 2020-09-14 ENCOUNTER — Other Ambulatory Visit (HOSPITAL_COMMUNITY): Payer: Self-pay

## 2020-10-05 ENCOUNTER — Other Ambulatory Visit (HOSPITAL_COMMUNITY): Payer: Self-pay

## 2020-10-12 ENCOUNTER — Other Ambulatory Visit (HOSPITAL_COMMUNITY): Payer: Self-pay

## 2020-10-27 ENCOUNTER — Other Ambulatory Visit (HOSPITAL_COMMUNITY): Payer: Self-pay

## 2020-11-02 ENCOUNTER — Other Ambulatory Visit (HOSPITAL_COMMUNITY): Payer: Self-pay

## 2020-11-24 ENCOUNTER — Other Ambulatory Visit (HOSPITAL_COMMUNITY): Payer: Self-pay

## 2020-11-30 ENCOUNTER — Other Ambulatory Visit (HOSPITAL_COMMUNITY): Payer: Self-pay

## 2020-12-04 ENCOUNTER — Other Ambulatory Visit (HOSPITAL_COMMUNITY): Payer: Self-pay

## 2020-12-07 ENCOUNTER — Other Ambulatory Visit (HOSPITAL_COMMUNITY): Payer: Self-pay

## 2020-12-22 ENCOUNTER — Other Ambulatory Visit (HOSPITAL_COMMUNITY): Payer: Self-pay

## 2020-12-28 ENCOUNTER — Other Ambulatory Visit (HOSPITAL_COMMUNITY): Payer: Self-pay

## 2021-01-18 ENCOUNTER — Other Ambulatory Visit (HOSPITAL_COMMUNITY): Payer: Self-pay

## 2021-01-25 ENCOUNTER — Other Ambulatory Visit (HOSPITAL_COMMUNITY): Payer: Self-pay

## 2021-02-08 ENCOUNTER — Other Ambulatory Visit (HOSPITAL_COMMUNITY): Payer: Self-pay

## 2021-02-08 MED ORDER — ENBREL SURECLICK 50 MG/ML ~~LOC~~ SOAJ
SUBCUTANEOUS | 5 refills | Status: DC
  Filled 2021-02-08: qty 4, 28d supply, fill #0
  Filled 2021-03-12: qty 4, 28d supply, fill #1
  Filled 2021-04-14: qty 4, 28d supply, fill #2
  Filled 2021-05-03: qty 4, 28d supply, fill #3
  Filled 2021-06-08: qty 4, 28d supply, fill #4
  Filled 2021-07-05: qty 4, 28d supply, fill #5

## 2021-02-17 ENCOUNTER — Other Ambulatory Visit (HOSPITAL_COMMUNITY): Payer: Self-pay

## 2021-03-12 ENCOUNTER — Other Ambulatory Visit (HOSPITAL_COMMUNITY): Payer: Self-pay

## 2021-03-17 ENCOUNTER — Other Ambulatory Visit (HOSPITAL_COMMUNITY): Payer: Self-pay

## 2021-04-06 ENCOUNTER — Other Ambulatory Visit (HOSPITAL_COMMUNITY): Payer: Self-pay

## 2021-04-14 ENCOUNTER — Other Ambulatory Visit (HOSPITAL_COMMUNITY): Payer: Self-pay

## 2021-04-19 ENCOUNTER — Other Ambulatory Visit (HOSPITAL_COMMUNITY): Payer: Self-pay

## 2021-05-03 ENCOUNTER — Other Ambulatory Visit (HOSPITAL_COMMUNITY): Payer: Self-pay

## 2021-05-10 ENCOUNTER — Other Ambulatory Visit (HOSPITAL_COMMUNITY): Payer: Self-pay

## 2021-06-08 ENCOUNTER — Other Ambulatory Visit (HOSPITAL_COMMUNITY): Payer: Self-pay

## 2021-06-10 ENCOUNTER — Other Ambulatory Visit (HOSPITAL_COMMUNITY): Payer: Self-pay

## 2021-06-14 ENCOUNTER — Other Ambulatory Visit (HOSPITAL_COMMUNITY): Payer: Self-pay

## 2021-07-05 ENCOUNTER — Other Ambulatory Visit (HOSPITAL_COMMUNITY): Payer: Self-pay

## 2021-07-12 ENCOUNTER — Other Ambulatory Visit (HOSPITAL_COMMUNITY): Payer: Self-pay

## 2021-07-29 ENCOUNTER — Other Ambulatory Visit (HOSPITAL_COMMUNITY): Payer: Self-pay

## 2021-07-30 ENCOUNTER — Other Ambulatory Visit (HOSPITAL_COMMUNITY): Payer: Self-pay

## 2021-08-03 ENCOUNTER — Other Ambulatory Visit (HOSPITAL_COMMUNITY): Payer: Self-pay

## 2021-08-03 MED ORDER — ENBREL SURECLICK 50 MG/ML ~~LOC~~ SOAJ
SUBCUTANEOUS | 5 refills | Status: DC
  Filled 2021-08-03: qty 4, 28d supply, fill #0
  Filled 2021-08-27: qty 4, 28d supply, fill #1
  Filled 2021-09-24: qty 4, 28d supply, fill #2
  Filled 2021-10-27: qty 4, 28d supply, fill #3
  Filled 2021-11-19: qty 4, 28d supply, fill #4
  Filled 2021-12-20: qty 4, 28d supply, fill #5

## 2021-08-04 ENCOUNTER — Other Ambulatory Visit (HOSPITAL_COMMUNITY): Payer: Self-pay

## 2021-08-24 ENCOUNTER — Other Ambulatory Visit (HOSPITAL_COMMUNITY): Payer: Self-pay

## 2021-08-26 ENCOUNTER — Other Ambulatory Visit (HOSPITAL_COMMUNITY): Payer: Self-pay

## 2021-08-27 ENCOUNTER — Other Ambulatory Visit (HOSPITAL_COMMUNITY): Payer: Self-pay

## 2021-09-02 ENCOUNTER — Other Ambulatory Visit (HOSPITAL_COMMUNITY): Payer: Self-pay

## 2021-09-24 ENCOUNTER — Other Ambulatory Visit (HOSPITAL_COMMUNITY): Payer: Self-pay

## 2021-09-30 ENCOUNTER — Other Ambulatory Visit (HOSPITAL_COMMUNITY): Payer: Self-pay

## 2021-10-26 ENCOUNTER — Other Ambulatory Visit (HOSPITAL_COMMUNITY): Payer: Self-pay

## 2021-10-27 ENCOUNTER — Other Ambulatory Visit (HOSPITAL_COMMUNITY): Payer: Self-pay

## 2021-11-01 ENCOUNTER — Other Ambulatory Visit (HOSPITAL_COMMUNITY): Payer: Self-pay

## 2021-11-19 ENCOUNTER — Other Ambulatory Visit (HOSPITAL_COMMUNITY): Payer: Self-pay

## 2021-11-30 ENCOUNTER — Other Ambulatory Visit (HOSPITAL_COMMUNITY): Payer: Self-pay

## 2021-12-20 ENCOUNTER — Other Ambulatory Visit (HOSPITAL_COMMUNITY): Payer: Self-pay

## 2021-12-27 ENCOUNTER — Other Ambulatory Visit (HOSPITAL_COMMUNITY): Payer: Self-pay

## 2022-01-12 ENCOUNTER — Other Ambulatory Visit (HOSPITAL_COMMUNITY): Payer: Self-pay

## 2022-01-14 ENCOUNTER — Other Ambulatory Visit (HOSPITAL_COMMUNITY): Payer: Self-pay

## 2022-01-17 ENCOUNTER — Other Ambulatory Visit (HOSPITAL_COMMUNITY): Payer: Self-pay

## 2022-01-17 MED ORDER — ENBREL SURECLICK 50 MG/ML ~~LOC~~ SOAJ
SUBCUTANEOUS | 5 refills | Status: DC
  Filled 2022-01-17: qty 4, 28d supply, fill #0
  Filled 2022-02-15 – 2022-02-16 (×2): qty 4, 28d supply, fill #1
  Filled 2022-03-18: qty 4, 28d supply, fill #2
  Filled 2022-04-19: qty 4, 28d supply, fill #3
  Filled 2022-05-18: qty 4, 28d supply, fill #4
  Filled 2022-06-17: qty 4, 28d supply, fill #5

## 2022-01-19 ENCOUNTER — Other Ambulatory Visit (HOSPITAL_COMMUNITY): Payer: Self-pay

## 2022-02-15 ENCOUNTER — Other Ambulatory Visit (HOSPITAL_COMMUNITY): Payer: Self-pay

## 2022-02-16 ENCOUNTER — Other Ambulatory Visit: Payer: Self-pay

## 2022-02-16 ENCOUNTER — Other Ambulatory Visit (HOSPITAL_COMMUNITY): Payer: Self-pay

## 2022-02-17 ENCOUNTER — Other Ambulatory Visit (HOSPITAL_COMMUNITY): Payer: Self-pay

## 2022-03-15 ENCOUNTER — Other Ambulatory Visit (HOSPITAL_COMMUNITY): Payer: Self-pay

## 2022-03-17 ENCOUNTER — Other Ambulatory Visit (HOSPITAL_COMMUNITY): Payer: Self-pay

## 2022-03-18 ENCOUNTER — Other Ambulatory Visit (HOSPITAL_COMMUNITY): Payer: Self-pay

## 2022-03-21 ENCOUNTER — Other Ambulatory Visit (HOSPITAL_COMMUNITY): Payer: Self-pay

## 2022-04-19 ENCOUNTER — Other Ambulatory Visit (HOSPITAL_COMMUNITY): Payer: Self-pay

## 2022-04-19 ENCOUNTER — Other Ambulatory Visit: Payer: Self-pay

## 2022-04-22 ENCOUNTER — Other Ambulatory Visit: Payer: Self-pay

## 2022-05-16 ENCOUNTER — Other Ambulatory Visit (HOSPITAL_COMMUNITY): Payer: Self-pay

## 2022-05-18 ENCOUNTER — Other Ambulatory Visit (HOSPITAL_COMMUNITY): Payer: Self-pay

## 2022-05-20 ENCOUNTER — Other Ambulatory Visit: Payer: Self-pay

## 2022-06-14 ENCOUNTER — Other Ambulatory Visit (HOSPITAL_COMMUNITY): Payer: Self-pay

## 2022-06-15 ENCOUNTER — Other Ambulatory Visit: Payer: Self-pay

## 2022-06-17 ENCOUNTER — Other Ambulatory Visit (HOSPITAL_COMMUNITY): Payer: Self-pay

## 2022-06-17 ENCOUNTER — Other Ambulatory Visit: Payer: Self-pay

## 2022-07-13 ENCOUNTER — Other Ambulatory Visit (HOSPITAL_COMMUNITY): Payer: Self-pay

## 2022-07-15 ENCOUNTER — Other Ambulatory Visit (HOSPITAL_COMMUNITY): Payer: Self-pay

## 2022-07-15 ENCOUNTER — Other Ambulatory Visit: Payer: Self-pay

## 2022-07-15 MED ORDER — ENBREL SURECLICK 50 MG/ML ~~LOC~~ SOAJ
SUBCUTANEOUS | 5 refills | Status: DC
  Filled 2022-07-15: qty 4, 28d supply, fill #0
  Filled 2022-08-10: qty 4, 28d supply, fill #1
  Filled 2022-09-09: qty 4, 28d supply, fill #2
  Filled 2022-10-04 (×2): qty 4, 28d supply, fill #3
  Filled 2022-11-01: qty 4, 28d supply, fill #4
  Filled 2022-11-24: qty 4, 28d supply, fill #5

## 2022-07-18 ENCOUNTER — Other Ambulatory Visit (HOSPITAL_COMMUNITY): Payer: Self-pay

## 2022-08-10 ENCOUNTER — Other Ambulatory Visit (HOSPITAL_COMMUNITY): Payer: Self-pay

## 2022-08-12 ENCOUNTER — Other Ambulatory Visit (HOSPITAL_COMMUNITY): Payer: Self-pay

## 2022-09-06 ENCOUNTER — Other Ambulatory Visit (HOSPITAL_COMMUNITY): Payer: Self-pay

## 2022-09-07 ENCOUNTER — Other Ambulatory Visit (HOSPITAL_COMMUNITY): Payer: Self-pay

## 2022-09-09 ENCOUNTER — Other Ambulatory Visit (HOSPITAL_COMMUNITY): Payer: Self-pay

## 2022-09-12 ENCOUNTER — Other Ambulatory Visit (HOSPITAL_COMMUNITY): Payer: Self-pay

## 2022-10-04 ENCOUNTER — Other Ambulatory Visit (HOSPITAL_COMMUNITY): Payer: Self-pay

## 2022-10-06 ENCOUNTER — Other Ambulatory Visit: Payer: Self-pay

## 2022-11-01 ENCOUNTER — Other Ambulatory Visit (HOSPITAL_COMMUNITY): Payer: Self-pay

## 2022-11-04 ENCOUNTER — Other Ambulatory Visit (HOSPITAL_COMMUNITY): Payer: Self-pay

## 2022-11-24 ENCOUNTER — Other Ambulatory Visit (HOSPITAL_COMMUNITY): Payer: Self-pay

## 2022-11-26 ENCOUNTER — Encounter (HOSPITAL_COMMUNITY): Payer: Self-pay

## 2022-12-27 ENCOUNTER — Other Ambulatory Visit (HOSPITAL_COMMUNITY): Payer: Self-pay

## 2022-12-28 ENCOUNTER — Other Ambulatory Visit: Payer: Self-pay

## 2022-12-28 ENCOUNTER — Other Ambulatory Visit (HOSPITAL_COMMUNITY): Payer: Self-pay

## 2022-12-28 MED ORDER — ENBREL SURECLICK 50 MG/ML ~~LOC~~ SOAJ
SUBCUTANEOUS | 5 refills | Status: AC
  Filled 2022-12-28: qty 4, 28d supply, fill #0
  Filled 2023-02-07: qty 4, 28d supply, fill #1
  Filled 2023-03-07: qty 4, 28d supply, fill #2
  Filled 2023-04-19: qty 4, 28d supply, fill #3
  Filled 2023-05-22: qty 4, 28d supply, fill #4
  Filled 2023-06-16: qty 4, 28d supply, fill #5

## 2022-12-28 NOTE — Progress Notes (Signed)
Specialty Pharmacy Refill Coordination Note  Anthony Friedman is a 72 y.o. male contacted today regarding refills of specialty medication(s) Etanercept   Patient requested Delivery   Delivery date: 01/05/23   Verified address: [address], [place], [address]   Medication will be filled on 01/04/23.

## 2023-01-10 ENCOUNTER — Encounter (HOSPITAL_COMMUNITY): Payer: Self-pay

## 2023-01-10 ENCOUNTER — Other Ambulatory Visit: Payer: Self-pay | Admitting: Orthopedic Surgery

## 2023-01-10 NOTE — Patient Instructions (Addendum)
SURGICAL WAITING ROOM VISITATION  Patients having surgery or a procedure may have no more than 2 support people in the waiting area - these visitors may rotate.    Children under the age of 34 must have an adult with them who is not the patient.   If the patient needs to stay at the hospital during part of their recovery, the visitor guidelines for inpatient rooms apply. Pre-op nurse will coordinate an appropriate time for 1 support person to accompany patient in pre-op.  This support person may not rotate.    Please refer to the [facility] website for the visitor guidelines for Inpatients (after your surgery is over and you are in a regular room).       Your procedure is scheduled on: 01-19-23   Report to [facility] Main Entrance    Report to admitting at     0515   AM   Call this number if you have problems the morning of surgery [phone]   Do not eat food :After Midnight.   After Midnight you may have the following liquids until _0430_____ AM/  DAY OF SURGERY  Water Non-Citrus Juices (without pulp, NO RED-Apple, White grape, White cranberry) Black Coffee (NO MILK/CREAM OR CREAMERS, sugar ok)  Clear Tea (NO MILK/CREAM OR CREAMERS, sugar ok) regular and decaf                             Plain Jell-O (NO RED)                                           Fruit ices (not with fruit pulp, NO RED)                                     Popsicles (NO RED)                                                               Sports drinks like Gatorade (NO RED)                     The day of surgery:  Drink ONE (1) Pre-Surgery  G2 BY   0430     AM the morning of surgery. Drink in one sitting. Do not sip. Then nothing by mouth This drink was given to you during your hospital  pre-op appointment visit. Nothing else to drink after completing the  Pre-Surgery  G2.          If you have questions, please contact your surgeon's office.   FOLLOW ANY ADDITIONAL PRE OP INSTRUCTIONS  YOU RECEIVED FROM YOUR SURGEON'S OFFICE!!!     Oral Hygiene is also important to reduce your risk of infection.                                    Remember - BRUSH YOUR TEETH THE MORNING OF SURGERY WITH YOUR REGULAR TOOTHPASTE  DENTURES WILL BE REMOVED PRIOR TO SURGERY  PLEASE DO NOT APPLY "Poly grip" OR ADHESIVES!!!   Do NOT smoke after Midnight   Stop all vitamins and herbal supplements 7 days before surgery.   Take these medicines the morning of surgery with A SIP OF WATER: Trospium, tamsulosin, hydroxychloroquine, gabapentin, Crestor, zyrtec  DO NOT TAKE ANY ORAL DIABETIC MEDICATIONS DAY OF YOUR SURGERY  Bring CPAP mask and tubing day of surgery.                              You may not have any metal on your body including hair pins, jewelry, and body piercing             Do not wear  lotions, powders,/cologne, or deodorant                Men may shave face and neck.   Do not bring valuables to the hospital. [facility] IS NOT             RESPONSIBLE   FOR VALUABLES.   Contacts, glasses, dentures or bridgework may not be worn into surgery.   Bring small overnight bag day of surgery.   DO NOT BRING YOUR HOME MEDICATIONS TO THE HOSPITAL. PHARMACY WILL DISPENSE MEDICATIONS LISTED ON YOUR MEDICATION LIST TO YOU DURING YOUR ADMISSION IN THE HOSPITAL!    Patients discharged on the day of surgery will not be allowed to drive home.  Someone NEEDS to stay with you for the first 24 hours after anesthesia.   Special Instructions: Bring a copy of your healthcare power of attorney and living will documents the day of surgery if you haven't scanned them before.              Please read over the following fact sheets you were given: IF YOU HAVE QUESTIONS ABOUT YOUR PRE-OP INSTRUCTIONS PLEASE CALL [phone]    If you test positive for Covid or have been in contact with anyone that has tested positive in the last 10 days please notify you surgeon.      Pre-operative 5 CHG Bath  Instructions   You can play a key role in reducing the risk of infection after surgery. Your skin needs to be as free of germs as possible. You can reduce the number of germs on your skin by washing with CHG (chlorhexidine gluconate) soap before surgery. CHG is an antiseptic soap that kills germs and continues to kill germs even after washing.   DO NOT use if you have an allergy to chlorhexidine/CHG or antibacterial soaps. If your skin becomes reddened or irritated, stop using the CHG and notify one of our RNs at [phone].   Please shower with the CHG soap starting 4 days before surgery using the following schedule:     Please keep in mind the following:  DO NOT shave, including legs and underarms, starting the day of your first shower.   You may shave your face at any point before/day of surgery.  Place clean sheets on your bed the day you start using CHG soap. Use a clean washcloth (not used since being washed) for each shower. DO NOT sleep with pets once you start using the CHG.   CHG Shower Instructions:  If you choose to wash your hair and private area, wash first with your normal shampoo/soap.  After you use shampoo/soap, rinse your hair and body thoroughly to remove shampoo/soap residue.  Turn the water OFF and apply about 3  tablespoons (45 ml) of CHG soap to a CLEAN washcloth.  Apply CHG soap ONLY FROM YOUR NECK DOWN TO YOUR TOES (washing for 3-5 minutes)  DO NOT use CHG soap on face, private areas, open wounds, or sores.  Pay special attention to the area where your surgery is being performed.  If you are having back surgery, having someone wash your back for you may be helpful. Wait 2 minutes after CHG soap is applied, then you may rinse off the CHG soap.  Pat dry with a clean towel  Put on clean clothes/pajamas   If you choose to wear lotion, please use ONLY the CHG-compatible lotions on the back of this paper.     Additional instructions for the day of surgery: DO NOT  APPLY any lotions, deodorants, cologne, or perfumes.   Put on clean/comfortable clothes.  Brush your teeth.  Ask your nurse before applying any prescription medications to the skin.   [place]- Preparing for Total Shoulder Arthroplasty    Before surgery, you can play an important role. Because skin is not sterile, your skin needs to be as free of germs as possible. You can reduce the number of germs on your skin by using the following products. Benzoyl Peroxide Gel Reduces the number of germs present on the skin Applied twice a day to shoulder area starting two days before surgery    ==================================================================  Please follow these instructions carefully:  BENZOYL PEROXIDE 5% GEL  Please do not use if you have an allergy to benzoyl peroxide.   If your skin becomes reddened/irritated stop using the benzoyl peroxide.  Starting two days before surgery, apply as follows: Apply benzoyl peroxide in the morning and at night. Apply after taking a shower. If you are not taking a shower clean entire shoulder front, back, and side along with the armpit with a clean wet washcloth.  Place a quarter-sized dollop on your shoulder and rub in thoroughly, making sure to cover the front, back, and side of your shoulder, along with the armpit.   2 days before ____ AM   ____ PM              1 day before ____ AM   ____ PM                         Do this twice a day for two days.  (Last application is the night before surgery, AFTER using the CHG soap as described below).  Do NOT apply benzoyl peroxide gel on the day of surgery.    CHG Compatible Lotions   Aveeno Moisturizing lotion  Cetaphil Moisturizing Cream  Cetaphil Moisturizing Lotion  Clairol Herbal Essence Moisturizing Lotion, Dry Skin  Clairol Herbal Essence Moisturizing Lotion, Extra Dry Skin  Clairol Herbal Essence Moisturizing Lotion, Normal Skin  Curel Age Defying Therapeutic Moisturizing  Lotion with Alpha Hydroxy  Curel Extreme Care Body Lotion  Curel Soothing Hands Moisturizing Hand Lotion  Curel Therapeutic Moisturizing Cream, Fragrance-Free  Curel Therapeutic Moisturizing Lotion, Fragrance-Free  Curel Therapeutic Moisturizing Lotion, Original Formula  Eucerin Daily Replenishing Lotion  Eucerin Dry Skin Therapy Plus Alpha Hydroxy Crme  Eucerin Dry Skin Therapy Plus Alpha Hydroxy Lotion  Eucerin Original Crme  Eucerin Original Lotion  Eucerin Plus Crme Eucerin Plus Lotion  Eucerin TriLipid Replenishing Lotion  Keri Anti-Bacterial Hand Lotion  Keri Deep Conditioning Original Lotion Dry Skin Formula Softly Scented  Keri Deep Conditioning Original Lotion, Fragrance Free Sensitive Skin Formula  Keri Lotion Fast [employer] Fragrance Free Sensitive Skin Formula  Keri Lotion Fast Absorbing Softly Scented Dry Skin Formula  Keri Original Lotion  Keri Skin Renewal Lotion Keri Silky Smooth Lotion  Keri Silky Smooth Sensitive Skin Lotion  Nivea Body Creamy Conditioning Oil  Nivea Body Extra Enriched Teacher, adult education Moisturizing Lotion Nivea Crme  Nivea Skin Firming Lotion  NutraDerm 30 Skin Lotion  NutraDerm Skin Lotion  NutraDerm Therapeutic Skin Cream  NutraDerm Therapeutic Skin Lotion  ProShield Protective Hand Cream   Incentive Spirometer  An incentive spirometer is a tool that can help keep your lungs clear and active. This tool measures how well you are filling your lungs with each breath. Taking long deep breaths may help reverse or decrease the chance of developing breathing (pulmonary) problems (especially infection) following: A long period of time when you are unable to move or be active. BEFORE THE PROCEDURE  If the spirometer includes an indicator to show your best effort, your nurse or respiratory therapist will set it to a desired goal. If possible, sit up straight or lean slightly forward. Try not to slouch. Hold  the incentive spirometer in an upright position. INSTRUCTIONS FOR USE  Sit on the edge of your bed if possible, or sit up as far as you can in bed or on a chair. Hold the incentive spirometer in an upright position. Breathe out normally. Place the mouthpiece in your mouth and seal your lips tightly around it. Breathe in slowly and as deeply as possible, raising the piston or the ball toward the top of the column. Hold your breath for 3-5 seconds or for as long as possible. Allow the piston or ball to fall to the bottom of the column. Remove the mouthpiece from your mouth and breathe out normally. Rest for a few seconds and repeat Steps 1 through 7 at least 10 times every 1-2 hours when you are awake. Take your time and take a few normal breaths between deep breaths. The spirometer may include an indicator to show your best effort. Use the indicator as a goal to work toward during each repetition. After each set of 10 deep breaths, practice coughing to be sure your lungs are clear. If you have an incision (the cut made at the time of surgery), support your incision when coughing by placing a pillow or rolled up towels firmly against it. Once you are able to get out of bed, walk around indoors and cough well. You may stop using the incentive spirometer when instructed by your caregiver.  RISKS AND COMPLICATIONS Take your time so you do not get dizzy or light-headed. If you are in pain, you may need to take or ask for pain medication before doing incentive spirometry. It is harder to take a deep breath if you are having pain. AFTER USE Rest and breathe slowly and easily. It can be helpful to keep track of a log of your progress. Your caregiver can provide you with a simple table to help with this. If you are using the spirometer at home, follow these instructions: SEEK MEDICAL CARE IF:  You are having difficultly using the spirometer. You have trouble using the spirometer as often as  instructed. Your pain medication is not giving enough relief while using the spirometer. You develop fever of 100.5 F (38.1 C) or higher. SEEK IMMEDIATE MEDICAL CARE IF:  You cough up bloody sputum that had not been present before. You develop fever  of 102 F (38.9 C) or greater. You develop worsening pain at or near the incision site. MAKE SURE YOU:  Understand these instructions. Will watch your condition. Will get help right away if you are not doing well or get worse. Document Released: 07/04/2006 Document Revised: 05/16/2011 Document Reviewed: 09/04/2006 [facility] Patient Information 2014 [place], [place].   ________________________________________________________________________

## 2023-01-10 NOTE — Progress Notes (Signed)
Please place orders in epic for preop 

## 2023-01-10 NOTE — Progress Notes (Addendum)
PCP - Herma Carson, MD  preop eval  12-15-22 Cardiologist - no  PPM/ICD -  Device Orders -  Rep Notified -   Chest x-ray - Recent CT chest   01-12-23 on Chart EKG - 12-15-22 on chart Stress Test -  ECHO - 2017  epic Cardiac Cath -   Sleep Study -  CPAP -   Fasting Blood Sugar -  Checks Blood Sugar _____ times a day  Blood Thinner Instructions: Aspirin Instructions: 81 mg asa   ERAS Protcol - PRE-SURGERY G2-    COVID vaccine -yes  Activity--Able to complete ADL's without CP or SOB Anesthesia review:DM no meds, HTN, COPD, OSA no device  Patient denies shortness of breath, fever, cough and chest pain at PAT appointment   All instructions explained to the patient, with a verbal understanding of the material. Patient agrees to go over the instructions while at home for a better understanding. Patient also instructed to self quarantine after being tested for COVID-19. The opportunity to ask questions was provided.

## 2023-01-12 ENCOUNTER — Encounter (HOSPITAL_COMMUNITY): Payer: Self-pay

## 2023-01-12 ENCOUNTER — Other Ambulatory Visit: Payer: Self-pay

## 2023-01-12 ENCOUNTER — Encounter (HOSPITAL_COMMUNITY)
Admission: RE | Admit: 2023-01-12 | Discharge: 2023-01-12 | Disposition: A | Discharge status: Home / Self Care | Payer: Medicare HMO | Source: Ambulatory Visit | Attending: Orthopedic Surgery | Admitting: Orthopedic Surgery

## 2023-01-12 VITALS — BP 127/71 | HR 74 | Temp 98.2°F | Resp 16 | Ht 69.0 in | Wt 228.0 lb

## 2023-01-12 DIAGNOSIS — E119 Type 2 diabetes mellitus without complications: Secondary | ICD-10-CM | POA: Diagnosis not present

## 2023-01-12 DIAGNOSIS — Z01818 Encounter for other preprocedural examination: Secondary | ICD-10-CM | POA: Insufficient documentation

## 2023-01-12 HISTORY — DX: Myoneural disorder, unspecified: G70.9

## 2023-01-12 HISTORY — DX: Other specified abnormal immunological findings in serum: R76.8

## 2023-01-12 HISTORY — DX: Personal history of urinary calculi: Z87.442

## 2023-01-12 LAB — BASIC METABOLIC PANEL
Anion gap: 7 (ref 5–15)
BUN: 16 mg/dL (ref 8–23)
CO2: 28 mmol/L (ref 22–32)
Calcium: 9.3 mg/dL (ref 8.9–10.3)
Chloride: 103 mmol/L (ref 98–111)
Creatinine, Ser: 0.89 mg/dL (ref 0.61–1.24)
GFR, Estimated: >60 mL/min (ref >60)
Glucose, Bld: 88 mg/dL (ref 70–99)
Potassium: 4.5 mmol/L (ref 3.5–5.1)
Sodium: 138 mmol/L (ref 135–145)

## 2023-01-12 LAB — CBC
HCT: 38.9 % — ABNORMAL LOW (ref 39.0–52.0)
Hemoglobin: 15.2 g/dL (ref 13.0–17.0)
MCH: 41.8 pg — ABNORMAL HIGH (ref 26.0–34.0)
MCHC: 39.1 g/dL — ABNORMAL HIGH (ref 30.0–36.0)
MCV: 106.9 fL — ABNORMAL HIGH (ref 80.0–100.0)
Platelets: 200 10*3/uL (ref 150–400)
RBC: 3.64 MIL/uL — ABNORMAL LOW (ref 4.22–5.81)
RDW: 17.5 % — ABNORMAL HIGH (ref 11.5–15.5)
WBC: 7.4 10*3/uL (ref 4.0–10.5)
nRBC: 0 % (ref 0.0–0.2)

## 2023-01-12 LAB — HEMOGLOBIN A1C
Hgb A1c MFr Bld: 5.1 % (ref 4.8–5.6)
Mean Plasma Glucose: 99.67 mg/dL

## 2023-01-12 LAB — GLUCOSE, CAPILLARY: Glucose-Capillary: 122 mg/dL — ABNORMAL HIGH (ref 70–99)

## 2023-01-12 LAB — SURGICAL PCR SCREEN
MRSA, PCR: NEGATIVE
Staphylococcus aureus: NEGATIVE

## 2023-01-19 ENCOUNTER — Ambulatory Visit (HOSPITAL_COMMUNITY): Admission: RE | Admit: 2023-01-19 | Payer: Medicare HMO | Source: Home / Self Care | Admitting: Orthopedic Surgery

## 2023-01-19 ENCOUNTER — Encounter (HOSPITAL_COMMUNITY): Admission: RE | Payer: Self-pay | Source: Home / Self Care

## 2023-01-19 SURGERY — ARTHROPLASTY, SHOULDER, TOTAL, REVERSE
Anesthesia: Choice | Site: Shoulder | Laterality: Right

## 2023-01-31 ENCOUNTER — Other Ambulatory Visit (HOSPITAL_COMMUNITY): Payer: Self-pay

## 2023-02-07 ENCOUNTER — Other Ambulatory Visit: Payer: Self-pay

## 2023-02-07 ENCOUNTER — Other Ambulatory Visit (HOSPITAL_COMMUNITY): Payer: Self-pay

## 2023-02-07 NOTE — Progress Notes (Signed)
Specialty Pharmacy Refill Coordination Note  Anthony Friedman is a 72 y.o. male contacted today regarding refills of specialty medication(s) Etanercept   Patient requested Delivery   Delivery date: 02/15/23   Verified address: [address], [place], [address]   Medication will be filled on 02/14/23.

## 2023-02-14 ENCOUNTER — Other Ambulatory Visit: Payer: Self-pay

## 2023-02-26 ENCOUNTER — Emergency Department (HOSPITAL_BASED_OUTPATIENT_CLINIC_OR_DEPARTMENT_OTHER)
Admission: EM | Admit: 2023-02-26 | Discharge: 2023-02-26 | Disposition: A | Discharge status: Home / Self Care | Payer: Medicare HMO | Attending: Emergency Medicine | Admitting: Emergency Medicine

## 2023-02-26 ENCOUNTER — Emergency Department (HOSPITAL_BASED_OUTPATIENT_CLINIC_OR_DEPARTMENT_OTHER): Payer: Medicare HMO

## 2023-02-26 ENCOUNTER — Encounter (HOSPITAL_BASED_OUTPATIENT_CLINIC_OR_DEPARTMENT_OTHER): Payer: Self-pay

## 2023-02-26 ENCOUNTER — Other Ambulatory Visit: Payer: Self-pay

## 2023-02-26 DIAGNOSIS — S0083XA Contusion of other part of head, initial encounter: Secondary | ICD-10-CM | POA: Diagnosis not present

## 2023-02-26 DIAGNOSIS — I10 Essential (primary) hypertension: Secondary | ICD-10-CM | POA: Insufficient documentation

## 2023-02-26 DIAGNOSIS — Z79899 Other long term (current) drug therapy: Secondary | ICD-10-CM | POA: Diagnosis not present

## 2023-02-26 DIAGNOSIS — W19XXXA Unspecified fall, initial encounter: Secondary | ICD-10-CM

## 2023-02-26 DIAGNOSIS — M5030 Other cervical disc degeneration, unspecified cervical region: Secondary | ICD-10-CM

## 2023-02-26 DIAGNOSIS — E119 Type 2 diabetes mellitus without complications: Secondary | ICD-10-CM | POA: Diagnosis not present

## 2023-02-26 DIAGNOSIS — M25511 Pain in right shoulder: Secondary | ICD-10-CM

## 2023-02-26 DIAGNOSIS — Z7982 Long term (current) use of aspirin: Secondary | ICD-10-CM | POA: Insufficient documentation

## 2023-02-26 DIAGNOSIS — J449 Chronic obstructive pulmonary disease, unspecified: Secondary | ICD-10-CM | POA: Diagnosis not present

## 2023-02-26 DIAGNOSIS — W010XXA Fall on same level from slipping, tripping and stumbling without subsequent striking against object, initial encounter: Secondary | ICD-10-CM | POA: Insufficient documentation

## 2023-02-26 MED ORDER — LIDOCAINE 5 % EX PTCH: 1.0000 | MEDICATED_PATCH | CUTANEOUS | 0 refills | Status: DC

## 2023-02-26 MED ORDER — OXYCODONE HCL 5 MG PO TABS
5.0000 mg | ORAL_TABLET | Freq: Once | ORAL | Status: AC
  Administered 2023-02-26: 5 mg via ORAL
  Filled 2023-02-26: qty 1

## 2023-02-26 MED ORDER — OXYCODONE HCL 5 MG PO TABS: 5.0000 mg | ORAL_TABLET | ORAL | 0 refills | Status: DC | PRN

## 2023-02-26 MED ORDER — CYCLOBENZAPRINE HCL 10 MG PO TABS: 10.0000 mg | ORAL_TABLET | Freq: Two times a day (BID) | ORAL | 0 refills | Status: DC | PRN

## 2023-02-26 NOTE — ED Provider Notes (Signed)
[place] [facility] Provider Note   CSN: [id number] Arrival date & time: 02/26/23  1859     History {Add pertinent medical, surgical, social history, OB history to HPI:1} Chief Complaint  Patient presents with   Anthony Friedman is a 72 y.o. male.  HPI    72 year old male with a history of COPD, diabetes, hypertension, cold agglutinin, rheumatoid arthritis on methotrexate   Past Medical History:  Diagnosis Date   Atelectasis    Bradycardia    10/2015 following bariatric surgery on atenolol --> discontinued    Chronic allergic rhinitis    Chronic venous insufficiency    superficial   Cold agglutinins present    COPD (chronic obstructive pulmonary disease) (HCC)    Cough    Diabetes mellitus without complication (HCC)    not on medications since  surgery 10/2015    Edema    lower extremities   Elevated liver enzymes    d/t arava   Family history of adverse reaction to anesthesia    pts mother had difficulty with anesthesia - pt not sure what difficulties were    History of kidney stones    Hypertension    no longer on blood pressure medications since 10/25/2015 per patient    Leg pain    Liver enlargement    d/t arava   Neuromuscular disorder (HCC)    neuropathy feet   Obesity    Pallor    Pneumonia    hx of times 3; pt states is current with pneumonia vaccine   RA (rheumatoid arthritis) (HCC)     rheumatoid arthritis    Sleep apnea    no longer uses any device after 130lbs weight loss   SOBOE (shortness of breath on exertion)    Superficial thrombophlebitis    subacute-on anticoagulation   Urinary frequency    Urinary tract bacterial infections    hx of    Venous stasis    changes     Home Medications Prior to Admission medications   Medication Sig Start Date End Date Taking? Authorizing Provider  aspirin EC 81 MG tablet Take 1 tablet (81 mg total) by mouth daily. 05/04/17   Hongalgi, Maximino Greenland, MD  Calcium  Carb-Cholecalciferol (CALCIUM 600 + D PO) Take 1 tablet by mouth 3 (three) times daily.     [provider]  cetirizine (ZYRTEC) 10 MG tablet Take 10 mg by mouth daily.    [provider]  diclofenac sodium (VOLTAREN) 1 % GEL Apply 4 g topically 4 (four) times daily as needed. 12/06/17   Hilts, Casimiro Needle, MD  DULoxetine (CYMBALTA) 30 MG capsule Take 30 mg by mouth at bedtime. 08/23/19   [provider]  DULoxetine (CYMBALTA) 60 MG capsule Take 60 mg by mouth daily in the afternoon.    [provider]  etanercept (ENBREL SURECLICK) 50 MG/ML injection INJECT 1 PEN SUBCUTANEOUSLY EVERY 7 DAYS 12/28/22     etanercept (ENBREL) 50 MG/ML injection Inject subcutaneously into the skin every 7 days 08/18/20   Lurena Nida, MD  folic acid (FOLVITE) 800 MCG tablet Take 800 mcg by mouth daily.    [provider]  gabapentin (NEURONTIN) 300 MG capsule Take 600 mg by mouth 2 (two) times daily. 04/08/19   [provider]  hydroxychloroquine (PLAQUENIL) 200 MG tablet Take 200 mg by mouth 2 (two) times daily.    [provider]  methotrexate (RHEUMATREX) 2.5 MG tablet Take 20 tablets  by mouth every Friday. 10/21/18   [provider]  Multiple Vitamins-Iron (MULTIVITAMIN/IRON PO) Take 1 tablet by mouth 2 (two) times daily.    [provider]  rOPINIRole (REQUIP) 1 MG tablet Take 1-2 mg by mouth See admin instructions. Take 2 mg in the afternoon and 1 mg at night    [provider]  rosuvastatin (CRESTOR) 5 MG tablet Take 5 mg by mouth daily.    [provider]  tamsulosin (FLOMAX) 0.4 MG CAPS capsule Take 0.4 mg by mouth daily.     [provider]  traZODone (DESYREL) 100 MG tablet Take 1 tablet (100 mg total) by mouth at bedtime as needed for sleep. 06/22/20   Coralyn Helling, MD  trospium (SANCTURA) 20 MG tablet Take 20 mg by mouth 2 (two) times daily. 04/16/18   [provider]  vitamin B-12 (CYANOCOBALAMIN)  500 MCG tablet Take 500 mcg by mouth 2 (two) times a week.    [provider]      Allergies    Atenolol and Sulfa antibiotics    Review of Systems   Review of Systems  Physical Exam Updated Vital Signs BP 121/73   Pulse (!) 57   Temp 97.8 F (36.6 C) (Oral)   Resp 18   Wt 103.4 kg   SpO2 97%   BMI 33.67 kg/m  Physical Exam  ED Results / Procedures / Treatments   Labs (all labs ordered are listed, but only abnormal results are displayed) Labs Reviewed - No data to display  EKG None  Radiology CT Head Wo Contrast Result Date: 02/26/2023 CLINICAL DATA:  PT fell today and hit his head. No LOC. Denies dizziness and headache. EXAM: CT HEAD WITHOUT CONTRAST CT CERVICAL SPINE WITHOUT CONTRAST TECHNIQUE: Multidetector CT imaging of the head and cervical spine was performed following the standard protocol without intravenous contrast. Multiplanar CT image reconstructions of the cervical spine were also generated. RADIATION DOSE REDUCTION: This exam was performed according to the departmental dose-optimization program which includes automated exposure control, adjustment of the mA and/or kV according to patient size and/or use of iterative reconstruction technique. COMPARISON:  X-ray cervical spine 07/19/2021 FINDINGS: CT HEAD FINDINGS Brain: Cerebral ventricle sizes are concordant with the degree of cerebral volume loss. Patchy and confluent areas of decreased attenuation are noted throughout the deep and periventricular white matter of the cerebral hemispheres bilaterally, compatible with chronic microvascular ischemic disease. Chronic right basal ganglia lacunar infarction. No evidence of large-territorial acute infarction. No parenchymal hemorrhage. No mass lesion. No extra-axial collection. No mass effect or midline shift. No hydrocephalus. Basilar cisterns are patent. Vascular: No hyperdense vessel. Skull: No acute fracture or focal lesion. Sinuses/Orbits: Paranasal sinuses and  mastoid air cells are clear. The orbits are unremarkable. Other: 5 mm right frontal scalp hematoma. CT CERVICAL SPINE FINDINGS Alignment: Grade 2 anterolisthesis of C3 on C4. Grade 1 anterolisthesis of C4 on C5. Mild retrolisthesis of C5 on C6. Grade 1 anterolisthesis of C7 on T1. Skull base and vertebrae: Multilevel severe degenerative changes of the spine with associated severe osseous neural foraminal stenosis at the right C3-C4, left C5-C6, and left C6-C7 levels. No severe osseous central canal stenosis peer no acute fracture. No aggressive appearing focal osseous lesion or focal pathologic process. Soft tissues and spinal canal: No prevertebral fluid or swelling. No visible canal hematoma. Upper chest: Unremarkable. Other: None. IMPRESSION: 1. No acute intracranial abnormality. 2. No acute displaced fracture or traumatic listhesis of the cervical spine. 3. Multilevel  severe degenerative changes and degenerative listhesis of the spine with associated severe osseous neural foraminal stenosis at the right C3-C4, left C5-C6, and left C6-C7 levels. Electronically Signed   By: Tish Frederickson M.D.   On: 02/26/2023 19:54   CT Cervical Spine Wo Contrast Result Date: 02/26/2023 CLINICAL DATA:  PT fell today and hit his head. No LOC. Denies dizziness and headache. EXAM: CT HEAD WITHOUT CONTRAST CT CERVICAL SPINE WITHOUT CONTRAST TECHNIQUE: Multidetector CT imaging of the head and cervical spine was performed following the standard protocol without intravenous contrast. Multiplanar CT image reconstructions of the cervical spine were also generated. RADIATION DOSE REDUCTION: This exam was performed according to the departmental dose-optimization program which includes automated exposure control, adjustment of the mA and/or kV according to patient size and/or use of iterative reconstruction technique. COMPARISON:  X-ray cervical spine 07/19/2021 FINDINGS: CT HEAD FINDINGS Brain: Cerebral ventricle sizes are concordant  with the degree of cerebral volume loss. Patchy and confluent areas of decreased attenuation are noted throughout the deep and periventricular white matter of the cerebral hemispheres bilaterally, compatible with chronic microvascular ischemic disease. Chronic right basal ganglia lacunar infarction. No evidence of large-territorial acute infarction. No parenchymal hemorrhage. No mass lesion. No extra-axial collection. No mass effect or midline shift. No hydrocephalus. Basilar cisterns are patent. Vascular: No hyperdense vessel. Skull: No acute fracture or focal lesion. Sinuses/Orbits: Paranasal sinuses and mastoid air cells are clear. The orbits are unremarkable. Other: 5 mm right frontal scalp hematoma. CT CERVICAL SPINE FINDINGS Alignment: Grade 2 anterolisthesis of C3 on C4. Grade 1 anterolisthesis of C4 on C5. Mild retrolisthesis of C5 on C6. Grade 1 anterolisthesis of C7 on T1. Skull base and vertebrae: Multilevel severe degenerative changes of the spine with associated severe osseous neural foraminal stenosis at the right C3-C4, left C5-C6, and left C6-C7 levels. No severe osseous central canal stenosis peer no acute fracture. No aggressive appearing focal osseous lesion or focal pathologic process. Soft tissues and spinal canal: No prevertebral fluid or swelling. No visible canal hematoma. Upper chest: Unremarkable. Other: None. IMPRESSION: 1. No acute intracranial abnormality. 2. No acute displaced fracture or traumatic listhesis of the cervical spine. 3. Multilevel severe degenerative changes and degenerative listhesis of the spine with associated severe osseous neural foraminal stenosis at the right C3-C4, left C5-C6, and left C6-C7 levels. Electronically Signed   By: Tish Frederickson M.D.   On: 02/26/2023 19:54   DG Shoulder Right Result Date: 02/26/2023 CLINICAL DATA:  Status post fall. EXAM: RIGHT SHOULDER - 2+ VIEW COMPARISON:  December 01, 2017 FINDINGS: There is no evidence of an acute fracture  or dislocation. A chronic fifth right rib fracture is present. A chronic fracture deformity is again seen along the greater tubercle of the right humeral head. Moderate to marked severity degenerative changes are noted involving the right acromioclavicular joint and right glenohumeral articulation. Soft tissues are unremarkable. IMPRESSION: 1. Chronic fracture deformity of the greater tubercle of the right humeral head. 2. Moderate to marked severity degenerative changes. Electronically Signed   By: Aram Candela M.D.   On: 02/26/2023 19:52    Procedures Procedures  {Document cardiac monitor, telemetry assessment procedure when appropriate:1}  Medications Ordered in ED Medications - No data to display  ED Course/ Medical Decision Making/ A&P   {   Click here for ABCD2, HEART and other calculatorsREFRESH Note before signing :1}  Medical Decision Making Amount and/or Complexity of Data Reviewed Radiology: ordered.   ***  X-ray of the right shoulder obtained and personally about interpreted by me and radiology shows chronic fracture deformity of the greater tubercle of the right humeral head, degenerative changes, no acute fracture.  CT head completed and shows no evidence of acute intracranial abnormalities, fracture CT cervical spine shows multilevel degenerative changes without acute fracture {Document critical care time when appropriate:1} {Document review of labs and clinical decision tools ie heart score, Chads2Vasc2 etc:1}  {Document your independent review of radiology images, and any outside records:1} {Document your discussion with family members, caretakers, and with consultants:1} {Document social determinants of health affecting pt's care:1} {Document your decision making why or why not admission, treatments were needed:1} Final Clinical Impression(s) / ED Diagnoses Final diagnoses:  None    Rx / DC Orders ED Discharge Orders     None

## 2023-02-26 NOTE — ED Triage Notes (Addendum)
Pt arrives after having a fall today. Pt was walking and tripped and fell on his right side hitting his right shoulder and head. Pt does take aspirin. Per pt, it is difficult for him to raise his arm. Pt does have chronic neck pain, but denies new neck pain at this time.

## 2023-03-06 ENCOUNTER — Other Ambulatory Visit: Payer: Self-pay | Admitting: Orthopedic Surgery

## 2023-03-07 ENCOUNTER — Other Ambulatory Visit: Payer: Self-pay

## 2023-03-07 NOTE — Progress Notes (Signed)
 Sent message, via epic in basket, requesting orders in epic from Careers adviser.

## 2023-03-07 NOTE — Progress Notes (Signed)
 Specialty Pharmacy Refill Coordination Note  Anthony Friedman is a 72 y.o. male contacted today regarding refills of specialty medication(s) Etanercept  (Enbrel  SureClick)   Patient requested Delivery   Delivery date: 03/10/23   Verified address: [address]  [place]  [address]   Medication will be filled on 03/09/23.

## 2023-03-09 ENCOUNTER — Other Ambulatory Visit: Payer: Self-pay

## 2023-03-09 ENCOUNTER — Encounter (HOSPITAL_COMMUNITY): Payer: Self-pay

## 2023-03-09 ENCOUNTER — Other Ambulatory Visit (HOSPITAL_COMMUNITY): Payer: Self-pay

## 2023-03-09 NOTE — Progress Notes (Signed)
 Pharmacy Patient Advocate Encounter   Received notification from Patient Pharmacy that prior authorization for Enbrel  is required/requested.   Insurance verification completed.   The patient is insured through [employer] .   Per test claim: PA required; PA submitted to above mentioned insurance via CoverMyMeds Key/confirmation #/EOC STARLYN Status is pending

## 2023-03-09 NOTE — Progress Notes (Signed)
 Pharmacy Patient Advocate Encounter  Received notification from [employer] that Prior Authorization for Enbrel has been APPROVED from 03/08/23 to 03/06/24   PA #/Case ID/Reference #:  [id number]

## 2023-03-09 NOTE — Progress Notes (Signed)
 Copay is $2,000, but he is eligible for payment plan with medicare. Patient is calling insurance to enroll.

## 2023-03-13 ENCOUNTER — Other Ambulatory Visit: Payer: Self-pay

## 2023-03-14 ENCOUNTER — Other Ambulatory Visit: Payer: Self-pay

## 2023-03-14 MED ORDER — CHLORHEXIDINE GLUCONATE 0.12 % MT SOLN: 15.0000 mL | Freq: Once | OROMUCOSAL | Status: DC

## 2023-03-14 MED ORDER — ORAL CARE MOUTH RINSE: 15.0000 mL | Freq: Once | OROMUCOSAL | Status: DC

## 2023-03-14 NOTE — Patient Instructions (Addendum)
 SURGICAL WAITING ROOM VISITATION Patients having surgery or a procedure may have no more than 2 support people in the waiting area - these visitors may rotate in the visitor waiting room.   Due to an increase in RSV and influenza rates and associated hospitalizations, children ages 33 and under may not visit patients in [facility] hospitals. If the patient needs to stay at the hospital during part of their recovery, the visitor guidelines for inpatient rooms apply.  PRE-OP VISITATION  Pre-op nurse will coordinate an appropriate time for 1 support person to accompany the patient in pre-op.  This support person may not rotate.  This visitor will be contacted when the time is appropriate for the visitor to come back in the pre-op area.  Please refer to the [facility] website for the visitor guidelines for Inpatients (after your surgery is over and you are in a regular room).  You are not required to quarantine at this time prior to your surgery. However, you must do this: Hand Hygiene often Do NOT share personal items Notify your provider if you are in close contact with someone who has COVID or you develop fever 100.4 or greater, new onset of sneezing, cough, sore throat, shortness of breath or body aches.  If you test positive for Covid or have been in contact with anyone that has tested positive in the last 10 days please notify you surgeon.    Your procedure is scheduled on:  Thursday   March 23, 2023  Report to [facility] Main Entrance: [address] entrance where the [employer] is available.   Report to admitting at:  05:15   AM  Call this number if you have any questions or problems the morning of surgery [phone]  Do not eat food after Midnight the night prior to your surgery/procedure.  After Midnight you may have the following liquids until  04:30 AM DAY OF SURGERY  Clear Liquid Diet Water  Black Coffee (sugar ok, NO MILK/CREAM OR CREAMERS)  Tea (sugar ok, NO  MILK/CREAM OR CREAMERS) regular and decaf                             Plain Jell-O  with no fruit (NO RED)                                           Fruit ices (not with fruit pulp, NO RED)                                     Popsicles (NO RED)                                                                  Juice: NO CITRUS JUICES: only apple, WHITE grape, WHITE cranberry Sports drinks like Gatorade or Powerade (NO RED)                   The day of surgery:  Drink ONE (1) Pre-Surgery Clear Ensure at   04:30  AM the morning of surgery. Drink in one sitting. Do not sip.  This drink was given to you during your hospital pre-op appointment visit. Nothing else to drink after completing the Pre-Surgery Clear Ensure : No candy, chewing gum or throat lozenges.    FOLLOW ANY ADDITIONAL PRE OP INSTRUCTIONS YOU RECEIVED FROM YOUR SURGEON'S OFFICE!!!   Oral Hygiene is also important to reduce your risk of infection.        Remember - BRUSH YOUR TEETH THE MORNING OF SURGERY WITH YOUR REGULAR TOOTHPASTE  Do NOT smoke after Midnight the night before surgery.  STOP TAKING Aspirin  - already stopped 03-14-23  STOP TAKING all Vitamins, Herbs and supplements 1 week before your  surgery.   Take ONLY these medicines the morning of surgery with A SIP OF WATER : tamsulosin  , trospium.   You may not have any metal on your body including  jewelry, and body piercing  Do not wear  lotions, powders, cologne, or deodorant  Men may shave face and neck.  Contacts, Hearing Aids, dentures or bridgework may not be worn into surgery. DENTURES WILL BE REMOVED PRIOR TO SURGERY PLEASE DO NOT APPLY Poly grip OR ADHESIVES!!!  Patients discharged on the day of surgery will not be allowed to drive home.  Someone NEEDS to stay with you for the first 24 hours after anesthesia.  Do not bring your home medications to the hospital. The Pharmacy will dispense medications listed on your medication list to you during your  admission in the Hospital.  Please read over the following fact sheets you were given: IF YOU HAVE QUESTIONS ABOUT YOUR PRE-OP INSTRUCTIONS, PLEASE CALL [phone].     Pre-operative 5 CHG Bath Instructions   You can play a key role in reducing the risk of infection after surgery. Your skin needs to be as free of germs as possible. You can reduce the number of germs on your skin by washing with CHG (chlorhexidine  gluconate) soap before surgery. CHG is an antiseptic soap that kills germs and continues to kill germs even after washing.   DO NOT use if you have an allergy to chlorhexidine /CHG or antibacterial soaps. If your skin becomes reddened or irritated, stop using the CHG and notify one of our RNs at [phone]  Please shower with the CHG soap starting 4 days before surgery using the following schedule: START SHOWERS ON   SUNDAY   March 19, 2023                                                                                                                                                                              Please keep in mind the following:  DO NOT shave, including legs  and underarms, starting the day of your first shower.   You may shave your face at any point before/day of surgery.   Place clean sheets on your bed the day you start using CHG soap. Use a clean washcloth (not used since being washed) for each shower. DO NOT sleep with pets once you start using the CHG.   CHG Shower Instructions:  If you choose to wash your hair and private area, wash first with your normal shampoo/soap.  After you use shampoo/soap, rinse your hair and body thoroughly to remove shampoo/soap residue.  Turn the water  OFF and apply about 3 tablespoons (45 ml) of CHG soap to a CLEAN washcloth.  Apply CHG soap ONLY FROM YOUR NECK DOWN TO YOUR TOES (washing for 3-5 minutes)  DO NOT use CHG soap on face, private areas, open wounds, or sores.  Pay special attention to the area where your  surgery is being performed.  If you are having back surgery, having someone wash your back for you may be helpful.  Wait 2 minutes after CHG soap is applied, then you may rinse off the CHG soap.  Pat dry with a clean towel  Put on clean clothes/pajamas   If you choose to wear lotion, please use ONLY the CHG-compatible lotions on the back of this paper.     Additional instructions for the day of surgery: DO NOT APPLY any lotions, deodorants, cologne, or perfumes.   Put on clean/comfortable clothes.  Brush your teeth.  Ask your nurse before applying any prescription medications to the skin.      CHG Compatible Lotions   Aveeno Moisturizing lotion  Cetaphil Moisturizing Cream  Cetaphil Moisturizing Lotion  Clairol Herbal Essence Moisturizing Lotion, Dry Skin  Clairol Herbal Essence Moisturizing Lotion, Extra Dry Skin  Clairol Herbal Essence Moisturizing Lotion, Normal Skin  Curel Age Defying Therapeutic Moisturizing Lotion with Alpha Hydroxy  Curel Extreme Care Body Lotion  Curel Soothing Hands Moisturizing Hand Lotion  Curel Therapeutic Moisturizing Cream, Fragrance-Free  Curel Therapeutic Moisturizing Lotion, Fragrance-Free  Curel Therapeutic Moisturizing Lotion, Original Formula  Eucerin Daily Replenishing Lotion  Eucerin Dry Skin Therapy Plus Alpha Hydroxy Crme  Eucerin Dry Skin Therapy Plus Alpha Hydroxy Lotion  Eucerin Original Crme  Eucerin Original Lotion  Eucerin Plus Crme Eucerin Plus Lotion  Eucerin TriLipid Replenishing Lotion  Keri Anti-Bacterial Hand Lotion  Keri Deep Conditioning Original Lotion Dry Skin Formula Softly Scented  Keri Deep Conditioning Original Lotion, Fragrance Free Sensitive Skin Formula  Keri Lotion Fast Absorbing Fragrance Free Sensitive Skin Formula  Keri Lotion Fast Absorbing Softly Scented Dry Skin Formula  Keri Original Lotion  Keri Skin Renewal Lotion Keri Silky Smooth Lotion  Keri Silky Smooth Sensitive Skin Lotion  Nivea Body  Creamy Conditioning Oil  Nivea Body Extra Enriched Lotion  Nivea Body Original Lotion  Nivea Body Sheer Moisturizing Lotion Nivea Crme  Nivea Skin Firming Lotion  NutraDerm 30 Skin Lotion  NutraDerm Skin Lotion  NutraDerm Therapeutic Skin Cream  NutraDerm Therapeutic Skin Lotion  ProShield Protective Hand Cream  Provon moisturizing lotion    Preparing for Total Shoulder Arthroplasty ================================================================= Please follow these instructions carefully, in addition to any other special Bathing information that was explained to you at the Presurgical Appointment:  BENZOYL PEROXIDE 5% GEL: Used to kill bacteria on the skin which could cause an infection at the surgery site.   Please do not use if you have an allergy to benzoyl peroxide. If your skin becomes reddened/irritated stop using the benzoyl  peroxide and inform your Doctor.   Starting two days before surgery, apply as follows:  1. Apply benzoyl peroxide gel in the morning and at night. Apply after taking a shower. If you are not taking a shower, clean entire shoulder front, back, and side, along with the armpit with a clean wet washcloth.  2. Place a quarter-sized dollop of the gel on your SHOULDER and rub in thoroughly, making sure to cover the front, back, and side of your shoulder, along with the armpit.   2 Days prior to Surgery   TUESDAY  March 21, 2023 First Application _______ Morning Second Application _______ Night  Day Before Surgery     [facility]  March 22, 2023 First Application______ Morning  On the night before surgery, wash your entire body (except hair, face and private areas) with CHG Soap. THEN, rub in the LAST application of the Benzoyl Peroxide Gel on your shoulder.   3. On the Morning of Surgery wash your BODY AGAIN with CHG Soap (except hair, face and private areas)  4. DO NOT USE THE BENZOYL PEROXIDE GEL ON THE DAY OF YOUR SURGERY       FAILURE TO  FOLLOW THESE INSTRUCTIONS MAY RESULT IN THE CANCELLATION OF YOUR SURGERY  PATIENT SIGNATURE_________________________________  NURSE SIGNATURE__________________________________  ________________________________________________________________________

## 2023-03-14 NOTE — Progress Notes (Addendum)
 COVID Vaccine received:  []  No [x]  Yes Date of any COVID positive Test in last 90 days:  none  PCP - Ike Ditty, MD at [facility] in [place], [phone] Cardiologist - none Hematology- Zachary Slocumb MD at [facility]  Rheumatologist- Dr. Lynwood Ramsay   Chest x-ray - 05-06-2021  2v  CEW EKG -  05-01-2017    MD order for repeat at PST Stress Test -  ECHO -02-06-2023  CEW,  [facility] -   PCR screen: [x]  Ordered & Completed []   No Order but Needs PROFEND     []   N/A for this surgery  Surgery Plan:  [x]  Ambulatory   []  Outpatient in bed  []  Admit Anesthesia:    []  General  []  Spinal  [x]   Choice []   MAC  Pacemaker / ICD device [x]  No []  Yes   Spinal Cord Stimulator:[]  No [x]  Yes  BRING REMOTE     History of Sleep Apnea? []  No [x]  Yes   CPAP used?- [x]  No []  Yes    Does the patient monitor blood sugar?   [x]  N/A   []  No []  Yes  Patient has: [x]  NO Hx DM   []  Pre-DM   []  DM1  []   DM2 Last A1c was: normal 5.1 on  01-12-2023     Blood Thinner / Instructions:  none Aspirin  Instructions:  ASA 81mg   Stopped on 03-14-23  ERAS Protocol Ordered: []  No  [x]  Yes PRE-SURGERY [x]  ENSURE  []  G2   Patient is to be NPO after: 0430  Dental hx: []  Dentures:  [x]  N/A      [x]  Bridge or Partial: has 2 partials 1 up and 1 on the bottom                  [x]  Loose or Damaged teeth:   Comments: Patient was given the 5 CHG shower / bath instructions for Reverse Shoulder arthroplasty surgery along with 2 bottles of the CHG soap. Patient will start this on:  03-19-2023   All questions were asked and answered, Patient voiced understanding of this process.    The patient was given Benzoyl peroxide Gel as ordered. Instruction regarding application starting 2 days prior to surgery was given and patient voiced understanding.   Activity level: Patient is unable to climb a flight of stairs without difficulty; [x]  No CP  [x]  No SOB, but would have back and leg pain.  Patient can perform ADLs without  assistance.   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     Patient denies shortness of breath, fever, cough and chest pain at PAT appointment.  Patient verbalized understanding and agreement to the Pre-Surgical Instructions that were given to them at this PAT appointment. Patient was also educated of the need to review these PAT instructions again prior to his surgery.I reviewed the appropriate phone numbers to call if they have any and questions or concerns.

## 2023-03-15 ENCOUNTER — Encounter (HOSPITAL_COMMUNITY): Payer: Self-pay

## 2023-03-15 ENCOUNTER — Encounter (HOSPITAL_COMMUNITY)
Admission: RE | Admit: 2023-03-15 | Discharge: 2023-03-15 | Disposition: A | Discharge status: Home / Self Care | Payer: Medicare HMO | Source: Ambulatory Visit | Attending: Orthopedic Surgery | Admitting: Orthopedic Surgery

## 2023-03-15 ENCOUNTER — Ambulatory Visit (HOSPITAL_COMMUNITY)
Admission: RE | Admit: 2023-03-15 | Discharge: 2023-03-15 | Disposition: A | Discharge status: Home / Self Care | Payer: Medicare HMO | Source: Ambulatory Visit | Attending: Orthopedic Surgery | Admitting: Orthopedic Surgery

## 2023-03-15 ENCOUNTER — Other Ambulatory Visit: Payer: Self-pay

## 2023-03-15 VITALS — BP 132/70 | HR 60 | Temp 98.0°F | Resp 16 | Ht 69.0 in | Wt 234.0 lb

## 2023-03-15 DIAGNOSIS — M069 Rheumatoid arthritis, unspecified: Secondary | ICD-10-CM | POA: Diagnosis not present

## 2023-03-15 DIAGNOSIS — Z79899 Other long term (current) drug therapy: Secondary | ICD-10-CM | POA: Insufficient documentation

## 2023-03-15 DIAGNOSIS — Z01818 Encounter for other preprocedural examination: Secondary | ICD-10-CM | POA: Insufficient documentation

## 2023-03-15 DIAGNOSIS — I10 Essential (primary) hypertension: Secondary | ICD-10-CM | POA: Insufficient documentation

## 2023-03-15 DIAGNOSIS — E119 Type 2 diabetes mellitus without complications: Secondary | ICD-10-CM | POA: Insufficient documentation

## 2023-03-15 HISTORY — DX: Disease of blood and blood-forming organs, unspecified: D75.9

## 2023-03-15 LAB — COMPREHENSIVE METABOLIC PANEL
ALT: 26 U/L (ref 0–44)
AST: 32 U/L (ref 15–41)
Albumin: 3.6 g/dL (ref 3.5–5.0)
Alkaline Phosphatase: 69 U/L (ref 38–126)
Anion gap: 7 (ref 5–15)
BUN: 16 mg/dL (ref 8–23)
CO2: 30 mmol/L (ref 22–32)
Calcium: 9 mg/dL (ref 8.9–10.3)
Chloride: 102 mmol/L (ref 98–111)
Creatinine, Ser: 0.86 mg/dL (ref 0.61–1.24)
GFR, Estimated: >60 mL/min (ref >60)
Glucose, Bld: 133 mg/dL — ABNORMAL HIGH (ref 70–99)
Potassium: 4.6 mmol/L (ref 3.5–5.1)
Sodium: 139 mmol/L (ref 135–145)
Total Bilirubin: 0.8 mg/dL (ref 0.0–1.2)
Total Protein: 6.6 g/dL (ref 6.5–8.1)

## 2023-03-15 LAB — CBC
HCT: 37.8 % — ABNORMAL LOW (ref 39.0–52.0)
Hemoglobin: 14 g/dL (ref 13.0–17.0)
MCH: 38.6 pg — ABNORMAL HIGH (ref 26.0–34.0)
MCHC: 37 g/dL — ABNORMAL HIGH (ref 30.0–36.0)
MCV: 104.1 fL — ABNORMAL HIGH (ref 80.0–100.0)
Platelets: 235 10*3/uL (ref 150–400)
RBC: 3.63 MIL/uL — ABNORMAL LOW (ref 4.22–5.81)
RDW: 14.6 % (ref 11.5–15.5)
WBC: 6.4 10*3/uL (ref 4.0–10.5)
nRBC: 0 % (ref 0.0–0.2)

## 2023-03-15 LAB — SURGICAL PCR SCREEN
MRSA, PCR: NEGATIVE
Staphylococcus aureus: NEGATIVE

## 2023-03-22 ENCOUNTER — Encounter (HOSPITAL_COMMUNITY): Payer: Self-pay | Admitting: Orthopedic Surgery

## 2023-03-22 NOTE — Anesthesia Preprocedure Evaluation (Addendum)
Anesthesia Evaluation  Patient identified by MRN, date of birth, ID band Patient awake    Reviewed: Allergy & Precautions, NPO status , Patient's Chart, lab work & pertinent test results  Airway Mallampati: II  TM Distance: >3 FB Neck ROM: Full    Dental no notable dental hx. (+) Dental Advisory Given, Missing, Poor Dentition   Pulmonary COPD, former smoker   Pulmonary exam normal breath sounds clear to auscultation       Cardiovascular hypertension, Normal cardiovascular exam Rhythm:Regular Rate:Normal     Neuro/Psych   Anxiety      Neuromuscular disease    GI/Hepatic Neg liver ROS,,,  Endo/Other    Renal/GU Lab Results      Component                Value               Date                           K                        4.6                 03/15/2023                    CREATININE               0.86                03/15/2023                GFRNONAA                 >60                 03/15/2023                  Musculoskeletal  (+) Arthritis , Rheumatoid disorders,    Abdominal   Peds  Hematology Lab Results      Component                Value               Date                      WBC                      6.4                 03/15/2023                HGB                      14.0                03/15/2023                HCT                      37.8 (L)            03/15/2023                MCV                      104.1 (  H)           03/15/2023                PLT                      235                 03/15/2023              Anesthesia Other Findings All: Atenolol, Sulfa  Reproductive/Obstetrics                             Anesthesia Physical Anesthesia Plan  ASA: 3  Anesthesia Plan: General   Post-op Pain Management: Regional block* and Minimal or no pain anticipated   Induction: Intravenous  PONV Risk Score and Plan: 3 and Treatment may vary due to age or medical  condition, Ondansetron and Midazolam  Airway Management Planned: Video Laryngoscope Planned  Additional Equipment: None  Intra-op Plan:   Post-operative Plan: Extubation in OR  Informed Consent: I have reviewed the patients History and Physical, chart, labs and discussed the procedure including the risks, benefits and alternatives for the proposed anesthesia with the patient or authorized representative who has indicated his/her understanding and acceptance.     Dental advisory given  Plan Discussed with: CRNA and Anesthesiologist  Anesthesia Plan Comments: (GA + R ISB)        Anesthesia Quick Evaluation

## 2023-03-23 ENCOUNTER — Encounter (HOSPITAL_COMMUNITY)
Admission: RE | Disposition: A | Discharge status: Home / Self Care | Payer: Self-pay | Source: Home / Self Care | Attending: Orthopedic Surgery

## 2023-03-23 ENCOUNTER — Ambulatory Visit (HOSPITAL_COMMUNITY)
Admission: RE | Admit: 2023-03-23 | Discharge: 2023-03-23 | Disposition: A | Discharge status: Home / Self Care | Payer: Medicare HMO | Attending: Orthopedic Surgery | Admitting: Orthopedic Surgery

## 2023-03-23 ENCOUNTER — Encounter (HOSPITAL_COMMUNITY): Payer: Self-pay | Admitting: Orthopedic Surgery

## 2023-03-23 ENCOUNTER — Other Ambulatory Visit: Payer: Self-pay

## 2023-03-23 ENCOUNTER — Ambulatory Visit (HOSPITAL_COMMUNITY): Payer: Self-pay | Admitting: Certified Registered Nurse Anesthetist

## 2023-03-23 DIAGNOSIS — J449 Chronic obstructive pulmonary disease, unspecified: Secondary | ICD-10-CM | POA: Diagnosis not present

## 2023-03-23 DIAGNOSIS — F419 Anxiety disorder, unspecified: Secondary | ICD-10-CM | POA: Diagnosis not present

## 2023-03-23 DIAGNOSIS — M25711 Osteophyte, right shoulder: Secondary | ICD-10-CM | POA: Insufficient documentation

## 2023-03-23 DIAGNOSIS — M19011 Primary osteoarthritis, right shoulder: Secondary | ICD-10-CM | POA: Insufficient documentation

## 2023-03-23 DIAGNOSIS — I10 Essential (primary) hypertension: Secondary | ICD-10-CM

## 2023-03-23 DIAGNOSIS — M75101 Unspecified rotator cuff tear or rupture of right shoulder, not specified as traumatic: Secondary | ICD-10-CM | POA: Insufficient documentation

## 2023-03-23 HISTORY — PX: REVERSE SHOULDER ARTHROPLASTY: SHX5054

## 2023-03-23 SURGERY — ARTHROPLASTY, SHOULDER, TOTAL, REVERSE
Anesthesia: General | Site: Shoulder | Laterality: Right

## 2023-03-23 MED ORDER — PHENYLEPHRINE HCL-NACL 20-0.9 MG/250ML-% IV SOLN
INTRAVENOUS | Status: DC | PRN
  Administered 2023-03-23: 50 ug/min via INTRAVENOUS

## 2023-03-23 MED ORDER — FENTANYL CITRATE (PF) 100 MCG/2ML IJ SOLN
INTRAMUSCULAR | Status: DC | PRN
  Administered 2023-03-23 (×2): 25 ug via INTRAVENOUS
  Administered 2023-03-23: 50 ug via INTRAVENOUS

## 2023-03-23 MED ORDER — ONDANSETRON HCL 4 MG/2ML IJ SOLN
INTRAMUSCULAR | Status: DC | PRN
  Administered 2023-03-23: 4 mg via INTRAVENOUS

## 2023-03-23 MED ORDER — METHOCARBAMOL 500 MG PO TABS: 500.0000 mg | ORAL_TABLET | Freq: Four times a day (QID) | ORAL | Status: DC | PRN

## 2023-03-23 MED ORDER — TRANEXAMIC ACID-NACL 1000-0.7 MG/100ML-% IV SOLN
1000.0000 mg | INTRAVENOUS | Status: AC
  Administered 2023-03-23: 1000 mg via INTRAVENOUS
  Filled 2023-03-23: qty 100

## 2023-03-23 MED ORDER — PHENYLEPHRINE HCL-NACL 20-0.9 MG/250ML-% IV SOLN
INTRAVENOUS | Status: AC
  Filled 2023-03-23: qty 500

## 2023-03-23 MED ORDER — DEXAMETHASONE SODIUM PHOSPHATE 10 MG/ML IJ SOLN
INTRAMUSCULAR | Status: DC | PRN
  Administered 2023-03-23: 8 mg via INTRAVENOUS

## 2023-03-23 MED ORDER — ONDANSETRON HCL 4 MG/2ML IJ SOLN
INTRAMUSCULAR | Status: AC
  Filled 2023-03-23: qty 2

## 2023-03-23 MED ORDER — LACTATED RINGERS IV SOLN: INTRAVENOUS | Status: DC

## 2023-03-23 MED ORDER — PROPOFOL 10 MG/ML IV BOLUS
INTRAVENOUS | Status: DC | PRN
  Administered 2023-03-23: 150 mg via INTRAVENOUS
  Administered 2023-03-23 (×2): 10 mg via INTRAVENOUS

## 2023-03-23 MED ORDER — ROCURONIUM BROMIDE 10 MG/ML (PF) SYRINGE
PREFILLED_SYRINGE | INTRAVENOUS | Status: AC
  Filled 2023-03-23: qty 10

## 2023-03-23 MED ORDER — BUPIVACAINE HCL (PF) 0.5 % IJ SOLN
INTRAMUSCULAR | Status: DC | PRN
  Administered 2023-03-23: 15 mL via PERINEURAL

## 2023-03-23 MED ORDER — BUPIVACAINE LIPOSOME 1.3 % IJ SUSP
INTRAMUSCULAR | Status: DC | PRN
  Administered 2023-03-23: 10 mL via PERINEURAL

## 2023-03-23 MED ORDER — FENTANYL CITRATE (PF) 100 MCG/2ML IJ SOLN
INTRAMUSCULAR | Status: AC
  Filled 2023-03-23: qty 2

## 2023-03-23 MED ORDER — 0.9 % SODIUM CHLORIDE (POUR BTL) OPTIME
TOPICAL | Status: DC | PRN
  Administered 2023-03-23: 1000 mL

## 2023-03-23 MED ORDER — OXYCODONE HCL 5 MG PO TABS: 5.0000 mg | ORAL_TABLET | ORAL | 0 refills | Status: DC | PRN

## 2023-03-23 MED ORDER — SUGAMMADEX SODIUM 200 MG/2ML IV SOLN
INTRAVENOUS | Status: DC | PRN
  Administered 2023-03-23: 200 mg via INTRAVENOUS

## 2023-03-23 MED ORDER — MIDAZOLAM HCL 2 MG/2ML IJ SOLN
INTRAMUSCULAR | Status: AC
  Filled 2023-03-23: qty 2

## 2023-03-23 MED ORDER — MIDAZOLAM HCL 5 MG/5ML IJ SOLN
INTRAMUSCULAR | Status: DC | PRN
  Administered 2023-03-23: 1 mg via INTRAVENOUS

## 2023-03-23 MED ORDER — CHLORHEXIDINE GLUCONATE 0.12 % MT SOLN
15.0000 mL | Freq: Once | OROMUCOSAL | Status: AC
  Administered 2023-03-23: 15 mL via OROMUCOSAL

## 2023-03-23 MED ORDER — PROPOFOL 10 MG/ML IV BOLUS
INTRAVENOUS | Status: AC
Start: 2023-03-23 — End: ?
  Filled 2023-03-23: qty 20

## 2023-03-23 MED ORDER — ACETAMINOPHEN 10 MG/ML IV SOLN
INTRAVENOUS | Status: AC
  Filled 2023-03-23: qty 100

## 2023-03-23 MED ORDER — LIDOCAINE HCL (PF) 2 % IJ SOLN
INTRAMUSCULAR | Status: AC
  Filled 2023-03-23: qty 5

## 2023-03-23 MED ORDER — PROPOFOL 10 MG/ML IV BOLUS
INTRAVENOUS | Status: AC
  Filled 2023-03-23: qty 20

## 2023-03-23 MED ORDER — LIDOCAINE HCL (CARDIAC) PF 100 MG/5ML IV SOSY
PREFILLED_SYRINGE | INTRAVENOUS | Status: DC | PRN
  Administered 2023-03-23: 100 mg via INTRAVENOUS

## 2023-03-23 MED ORDER — ORAL CARE MOUTH RINSE: 15.0000 mL | Freq: Once | OROMUCOSAL | Status: AC

## 2023-03-23 MED ORDER — ONDANSETRON HCL 4 MG/2ML IJ SOLN: 4.0000 mg | Freq: Four times a day (QID) | INTRAMUSCULAR | Status: DC | PRN

## 2023-03-23 MED ORDER — METHOCARBAMOL 1000 MG/10ML IJ SOLN: 500.0000 mg | Freq: Four times a day (QID) | INTRAMUSCULAR | Status: DC | PRN

## 2023-03-23 MED ORDER — CEFAZOLIN SODIUM-DEXTROSE 2-4 GM/100ML-% IV SOLN
2.0000 g | INTRAVENOUS | Status: AC
  Administered 2023-03-23: 2 g via INTRAVENOUS
  Filled 2023-03-23: qty 100

## 2023-03-23 MED ORDER — FENTANYL CITRATE PF 50 MCG/ML IJ SOSY
PREFILLED_SYRINGE | INTRAMUSCULAR | Status: AC
  Administered 2023-03-23: 25 ug via INTRAVENOUS
  Filled 2023-03-23: qty 2

## 2023-03-23 MED ORDER — ONDANSETRON HCL 4 MG/2ML IJ SOLN: 4.0000 mg | Freq: Once | INTRAMUSCULAR | Status: DC | PRN

## 2023-03-23 MED ORDER — ROCURONIUM BROMIDE 100 MG/10ML IV SOLN
INTRAVENOUS | Status: DC | PRN
  Administered 2023-03-23: 50 mg via INTRAVENOUS
  Administered 2023-03-23: 10 mg via INTRAVENOUS

## 2023-03-23 MED ORDER — DEXAMETHASONE SODIUM PHOSPHATE 10 MG/ML IJ SOLN
INTRAMUSCULAR | Status: AC
  Filled 2023-03-23: qty 1

## 2023-03-23 MED ORDER — DEXMEDETOMIDINE HCL IN NACL 80 MCG/20ML IV SOLN
INTRAVENOUS | Status: AC
  Filled 2023-03-23: qty 20

## 2023-03-23 MED ORDER — ACETAMINOPHEN 10 MG/ML IV SOLN
1000.0000 mg | Freq: Once | INTRAVENOUS | Status: DC | PRN
  Administered 2023-03-23: 1000 mg via INTRAVENOUS

## 2023-03-23 MED ORDER — WATER FOR IRRIGATION, STERILE IR SOLN
Status: DC | PRN
  Administered 2023-03-23: 1000 mL

## 2023-03-23 MED ORDER — ONDANSETRON HCL 4 MG PO TABS: 4.0000 mg | ORAL_TABLET | Freq: Four times a day (QID) | ORAL | Status: DC | PRN

## 2023-03-23 MED ORDER — DEXMEDETOMIDINE HCL IN NACL 80 MCG/20ML IV SOLN
INTRAVENOUS | Status: DC | PRN
  Administered 2023-03-23 (×3): 4 ug via INTRAVENOUS

## 2023-03-23 MED ORDER — SODIUM CHLORIDE 0.9 % IR SOLN
Status: DC | PRN
  Administered 2023-03-23: 1000 mL

## 2023-03-23 MED ORDER — FENTANYL CITRATE PF 50 MCG/ML IJ SOSY
25.0000 ug | PREFILLED_SYRINGE | INTRAMUSCULAR | Status: DC | PRN
  Administered 2023-03-23: 50 ug via INTRAVENOUS
  Administered 2023-03-23: 25 ug via INTRAVENOUS

## 2023-03-23 SURGICAL SUPPLY — 71 items
BAG COUNTER SPONGE SURGICOUNT (BAG) IMPLANT
BAG ZIPLOCK 12X15 (MISCELLANEOUS) ×1 IMPLANT
BASEPLATE P2 COATD GLND 6.5X30 (Shoulder) IMPLANT
BIT DRILL 1.6MX128 (BIT) IMPLANT
BIT DRILL 2.5 DIA 127 CALI (BIT) IMPLANT
BIT DRILL 4 DIA CALIBRATED (BIT) IMPLANT
BLADE SAW SGTL 73X25 THK (BLADE) ×1 IMPLANT
BOOTIES KNEE HIGH SLOAN (MISCELLANEOUS) ×2 IMPLANT
COOLER ICEMAN CLASSIC (MISCELLANEOUS) ×1 IMPLANT
COVER BACK TABLE 60X90IN (DRAPES) ×1 IMPLANT
COVER SURGICAL LIGHT HANDLE (MISCELLANEOUS) ×1 IMPLANT
DRAPE INCISE IOBAN 66X45 STRL (DRAPES) ×1 IMPLANT
DRAPE POUCH INSTRU U-SHP 10X18 (DRAPES) ×1 IMPLANT
DRAPE SHEET LG 3/4 BI-LAMINATE (DRAPES) ×1 IMPLANT
DRAPE SURG 17X11 SM STRL (DRAPES) ×1 IMPLANT
DRAPE SURG ORHT 6 SPLT 77X108 (DRAPES) ×2 IMPLANT
DRAPE TOP 10253 STERILE (DRAPES) ×1 IMPLANT
DRAPE U-SHAPE 47X51 STRL (DRAPES) ×1 IMPLANT
DRSG AQUACEL AG ADV 3.5X 6 (GAUZE/BANDAGES/DRESSINGS) ×1 IMPLANT
DURAPREP 26ML APPLICATOR (WOUND CARE) ×2 IMPLANT
ELECT BLADE TIP CTD 4 INCH (ELECTRODE) ×1 IMPLANT
ELECT REM PT RETURN 15FT ADLT (MISCELLANEOUS) ×1 IMPLANT
FACESHIELD WRAPAROUND (MASK) ×1
FACESHIELD WRAPAROUND OR TEAM (MASK) ×1 IMPLANT
GLOVE BIO SURGEON STRL SZ7.5 (GLOVE) ×1 IMPLANT
GLOVE BIOGEL PI IND STRL 6.5 (GLOVE) ×1 IMPLANT
GLOVE BIOGEL PI IND STRL 8 (GLOVE) ×1 IMPLANT
GLOVE SURG SS PI 6.5 STRL IVOR (GLOVE) ×1 IMPLANT
GOWN STRL REUS W/ TWL LRG LVL3 (GOWN DISPOSABLE) ×1 IMPLANT
GOWN STRL REUS W/ TWL XL LVL3 (GOWN DISPOSABLE) ×1 IMPLANT
HEAD GLENOID W/SCREW 32MM (Shoulder) IMPLANT
HOOD PEEL AWAY T7 (MISCELLANEOUS) ×3 IMPLANT
INSERT EPOLY STND HUMERUS 36MM (Shoulder) ×1 IMPLANT
INSERT EPOLYSTD HUMERUS 36MM (Shoulder) IMPLANT
KIT BASIN OR (CUSTOM PROCEDURE TRAY) ×1 IMPLANT
KIT TURNOVER KIT A (KITS) IMPLANT
MANIFOLD NEPTUNE II (INSTRUMENTS) ×1 IMPLANT
NDL TROCAR POINT SZ 2 1/2 (NEEDLE) IMPLANT
NEEDLE TROCAR POINT SZ 2 1/2 (NEEDLE)
NS IRRIG 1000ML POUR BTL (IV SOLUTION) ×1 IMPLANT
P2 COATDE GLNOID BSEPLT 6.5X30 (Shoulder) ×1 IMPLANT
PACK SHOULDER (CUSTOM PROCEDURE TRAY) ×1 IMPLANT
PAD COLD SHLDR WRAP-ON (PAD) ×1 IMPLANT
PROTECTOR NERVE ULNAR (MISCELLANEOUS) IMPLANT
RESTRAINT HEAD UNIVERSAL NS (MISCELLANEOUS) ×1 IMPLANT
RETRIEVER SUT HEWSON (MISCELLANEOUS) IMPLANT
SCREW BONE LOCKING RSP 5.0X14 (Screw) ×1 IMPLANT
SCREW BONE LOCKING RSP 5.0X30 (Screw) ×1 IMPLANT
SCREW BONE RSP LOCK 5X14 (Screw) IMPLANT
SCREW BONE RSP LOCK 5X18 (Screw) IMPLANT
SCREW BONE RSP LOCK 5X22 (Screw) IMPLANT
SCREW BONE RSP LOCK 5X30 (Screw) IMPLANT
SCREW BONE RSP LOCKING 18MM LG (Screw) ×1 IMPLANT
SCREW BONE RSP LOCKING 5.0X32 (Screw) ×1 IMPLANT
SET HNDPC FAN SPRY TIP SCT (DISPOSABLE) ×1 IMPLANT
SLING ARM IMMOBILIZER LRG (SOFTGOODS) IMPLANT
SLING ARM IMMOBILIZER MED (SOFTGOODS) IMPLANT
STEM HUMERAL STD SHELL 10X48 (Stem) IMPLANT
STRIP CLOSURE SKIN 1/2X4 (GAUZE/BANDAGES/DRESSINGS) ×1 IMPLANT
SUCTION TUBE FRAZIER 10FR DISP (SUCTIONS) IMPLANT
SUPPORT WRAP ARM LG (MISCELLANEOUS) ×1 IMPLANT
SUT ETHIBOND 2 V 37 (SUTURE) IMPLANT
SUT FIBERWIRE #2 38 REV NDL BL (SUTURE)
SUT MNCRL AB 4-0 PS2 18 (SUTURE) ×1 IMPLANT
SUT VIC AB 2-0 CT1 TAPERPNT 27 (SUTURE) ×2 IMPLANT
SUTURE FIBERWR#2 38 REV NDL BL (SUTURE) IMPLANT
TAP SURG THRD DJ 6.5 (ORTHOPEDIC DISPOSABLE SUPPLIES) IMPLANT
TAPE LABRALWHITE 1.5X36 (TAPE) IMPLANT
TAPE SUT LABRALTAP WHT/BLK (SUTURE) IMPLANT
TOWEL OR 17X26 10 PK STRL BLUE (TOWEL DISPOSABLE) ×1 IMPLANT
WATER STERILE IRR 1000ML POUR (IV SOLUTION) ×1 IMPLANT

## 2023-03-23 NOTE — H&P (Signed)
Anthony Friedman is an 73 y.o. male.   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  Pain interferes with sleep and quality of life.   Past Medical History:  Diagnosis Date   Atelectasis    Blood dyscrasia    cold agglutinin d/t RA   Bradycardia    10/2015 following bariatric surgery on atenolol --> discontinued    Chronic allergic rhinitis    Chronic venous insufficiency    superficial   Cold agglutinins present    COPD (chronic obstructive pulmonary disease) (HCC)    Cough    Edema    lower extremities   Elevated liver enzymes    d/t arava   Family history of adverse reaction to anesthesia    pts mother had difficulty with anesthesia - pt not sure what difficulties were    History of kidney stones    Hypertension    no longer on blood pressure medications since 10/25/2015 per patient    Leg pain    Liver enlargement    d/t arava   Neuromuscular disorder (HCC)    neuropathy feet   Obesity    Pallor    Pneumonia    hx of times 3; pt states is current with pneumonia vaccine   RA (rheumatoid arthritis) (HCC)     rheumatoid arthritis    Sleep apnea    no longer uses any device after 130lbs weight loss   SOBOE (shortness of breath on exertion)    Superficial thrombophlebitis    subacute-on anticoagulation   Urinary frequency    Urinary tract bacterial infections    hx of    Venous stasis    changes    Past Surgical History:  Procedure Laterality Date   HAND SURGERY  1965   right hand   I & D EXTREMITY Left 05/03/2017   Procedure: IRRIGATION AND DEBRIDEMENT EXTREMITY;  Surgeon: Roby Lofts, MD;  Location: [facility] OR;  Service: Orthopedics;  Laterality: Left;   I & D KNEE WITH POLY EXCHANGE Left 11/04/2016   Procedure: LEFT KNEE POLY EXCHANGE;  Surgeon: Kathryne Hitch, MD;  Location: [facility];  Service: Orthopedics;  Laterality: Left;   KNEE SURGERY      arthroscopic right and left knee   LAMINECTOMY  1988   and fusion; L4, L5, S1   LAMINECTOMY  2007   and fusion L1, L2, L3   LAPAROSCOPIC GASTRIC SLEEVE RESECTION N/A 10/26/2015   Procedure: LAPAROSCOPIC GASTRIC SLEEVE RESECTION, UPPER ENDO;  Surgeon: Ovidio Kin, MD;  Location: [facility];  Service: General;  Laterality: N/A;   OTHER SURGICAL HISTORY     polynidal cyst   REPLACEMENT TOTAL KNEE  2009   left 2009; right 2014   SPINAL CORD STIMULATOR IMPLANT     to control back pain    TOTAL HIP ARTHROPLASTY Right 08/08/2014   Procedure: RIGHT TOTAL HIP ARTHROPLASTY ANTERIOR APPROACH;  Surgeon: Kathryne Hitch, MD;  Location: [facility];  Service: Orthopedics;  Laterality: Right;   TOTAL HIP ARTHROPLASTY Left 12/11/2015   Procedure: LEFT TOTAL HIP ARTHROPLASTY ANTERIOR APPROACH;  Surgeon: Kathryne Hitch, MD;  Location: [facility];  Service: Orthopedics;  Laterality: Left;   UMBILICAL HERNIA REPAIR      Family History  Problem Relation Age of Onset   Heart attack Father    Esophageal cancer Mother    Other Mother        Tachycardia  Diabetes Brother    Social History:  reports that he quit smoking about 5 years ago. His smoking use included cigarettes. He started smoking about 50 years ago. He has a 90 pack-year smoking history. He has never used smokeless tobacco. He reports that he does not drink alcohol and does not use drugs.  Allergies:  Allergies  Allergen Reactions   Atenolol Other (See Comments)    Lowers heart rate   Sulfa Antibiotics Other (See Comments)    Blood in urine     Medications Prior to Admission  Medication Sig Dispense Refill   aspirin EC 81 MG tablet Take 1 tablet (81 mg total) by mouth daily.     Calcium Carb-Cholecalciferol (CALCIUM 600 + D PO) Take 1 tablet by mouth 3 (three) times daily.      cetirizine (ZYRTEC) 10 MG tablet Take 10 mg by mouth daily.     cyclobenzaprine (FLEXERIL) 10 MG tablet Take 1 tablet (10 mg total) by mouth 2 (two) times  daily as needed for muscle spasms. 20 tablet 0   diclofenac sodium (VOLTAREN) 1 % GEL Apply 4 g topically 4 (four) times daily as needed. 500 g 6   DULoxetine (CYMBALTA) 30 MG capsule Take 30 mg by mouth at bedtime.     DULoxetine (CYMBALTA) 60 MG capsule Take 60 mg by mouth daily in the afternoon.     etanercept (ENBREL SURECLICK) 50 MG/ML injection INJECT 1 PEN SUBCUTANEOUSLY EVERY 7 DAYS 4 mL 5   folic acid (FOLVITE) 800 MCG tablet Take 800 mcg by mouth daily.     gabapentin (NEURONTIN) 300 MG capsule Take 600 mg by mouth 2 (two) times daily.     hydroxychloroquine (PLAQUENIL) 200 MG tablet Take 200 mg by mouth 2 (two) times daily.     methotrexate (RHEUMATREX) 2.5 MG tablet Take 20 tablets by mouth every Friday.     Multiple Vitamins-Iron (MULTIVITAMIN/IRON PO) Take 1 tablet by mouth 2 (two) times daily.     rOPINIRole (REQUIP) 1 MG tablet Take 1-2 mg by mouth See admin instructions. Take 2 mg in the afternoon and 1 mg at night     rosuvastatin (CRESTOR) 5 MG tablet Take 5 mg by mouth daily.     tamsulosin (FLOMAX) 0.4 MG CAPS capsule Take 0.4 mg by mouth daily.      traZODone (DESYREL) 100 MG tablet Take 1 tablet (100 mg total) by mouth at bedtime as needed for sleep. 90 tablet 1   trospium (SANCTURA) 20 MG tablet Take 20 mg by mouth 2 (two) times daily.     vitamin B-12 (CYANOCOBALAMIN) 500 MCG tablet Take 500 mcg by mouth 2 (two) times a week.     etanercept (ENBREL) 50 MG/ML injection Inject subcutaneously into the skin every 7 days (Patient not taking: Reported on 03/14/2023) 4 mL 2   Menthol, Topical Analgesic, (BIOFREEZE EX) Apply 1 Application topically daily as needed (pain).      No results found for this or any previous visit (from the past 48 hours). No results found.  Review of Systems  All other systems reviewed and are negative.   Blood pressure 138/85, pulse 71, temperature 97.6 F (36.4 C), temperature source Oral, resp. rate 14, height 5\' 9"  (1.753 m), weight 106.1 kg,  SpO2 94%. Physical Exam Constitutional:      Appearance: He is well-developed.  HENT:     Head: Atraumatic.  Eyes:     Extraocular Movements: Extraocular movements intact.  Cardiovascular:  Pulses: Normal pulses.  Pulmonary:     Effort: Pulmonary effort is normal.  Musculoskeletal:     Comments: R shoulder pain with limited ROM. NVID  Skin:    General: Skin is warm and dry.  Neurological:     Mental Status: He is alert and oriented to person, place, and time.  Psychiatric:        Mood and Affect: Mood normal.      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

## 2023-03-23 NOTE — Discharge Instructions (Addendum)
Discharge Instructions after Reverse Total Shoulder Arthroplasty   A sling has been provided for you. You are to wear this at all times (except for bathing and dressing), until your first post operative visit with Dr. Ave Filter. Please also wear while sleeping at night. While you bath and dress, let the arm/elbow extend straight down to stretch your elbow. Wiggle your fingers and pump your first while your in the sling to prevent hand swelling. Use ice on the shoulder intermittently over the first 48 hours after surgery. Continue to use ice or and ice machine as needed after 48 hours for pain control/swelling.  Pain medicine has been prescribed for you.  Use your medicine liberally over the first 48 hours, and then you can begin to taper your use. You may take Extra Strength Tylenol or Tylenol only in place of the pain pills. DO NOT take ANY nonsteroidal anti-inflammatory pain medications: Advil, Motrin, Ibuprofen, Aleve, Naproxen or Naprosyn.  Resume aspirin the day after surgery  Leave your dressing on until your first follow up visit.  You may shower with the dressing.  Hold your arm as if you still have your sling on while you shower. Simply allow the water to wash over the site and then pat dry. Make sure your axilla (armpit) is completely dry after showering.    Please call [phone] during normal business hours or [phone] after hours for any problems. Including the following:  - excessive redness of the incisions - drainage for more than 4 days - fever of more than 101.5 F  *Please note that pain medications will not be refilled after hours or on weekends.    Dental Antibiotics:  In most cases prophylactic antibiotics for Dental procdeures after total joint surgery are not necessary.  Exceptions are as follows:  1. History of prior total joint infection  2. Severely immunocompromised (Organ Transplant, cancer chemotherapy, Rheumatoid biologic meds such as Humera)  3.  Poorly controlled diabetes (A1C &gt; 8.0, blood glucose over 200)  If you have one of these conditions, contact your surgeon for an antibiotic prescription, prior to your dental procedure.

## 2023-03-23 NOTE — Evaluation (Signed)
Occupational Therapy Evaluation Patient Details Name: Anthony Friedman MRN: [id number] DOB: [birth year] Today's Date: 03/23/2023   History of Present Illness Pt is a 73 y.o. male s/p R reverse shoulder arthroplasty on 03/23/23. PMH significant for bilateral hip arthroplasty, laminectomy, bilateral total knee replacements, COPD, HTN, RA, obesity, and neuropathy.   Clinical Impression   PTA pt lives with spouse and was independent in ADLs, mobility with [facility] and one fall in December.  Education completed regarding compensatory strategies for ADL tasks and functional mobility, management of sling, R ROM per specified parameters in the order set as indicated below, positioning of operative arm in sitting and supine and edema control, including use of "Iceman" Cold Therapy machine. Caregiver present for education, written handouts provided and reviewed using Teach Back and pt/caregiver verbalized/demonstrated understanding. Due to the below listed deficits, pt requires mod assistance with ADL tasks and CGA assist with functional mobility. Caregiver will be able to provide necessary level of assistance at discharge. Pt to follow up with MD to progress rehab of the operative shoulder.        If plan is discharge home, recommend the following: A little help with walking and/or transfers;A little help with bathing/dressing/bathroom;Assist for transportation;Help with stairs or ramp for entrance;Assistance with cooking/housework    Functional Status Assessment  Patient has had a recent decline in their functional status and demonstrates the ability to make significant improvements in function in a reasonable and predictable amount of time.  Equipment Recommendations  None recommended by OT    Recommendations for Other Services       Precautions / Restrictions Precautions Precautions: Shoulder;Fall Type of Shoulder Precautions: NWB, sling on at all times except for ADLs + exercises, AROM ok to hand, wrist and  ebow Shoulder Interventions: Shoulder sling/immobilizer;Off for dressing/bathing/exercises Restrictions Weight Bearing Restrictions Per Provider Order: Yes RUE Weight Bearing Per Provider Order: Non weight bearing      Mobility Bed Mobility                    Transfers Overall transfer level: Needs assistance Equipment used: None Transfers: Sit to/from Stand Sit to Stand: Contact guard assist                  Balance Overall balance assessment: Mild deficits observed, not formally tested, History of Falls                                         ADL either performed or assessed with clinical judgement   ADL Overall ADL's : Needs assistance/impaired Eating/Feeding: Independent;Sitting   Grooming: Minimal assistance;Adhering to UE precautions;Sitting   Upper Body Bathing: Adhering to UE precautions;Sitting;Moderate assistance   Lower Body Bathing: Moderate assistance;Sit to/from stand;Cueing for sequencing;Cueing for safety   Upper Body Dressing : Moderate assistance;Sitting;Cueing for safety;Adhering to UE precautions   Lower Body Dressing: Moderate assistance;Sit to/from stand;Cueing for sequencing;Cueing for safety Lower Body Dressing Details (indicate cue type and reason): dons pants and shoes recliner level Toilet Transfer: Minimal assistance;Regular Toilet;Ambulation   Toileting- Clothing Manipulation and Hygiene: Moderate assistance;Cueing for safety;Cueing for sequencing;Sit to/from stand       Functional mobility during ADLs: Cane;Contact guard assist General ADL Comments: adhering to RUE shoulder precautions      Pertinent Vitals/Pain Pain Assessment Pain Assessment: 0-10 Pain Score: 0-No pain     Extremity/Trunk Assessment Upper Extremity Assessment Upper Extremity Assessment: Right  hand dominant;RUE deficits/detail RUE: Unable to fully assess due to immobilization   Lower Extremity Assessment Lower Extremity  Assessment: Overall WFL for tasks assessed       Communication Communication Communication: No apparent difficulties   Cognition Arousal: Alert Behavior During Therapy: WFL for tasks assessed/performed Overall Cognitive Status: Within Functional Limits for tasks assessed                                       General Comments   While moving within specified parameters, pt/caregiver instructed on bathing and how to donn/doff shirt, placing operative arm through sleeve first when donning and off last when doffing.Pt/caregiver educated on compensatory strategies for LB ADL and strategies to reduce risk of falls.  Pt/caregiver educated on donning/doffing sling and to wear the sling at all times with the exception of ADL, and to loosen the neck strap of the sling when the operative arm is in a supported position when sitting. In sitting or supine, pt instructed to have a pillow behind and under their operative arm to provide support. If assist needed with ambulation, caregiver educated on the importance of walking on pt's non-operative side.  Education regarding use of "IceMan" Cold Therapy completed, including the importance of using a barrier on the shoulder prior to positioning the wrap-on pad. Pt/caregiver verbalized/demonstrated understanding. Teach Back used while caregiver assisted with dressing pt and positioning "wrap-on pad" to facilitate DC.     Exercises Exercises: Shoulder   Shoulder Instructions Shoulder Instructions Donning/doffing shirt without moving shoulder: Moderate assistance Method for sponge bathing under operated UE: Moderate assistance Donning/doffing sling/immobilizer: Moderate assistance Correct positioning of sling/immobilizer: Moderate assistance ROM for elbow, wrist and digits of operated UE: Moderate assistance Sling wearing schedule (on at all times/off for ADL's): Supervision/safety Proper positioning of operated UE when showering:  Supervision/safety Positioning of UE while sleeping: Supervision/safety    Home Living Family/patient expects to be discharged to:: Private residence Living Arrangements: Spouse/significant other Available Help at Discharge: Family;Available 24 hours/day Type of Home: House Home Access: Level entry     Home Layout: One level     Bathroom Shower/Tub: Chief Strategy Officer: Standard     Home Equipment: Cane - single point          Prior Functioning/Environment Prior Level of Function : Independent/Modified Independent;Driving;History of Falls (last six months)             Mobility Comments: SPC ADLs Comments: independent +driving        OT Problem List: Decreased range of motion;Decreased activity tolerance;Impaired balance (sitting and/or standing);Pain;Impaired UE functional use;Decreased knowledge of precautions;Decreased knowledge of use of DME or AE       AM-PAC OT "6 Clicks" Daily Activity     Outcome Measure Help from another person eating meals?: A Little Help from another person taking care of personal grooming?: A Little Help from another person toileting, which includes using toliet, bedpan, or urinal?: A Lot Help from another person bathing (including washing, rinsing, drying)?: A Lot Help from another person to put on and taking off regular upper body clothing?: A Lot Help from another person to put on and taking off regular lower body clothing?: A Lot 6 Click Score: 14   End of Session Equipment Utilized During Treatment: Other (comment) (shoulder sling) Nurse Communication: Other (comment);Precautions;Weight bearing status (ADL + shld education complete)  Activity Tolerance: Patient tolerated treatment  well Patient left: in chair;with family/visitor present  OT Visit Diagnosis: Other abnormalities of gait and mobility (R26.89)                Time: 8413-2440 OT Time Calculation (min): 40 min Charges:  OT General Charges $OT Visit:  1 Visit OT Evaluation $OT Eval Low Complexity: 1 Low OT Treatments $Self Care/Home Management : 23-37 mins  Margurete Guaman L. Montey Ebel, OTR/L  03/23/23, 10:56 AM

## 2023-03-23 NOTE — Anesthesia Procedure Notes (Signed)
Procedure Name: Intubation Date/Time: 03/23/2023 7:34 AM  Performed by: Cleda Clarks, CRNAPre-anesthesia Checklist: Patient identified, Emergency Drugs available, Suction available and Patient being monitored Patient Re-evaluated:Patient Re-evaluated prior to induction Oxygen Delivery Method: Circle system utilized Preoxygenation: Pre-oxygenation with 100% oxygen Induction Type: IV induction Ventilation: Mask ventilation without difficulty Laryngoscope Size: Glidescope and 4 Tube type: Oral Tube size: 7.0 mm Number of attempts: 1 Airway Equipment and Method: Stylet and Video-laryngoscopy Placement Confirmation: ETT inserted through vocal cords under direct vision, positive ETCO2 and breath sounds checked- equal and bilateral Secured at: 22 cm Tube secured with: Tape Dental Injury: Teeth and Oropharynx as per pre-operative assessment

## 2023-03-23 NOTE — Op Note (Signed)
Procedure(s): REVERSE SHOULDER ARTHROPLASTY Procedure Note  Anthony Friedman male 73 y.o. 03/23/2023  Preoperative diagnosis: Right shoulder rotator cuff tear arthropathy  Postoperative diagnosis: Same  Procedure(s) and Anesthesia Type:    * REVERSE SHOULDER ARTHROPLASTY - Choice   Indications:  73 y.o. 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 Pain and dysfunction interfered with quality of life and nonoperative treatment with activity modification, NSAIDS and injections failed.     Surgeon: Glennon Hamilton   Assistants: Fredia Sorrow PA-C Amber was present and scrubbed throughout the procedure and was essential in positioning, retraction, exposure, and closure)  Anesthesia: General endotracheal anesthesia with preoperative interscalene block given by the attending anesthesiologist    Procedure Detail  REVERSE SHOULDER ARTHROPLASTY   Estimated Blood Loss:  200 mL         Drains: none  Blood Given: none          Specimens: none        Complications:  * No complications entered in OR log *         Disposition: PACU - hemodynamically stable.         Condition: stable      OPERATIVE FINDINGS:  A DJO Altivate pressfit reverse total shoulder arthroplasty was placed with a  size 10 stem, a 36 standard glenosphere, and a standard-mm poly insert. The base plate  fixation was excellent.  PROCEDURE: The patient was identified in the preoperative holding area  where I personally marked the operative site after verifying site, side,  and procedure with the patient. An interscalene block given by  the attending anesthesiologist in the holding area and the patient was taken back to the operating room where all extremities were  carefully padded in position after general anesthesia was induced. She  was placed in a beach-chair position and the operative upper extremity was  prepped and draped in a standard sterile fashion. An  approximately 10-  cm incision was made from the tip of the coracoid process to the center  point of the humerus at the level of the axilla. Dissection was carried  down through subcutaneous tissues to the level of the cephalic vein  which was taken laterally with the deltoid. The pectoralis major was  retracted medially. The subdeltoid space was developed and the lateral  edge of the conjoined tendon was identified. The undersurface of  conjoined tendon was palpated and the musculocutaneous nerve was not in  the field. Retractor was placed underneath the conjoined and second  retractor was placed lateral into the deltoid. The circumflex humeral  artery and vessels were identified and clamped and coagulated. The  biceps tendon was severely degenerated and tenotomized.  The subscapularis was severely degenerated and taken down as a peel.  The  joint was then gently externally rotated while the capsule was released  from the humeral neck around to just beyond the 6 o'clock position. At  this point, the joint was dislocated and the humeral head was presented  into the wound. The excessive osteophyte formation was removed with a  large rongeur.  The cutting guide was used to make the appropriate  head cut and the head was saved for potentially bone grafting.  The glenoid was exposed with the arm in an  abducted extended position. The anterior and posterior labrum were  completely excised and the capsule was released circumferentially to  allow for exposure of the glenoid for preparation. The 2.5 mm drill was  placed  using the guide in 5-10 inferior angulation and the tap was then advanced in the same hole. Small and large reamers were then used. The tap was then removed and the Metaglene was then screwed in with excellent purchase.  The peripheral guide was then used to drilled measured and filled peripheral locking screws. The size 36 standard glenosphere was then impacted on the [facility] taper and  the central screw was placed. The humerus was then again exposed and the diaphyseal reamers were used followed by the metaphyseal reamers. The final broach was left in place in the proximal trial was placed. The joint was reduced and with this implant it was felt that soft tissue tensioning was appropriate with excellent stability and excellent range of motion. Therefore, final humeral stem was placed press-fit.  And then the trial polyethylene inserts were tested again and the above implant was felt to be the most appropriate for final insertion. The joint was reduced taken through full range of motion and felt to be stable. Soft tissue tension was appropriate.  The joint was then copiously irrigated with pulse  lavage and the wound was then closed. The subscapularis was not repaired.  Skin was closed with 2-0 Vicryl in a deep dermal layer and 4-0  Monocryl for skin closure. Steri-Strips were applied. Sterile  dressings were then applied as well as a sling. The patient was allowed  to awaken from general anesthesia, transferred to stretcher, and taken  to recovery room in stable condition.   POSTOPERATIVE PLAN: The patient will be observed in the recovery room and if pain is well-controlled with regional anesthesia and he is hemodynamically stable he can be discharged home today with family.

## 2023-03-23 NOTE — Anesthesia Procedure Notes (Signed)
Anesthesia Regional Block: Interscalene brachial plexus block   Pre-Anesthetic Checklist: , timeout performed,  Correct Patient, Correct Site, Correct Laterality,  Correct Procedure, Correct Position, site marked,  Risks and benefits discussed,  Surgical consent,  Pre-op evaluation,  At surgeon's request and post-op pain management  Laterality: Upper and Right  Prep: Maximum Sterile Barrier Precautions used, chloraprep       Needles:  Injection technique: Single-shot  Needle Type: Echogenic Needle     Needle Length: 5cm  Needle Gauge: 21     Additional Needles:   Procedures:,,,, ultrasound used (permanent image in chart),,    Narrative:  Start time: 03/23/2023 7:13 AM End time: 03/23/2023 7:20 AM Injection made incrementally with aspirations every 5 mL.  Performed by: Personally  Anesthesiologist: Trevor Iha, MD  Additional Notes: Block assessed prior to procedure. Patient tolerated procedure well.

## 2023-03-23 NOTE — Anesthesia Postprocedure Evaluation (Signed)
Anesthesia Post Note  Patient: Anthony Friedman  Procedure(s) Performed: REVERSE SHOULDER ARTHROPLASTY (Right: Shoulder)     Patient location during evaluation: PACU Anesthesia Type: General and Regional Level of consciousness: awake and alert Pain management: pain level controlled Vital Signs Assessment: post-procedure vital signs reviewed and stable Respiratory status: spontaneous breathing, nonlabored ventilation, respiratory function stable and patient connected to nasal cannula oxygen Cardiovascular status: blood pressure returned to baseline and stable Postop Assessment: no apparent nausea or vomiting Anesthetic complications: no   No notable events documented.  Last Vitals:  Vitals:   03/23/23 1000 03/23/23 1048  BP: (!) 140/74 121/76  Pulse: 71 64  Resp: 18   Temp:  36.4 C  SpO2: 94% 94%    Last Pain:  Vitals:   03/23/23 1048  TempSrc:   PainSc: 0-No pain                 Trevor Iha

## 2023-03-23 NOTE — Transfer of Care (Signed)
Immediate Anesthesia Transfer of Care Note  Patient: Anthony Friedman  Procedure(s) Performed: REVERSE SHOULDER ARTHROPLASTY (Right: Shoulder)  Patient Location: PACU  Anesthesia Type:General  Level of Consciousness: awake, alert , and oriented  Airway & Oxygen Therapy: Patient Spontanous Breathing and Patient connected to face mask oxygen  Post-op Assessment: Report given to RN and Post -op Vital signs reviewed and stable  Post vital signs: Reviewed and stable  Last Vitals:  Vitals Value Taken Time  BP 144/92 03/23/23 0851  Temp    Pulse 84 03/23/23 0853  Resp 18 03/23/23 0853  SpO2 92 % 03/23/23 0853  Vitals shown include unfiled device data.  Last Pain:  Vitals:   03/23/23 0617  TempSrc: Oral  PainSc:          Complications: No notable events documented.

## 2023-03-24 ENCOUNTER — Encounter (HOSPITAL_COMMUNITY): Payer: Self-pay | Admitting: Orthopedic Surgery

## 2023-03-29 ENCOUNTER — Other Ambulatory Visit (HOSPITAL_COMMUNITY): Payer: Self-pay

## 2023-03-31 ENCOUNTER — Other Ambulatory Visit: Payer: Self-pay

## 2023-04-03 ENCOUNTER — Other Ambulatory Visit: Payer: Self-pay

## 2023-04-03 ENCOUNTER — Other Ambulatory Visit (HOSPITAL_COMMUNITY): Payer: Self-pay

## 2023-04-03 ENCOUNTER — Encounter (HOSPITAL_COMMUNITY): Payer: Self-pay

## 2023-04-19 ENCOUNTER — Other Ambulatory Visit: Payer: Self-pay

## 2023-04-19 ENCOUNTER — Encounter (HOSPITAL_COMMUNITY): Payer: Self-pay

## 2023-04-19 NOTE — Progress Notes (Signed)
Specialty Pharmacy Refill Coordination Note  Anthony Friedman is a 73 y.o. male contacted today regarding refills of specialty medication(s) Etanercept (Enbrel SureClick)   Patient requested Delivery   Delivery date: 04/26/23   Verified address: [address]  [place] [address]   Medication will be filled on 04/25/23.

## 2023-04-19 NOTE — Progress Notes (Signed)
Specialty Pharmacy Ongoing Clinical Assessment Note  Anthony Friedman is a 73 y.o. male who is being followed by the specialty pharmacy service for RxSp Rheumatoid Arthritis   Patient's specialty medication(s) reviewed today: Etanercept (Enbrel SureClick)   Missed doses in the last 4 weeks: 4   Patient/Caregiver did not have any additional questions or concerns.   Therapeutic benefit summary: Patient is achieving benefit   Adverse events/side effects summary: No adverse events/side effects   Patient's therapy is appropriate to: Continue    Goals Addressed             This Visit's Progress    Reduce signs and symptoms       Patient is on track. Patient will maintain adherence. Patient had multiple doses held due to shoulder surgery and symptoms have returned in wrists but prior to this he was well controlled on treatment.          Follow up:  6 months  Otto Herb Specialty Pharmacist

## 2023-04-24 ENCOUNTER — Other Ambulatory Visit: Payer: Self-pay
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
## 2023-05-18 ENCOUNTER — Other Ambulatory Visit (HOSPITAL_COMMUNITY): Payer: Self-pay

## 2023-05-22 ENCOUNTER — Other Ambulatory Visit: Payer: Self-pay

## 2023-05-22 NOTE — Progress Notes (Signed)
 Specialty Pharmacy Refill Coordination Note  Anthony Friedman is a 73 y.o. male contacted today regarding refills of specialty medication(s) Etanercept (Enbrel SureClick)   Patient requested Delivery   Delivery date: 05/24/23   Verified address: [address]  [place] [address]   Medication will be filled on 03.18.25.

## 2023-06-15 ENCOUNTER — Other Ambulatory Visit: Payer: Self-pay

## 2023-06-16 ENCOUNTER — Other Ambulatory Visit: Payer: Self-pay

## 2023-06-16 NOTE — Progress Notes (Signed)
 Specialty Pharmacy Refill Coordination Note  Tatum Massman is a 73 y.o. male contacted today regarding refills of specialty medication(s) Etanercept (Enbrel SureClick)   Patient requested (Patient-Rptd) Delivery   Delivery date: (Patient-Rptd) 06/22/23   Verified address: (Patient-Rptd) [address] [place] [address]   Medication will be filled on 04.16.25.

## 2023-06-21 ENCOUNTER — Other Ambulatory Visit: Payer: Self-pay

## 2023-06-21 NOTE — Progress Notes (Signed)
 Spoke to patient for Enbrel refill too soon. [facility]. Patient finished clinical trial and awaiting to hear back from MD. Patient does want to continue with us  for future refills. Patient will call back for updates.

## 2023-06-28 ENCOUNTER — Other Ambulatory Visit: Payer: Self-pay

## 2023-07-21 ENCOUNTER — Other Ambulatory Visit: Payer: Self-pay

## 2023-07-24 ENCOUNTER — Other Ambulatory Visit: Payer: Self-pay

## 2023-08-04 ENCOUNTER — Encounter: Payer: Self-pay | Admitting: Sports Medicine

## 2023-08-04 ENCOUNTER — Telehealth: Payer: Self-pay | Admitting: Sports Medicine

## 2023-08-04 ENCOUNTER — Ambulatory Visit

## 2023-08-04 ENCOUNTER — Ambulatory Visit (INDEPENDENT_AMBULATORY_CARE_PROVIDER_SITE_OTHER): Admitting: Sports Medicine

## 2023-08-04 DIAGNOSIS — M961 Postlaminectomy syndrome, not elsewhere classified: Secondary | ICD-10-CM

## 2023-08-04 DIAGNOSIS — M545 Low back pain, unspecified: Secondary | ICD-10-CM | POA: Diagnosis not present

## 2023-08-04 DIAGNOSIS — M533 Sacrococcygeal disorders, not elsewhere classified: Secondary | ICD-10-CM

## 2023-08-04 MED ORDER — GABAPENTIN 800 MG PO TABS: 800.0000 mg | ORAL_TABLET | Freq: Three times a day (TID) | ORAL | 3 refills | Status: AC

## 2023-08-04 MED ORDER — TRAMADOL HCL 50 MG PO TABS: 50.0000 mg | ORAL_TABLET | Freq: Three times a day (TID) | ORAL | 0 refills | Status: DC | PRN

## 2023-08-04 NOTE — Telephone Encounter (Signed)
 This request has been handled. The patient has been updated regarding the provider's note. Verbalized understanding. No further action is required.

## 2023-08-04 NOTE — Telephone Encounter (Signed)
 Patient called and he says he saw Dr. Sandy Crumb today and was under the impression tramadol would be sent to the pharmacy. Advised per notes he noted to continue tramadol. Advised it's not on current list. He says he had it prescribed by his PCP until he could see Dr. Elva Hamburger. He says he has 3 pills left. I asked him to read the medication dosage off the bottle and which pharmacy, advised I will send this to Dr. Elva Hamburger.  Tramadol 50 mg  [id number] [place], [place] - [address] Phone: [phone]  Fax: [phone]     Copied from CRM [id number]. Topic: Clinical - Prescription Issue >> Aug 04, 2023  4:16 PM Adrianna P wrote: Reason for CRM: Patient's pharmacy still waiting on prescription for tramadol

## 2023-08-04 NOTE — Telephone Encounter (Signed)
 I will start it, but once we find a stable dose we will hand this back off to the PCP since I do not do chronic narcotic treatment.

## 2023-08-04 NOTE — Progress Notes (Addendum)
    Procedures performed today:    None.  Independent interpretation of notes and tests performed by another provider:   None.  Brief History, Exam, Impression, and Recommendations:    Failed back syndrome of lumbar spine This is a very pleasant 73 year old male, he has chronic axial low back pain, he is status post L1-S1 fusion, spinal cord stimulator, not MRI safe, he has had multiple joint replacements including right shoulder arthroplasty, bilateral hip arthroplasties. He has had what sounds to be a sacroiliac joint injection with ultrasound guidance without any relief, not even temporary. He has had multiple medications, currently on gabapentin  approximately 600 mg twice a day, tramadol  was started but he stopped his gabapentin , seems to be helping to some degree. He never got any adverse effects such as sedation with his dose of gabapentin . He does endorse pain predominantly left sacroiliac joint, left low back, and a bit over the left greater trochanter, he has good hip abductor strength. Good internal/external rotation. I do think he has more of an SI joint pain generator. I have suggested that we get x-rays and a CT of his lumbar spine, as well as sacroiliac joints. We will increase gabapentin  to 800 mg 3 times daily, he can take this with tramadol , he can continue his tramadol  which she is currently using twice daily, I explained that the maximum tramadol  dose was 300 total daily milligrams so he did have a lot of room to go up on the tramadol  as well. We also set realistic expectations. I do suspect ultimately we will end up just having to find a good medical regimen for him. Return to see me to go over CT results and discuss injection if appropriate.  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  Severe facet arthritis at most of the lower levels.  Each of these structures can be pain generators.  We will start with maximizing medical therapy, and try a few  different injections if max tolerated medications do not sufficiently relieve the pain.  Update: Patient called back, it sounds like he would like to try the injections, ordering L4-L5 interlaminar epidural.    ____________________________________________ Joselyn Nicely. Sandy Crumb, M.D., ABFM., CAQSM., AME. Primary Care and Sports Medicine [facility]  Adjunct Professor of [facility] Medicine  [employer]  Restaurant manager, fast food

## 2023-08-04 NOTE — Telephone Encounter (Signed)
 FYI

## 2023-08-04 NOTE — Assessment & Plan Note (Addendum)
 This is a very pleasant 73 year old male, he has chronic axial low back pain, he is status post L1-S1 fusion, spinal cord stimulator, not MRI safe, he has had multiple joint replacements including right shoulder arthroplasty, bilateral hip arthroplasties. He has had what sounds to be a sacroiliac joint injection with ultrasound guidance without any relief, not even temporary. He has had multiple medications, currently on gabapentin  approximately 600 mg twice a day, tramadol  was started but he stopped his gabapentin , seems to be helping to some degree. He never got any adverse effects such as sedation with his dose of gabapentin . He does endorse pain predominantly left sacroiliac joint, left low back, and a bit over the left greater trochanter, he has good hip abductor strength. Good internal/external rotation. I do think he has more of an SI joint pain generator. I have suggested that we get x-rays and a CT of his lumbar spine, as well as sacroiliac joints. We will increase gabapentin  to 800 mg 3 times daily, he can take this with tramadol , he can continue his tramadol  which she is currently using twice daily, I explained that the maximum tramadol  dose was 300 total daily milligrams so he did have a lot of room to go up on the tramadol  as well. We also set realistic expectations. I do suspect ultimately we will end up just having to find a good medical regimen for him. Return to see me to go over CT results and discuss injection if appropriate.  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  Severe facet arthritis at most of the lower levels.  Each of these structures can be pain generators.  We will start with maximizing medical therapy, and try a few different injections if max tolerated medications do not sufficiently relieve the pain.  Update: Patient called back, it sounds like he would like to try the injections, ordering L4-L5 interlaminar epidural.

## 2023-08-08 ENCOUNTER — Ambulatory Visit

## 2023-08-08 DIAGNOSIS — M533 Sacrococcygeal disorders, not elsewhere classified: Secondary | ICD-10-CM | POA: Diagnosis not present

## 2023-08-08 DIAGNOSIS — M961 Postlaminectomy syndrome, not elsewhere classified: Secondary | ICD-10-CM

## 2023-08-13 ENCOUNTER — Ambulatory Visit: Payer: Self-pay | Admitting: Sports Medicine

## 2023-08-14 NOTE — Addendum Note (Signed)
 Addended by: Gean Keels on: 08/14/2023 05:00 PM   Modules accepted: Orders

## 2023-08-17 NOTE — Discharge Instructions (Signed)

## 2023-08-18 ENCOUNTER — Other Ambulatory Visit: Payer: Self-pay | Admitting: Sports Medicine

## 2023-08-18 ENCOUNTER — Ambulatory Visit
Admission: RE | Admit: 2023-08-18 | Discharge: 2023-08-18 | Disposition: A | Discharge status: Home / Self Care | Source: Ambulatory Visit | Attending: Sports Medicine | Admitting: Sports Medicine

## 2023-08-18 DIAGNOSIS — M961 Postlaminectomy syndrome, not elsewhere classified: Secondary | ICD-10-CM

## 2023-08-18 MED ORDER — METHYLPREDNISOLONE ACETATE 40 MG/ML INJ SUSP (RADIOLOG
80.0000 mg | Freq: Once | INTRAMUSCULAR | Status: AC
  Administered 2023-08-18: 80 mg via EPIDURAL

## 2023-08-18 MED ORDER — IOPAMIDOL (ISOVUE-M 200) INJECTION 41%
1.0000 mL | Freq: Once | INTRAMUSCULAR | Status: AC
  Administered 2023-08-18: 1 mL via EPIDURAL

## 2023-08-28 ENCOUNTER — Telehealth: Payer: Self-pay | Admitting: Sports Medicine

## 2023-08-28 DIAGNOSIS — M961 Postlaminectomy syndrome, not elsewhere classified: Secondary | ICD-10-CM

## 2023-08-28 NOTE — Telephone Encounter (Signed)
 Anthony Friedman spoke to Dr. Shoshana, who communicated to me that he would like another epidural.  This was ordered today.

## 2023-09-05 ENCOUNTER — Ambulatory Visit
Admission: RE | Admit: 2023-09-05 | Discharge: 2023-09-05 | Disposition: A | Discharge status: Home / Self Care | Source: Ambulatory Visit | Attending: Sports Medicine | Admitting: Sports Medicine

## 2023-09-05 DIAGNOSIS — M961 Postlaminectomy syndrome, not elsewhere classified: Secondary | ICD-10-CM

## 2023-09-05 MED ORDER — METHYLPREDNISOLONE ACETATE 40 MG/ML INJ SUSP (RADIOLOG
80.0000 mg | Freq: Once | INTRAMUSCULAR | Status: AC
  Administered 2023-09-05: 80 mg via EPIDURAL

## 2023-09-05 MED ORDER — IOPAMIDOL (ISOVUE-M 200) INJECTION 41%
1.0000 mL | Freq: Once | INTRAMUSCULAR | Status: AC
  Administered 2023-09-05: 1 mL via EPIDURAL

## 2023-09-05 NOTE — Discharge Instructions (Signed)

## 2023-09-21 ENCOUNTER — Ambulatory Visit (INDEPENDENT_AMBULATORY_CARE_PROVIDER_SITE_OTHER): Admitting: Sports Medicine

## 2023-09-21 ENCOUNTER — Encounter: Payer: Self-pay | Admitting: Sports Medicine

## 2023-09-21 DIAGNOSIS — M961 Postlaminectomy syndrome, not elsewhere classified: Secondary | ICD-10-CM

## 2023-09-21 MED ORDER — TRAMADOL HCL 50 MG PO TABS
100.0000 mg | ORAL_TABLET | Freq: Three times a day (TID) | ORAL | 0 refills | Status: DC | PRN
Start: 2023-09-21 — End: 2023-10-17

## 2023-09-21 NOTE — Progress Notes (Signed)
    Procedures performed today:    None.  Independent interpretation of notes and tests performed by another provider:   None.  Brief History, Exam, Impression, and Recommendations:    Failed back syndrome of lumbar spine Anthony Friedman returns, he has a very complicated history, he is a 73 year old male with chronic axial low back pain status post multilevel fusion, spinal cord stimulator not on MRI safe, multiple joint replacements including right shoulder, bilateral hips. He has chronic axial low back pain. He had a sacroiliac joint joint injection with ultrasound guidance with no relief, not even temporary. Has failed multiple medications, including gabapentin  high dose 800 mg 3 times daily. Pain is from the left SI joint, left low back and a bit over the left greater trochanter. Ultimately an updated CT did show severe stenosis both central and foraminal at L4-L5, severe facet arthritis worst at L4-L5 bilaterally. We did 2 L4-L5 epidurals neither of which helped. Today we will try to set him up for L4-L5 facet joint injections although I suspect the injection will need to be periarticular considering the amount of particular spurring and calcification. He quite uses tramadol  as directed so we will increase to a stable scheduled dose of 100 mg 3 times daily. As we are starting her run out of options within my specialty I am also going to set him up for a pain clinic consultation, I do think we are nearing maximal medical improvement with intervention and he will need narcotic pain management.    ____________________________________________ Anthony Friedman, M.D., ABFM., CAQSM., AME. Primary Care and Sports Medicine [facility]  Adjunct Professor of [facility] Medicine  [employer]  Restaurant manager, fast food

## 2023-09-21 NOTE — Assessment & Plan Note (Signed)
 Anthony Friedman returns, he has a very complicated history, he is a 73 year old male with chronic axial low back pain status post multilevel fusion, spinal cord stimulator not on MRI safe, multiple joint replacements including right shoulder, bilateral hips. He has chronic axial low back pain. He had a sacroiliac joint joint injection with ultrasound guidance with no relief, not even temporary. Has failed multiple medications, including gabapentin  high dose 800 mg 3 times daily. Pain is from the left SI joint, left low back and a bit over the left greater trochanter. Ultimately an updated CT did show severe stenosis both central and foraminal at L4-L5, severe facet arthritis worst at L4-L5 bilaterally. We did 2 L4-L5 epidurals neither of which helped. Today we will try to set him up for L4-L5 facet joint injections although I suspect the injection will need to be periarticular considering the amount of particular spurring and calcification. He quite uses tramadol  as directed so we will increase to a stable scheduled dose of 100 mg 3 times daily. As we are starting her run out of options within my specialty I am also going to set him up for a pain clinic consultation, I do think we are nearing maximal medical improvement with intervention and he will need narcotic pain management.

## 2023-09-28 ENCOUNTER — Telehealth: Payer: Self-pay

## 2023-09-28 NOTE — Telephone Encounter (Signed)
 Good morning Anairis,  Would you be able to get us  a phone  number to call and the patient's case/reference number for DG FACET JT INJ L /S SINGLE LEVEL RIGHT  and DG FACET JT INJ L /S SINGLE LEVEL LEFT W/FL/CT (Order [id number]) ?  WE had received a message from [facility] radiology requesting that DR. Thekkekandam do a peer to peer as soon as possible  for an appoval - patient is scheduled for Monday and  Dr. Shoshana is coming in for this injection especially for the patient.

## 2023-09-28 NOTE — Telephone Encounter (Signed)
 Wilbern dills with [facility] radiology informed via secure chat.

## 2023-09-28 NOTE — Telephone Encounter (Signed)
 Hello Anairis ,  You can disregard this request.  WE have gotten this information from radiology.

## 2023-09-28 NOTE — Telephone Encounter (Signed)
 Requesting peer to peer  For DG FACET JT INJ L /S SINGLE LEVEL LEFT W/FL/CT (Order [id number])   And  DG FACET JT INJ L /S SINGLE LEVEL RIGHT W/FL/CT (Order [id number])  Phone number for peer to peer  [phone] Reference # [id number] States will also need expiration date documented.

## 2023-09-28 NOTE — Telephone Encounter (Signed)
 Approved: [id number]  Good 09/28/23-03/26/2024.

## 2023-09-29 NOTE — Discharge Instructions (Signed)

## 2023-10-02 ENCOUNTER — Ambulatory Visit
Admission: RE | Admit: 2023-10-02 | Discharge: 2023-10-02 | Disposition: A | Discharge status: Home / Self Care | Source: Ambulatory Visit | Attending: Sports Medicine | Admitting: Sports Medicine

## 2023-10-02 DIAGNOSIS — M961 Postlaminectomy syndrome, not elsewhere classified: Secondary | ICD-10-CM

## 2023-10-02 MED ORDER — METHYLPREDNISOLONE ACETATE 40 MG/ML INJ SUSP (RADIOLOG
80.0000 mg | Freq: Once | INTRAMUSCULAR | Status: AC
  Administered 2023-10-02: 80 mg via INTRA_ARTICULAR

## 2023-10-02 MED ORDER — IOPAMIDOL (ISOVUE-M 200) INJECTION 41%
1.0000 mL | Freq: Once | INTRAMUSCULAR | Status: AC
  Administered 2023-10-02: 1 mL via INTRA_ARTICULAR

## 2023-10-09 ENCOUNTER — Telehealth: Payer: Self-pay

## 2023-10-09 NOTE — Telephone Encounter (Signed)
 I called and advised [facility]

## 2023-10-09 NOTE — Telephone Encounter (Signed)
 Copied from CRM [id number]. Topic: Referral - Question >> Oct 09, 2023  9:32 AM Alfonso ORN wrote: Reason for CRM: Quintin calling from  [facility] pain management recieved a referral regarding patient  and   additonal notes on the patient  what kind of DCS and where and who place the DCS , need mri notes  Need the information soon as possible  call back number for [facility] pain management [phone]

## 2023-10-09 NOTE — Telephone Encounter (Signed)
 I don't know what a DCS is (I'm assuming its a cord stimulator?) but Imaging notes are in the chart.

## 2023-10-09 NOTE — Telephone Encounter (Signed)
 Copied from CRM #[id number]. Topic: Clinical - Medication Question >> Oct 09, 2023 11:29 AM [name] wrote: Reason for CRM: Patient is asking if he still needs to take 2400 mg of gapapetin, if so he will need a new prescription. Please advise. Call back #[phone]

## 2023-10-10 NOTE — Telephone Encounter (Signed)
 Spoke with Anthony Friedman at [facility].  He will get clarification from his doctor as to what DCS  is and will call us  back with more information.

## 2023-10-11 NOTE — Telephone Encounter (Signed)
 Quintin with [facility]  [phone] Fax# [phone] States DCS statnds for  dorsal column stimulator or spinal stimulator Provider is needing to know  What kind of DCS it is  Who placed it  and  MRI notes   I have reviewed patient care everywhere and did not find this information. Called patient  [employer] spine stimulator DR. Victur Freund - [facility] but this provider has moved  Was placed 15- 20 years ago .  Last used 13 years ago.  Can not have an MRI due to spinal stimulator  in place  Could not locate an MRI done before this was placed.  Attempted to call Quintin with this information no answer at office. Will attempt at later time.

## 2023-10-11 NOTE — Telephone Encounter (Signed)
 Spoke with Anthony Friedman - with [facility] and informed of information obtained from the patient.

## 2023-10-11 NOTE — Telephone Encounter (Signed)
 Called [facility] again and left a voice mail message requesting a return call from [place].

## 2023-10-17 ENCOUNTER — Other Ambulatory Visit: Payer: Self-pay | Admitting: Sports Medicine

## 2023-10-17 DIAGNOSIS — M961 Postlaminectomy syndrome, not elsewhere classified: Secondary | ICD-10-CM

## 2023-10-17 NOTE — Telephone Encounter (Signed)
 Copied from CRM [id number]. Topic: Clinical - Medication Refill >> Oct 17, 2023 10:12 AM [name] wrote: Medication: traMADol  (ULTRAM ) 50 MG tablet  Has the patient contacted their pharmacy? Yes (Agent: If no, request that the patient contact the pharmacy for the refill. If patient does not wish to contact the pharmacy document the reason why and proceed with request.) (Agent: If yes, when and what did the pharmacy advise?)  This is the patient's preferred pharmacy:  [id number] [place], [place] - [address] [place] [address] Phone: [phone] Fax: [phone]   Is this the correct pharmacy for this prescription? Yes If no, delete pharmacy and type the correct one.   Has the prescription been filled recently? No  Is the patient out of the medication? No, will run out on Thursday  Has the patient been seen for an appointment in the last year OR does the patient have an upcoming appointment? Yes  Can we respond through MyChart? Yes  Agent: Please be advised that Rx refills may take up to 3 business days. We ask that you follow-up with your pharmacy.

## 2023-10-18 ENCOUNTER — Encounter: Payer: Self-pay | Admitting: Sports Medicine

## 2023-10-18 MED ORDER — TRAMADOL HCL 50 MG PO TABS: 100.0000 mg | ORAL_TABLET | Freq: Three times a day (TID) | ORAL | 0 refills | Status: DC | PRN

## 2023-11-01 ENCOUNTER — Encounter (HOSPITAL_BASED_OUTPATIENT_CLINIC_OR_DEPARTMENT_OTHER): Payer: Self-pay | Admitting: Emergency Medicine

## 2023-11-01 ENCOUNTER — Emergency Department (HOSPITAL_BASED_OUTPATIENT_CLINIC_OR_DEPARTMENT_OTHER)
Admission: EM | Admit: 2023-11-01 | Discharge: 2023-11-01 | Disposition: A | Discharge status: Home / Self Care | Attending: Emergency Medicine | Admitting: Emergency Medicine

## 2023-11-01 ENCOUNTER — Other Ambulatory Visit: Payer: Self-pay

## 2023-11-01 ENCOUNTER — Emergency Department (HOSPITAL_BASED_OUTPATIENT_CLINIC_OR_DEPARTMENT_OTHER)

## 2023-11-01 ENCOUNTER — Ambulatory Visit: Admitting: Sports Medicine

## 2023-11-01 DIAGNOSIS — I10 Essential (primary) hypertension: Secondary | ICD-10-CM | POA: Diagnosis not present

## 2023-11-01 DIAGNOSIS — Z96611 Presence of right artificial shoulder joint: Secondary | ICD-10-CM | POA: Insufficient documentation

## 2023-11-01 DIAGNOSIS — J449 Chronic obstructive pulmonary disease, unspecified: Secondary | ICD-10-CM | POA: Insufficient documentation

## 2023-11-01 DIAGNOSIS — Z79899 Other long term (current) drug therapy: Secondary | ICD-10-CM | POA: Insufficient documentation

## 2023-11-01 DIAGNOSIS — E119 Type 2 diabetes mellitus without complications: Secondary | ICD-10-CM | POA: Diagnosis not present

## 2023-11-01 DIAGNOSIS — M25511 Pain in right shoulder: Secondary | ICD-10-CM | POA: Diagnosis present

## 2023-11-01 DIAGNOSIS — Z7982 Long term (current) use of aspirin: Secondary | ICD-10-CM | POA: Diagnosis not present

## 2023-11-01 MED ORDER — METHOCARBAMOL 500 MG PO TABS: 500.0000 mg | ORAL_TABLET | Freq: Two times a day (BID) | ORAL | 0 refills | Status: DC

## 2023-11-01 MED ORDER — METHOCARBAMOL 500 MG PO TABS
500.0000 mg | ORAL_TABLET | Freq: Once | ORAL | Status: AC
  Administered 2023-11-01: 500 mg via ORAL
  Filled 2023-11-01: qty 1

## 2023-11-01 MED ORDER — HYDROCODONE-ACETAMINOPHEN 5-325 MG PO TABS
1.0000 | ORAL_TABLET | Freq: Once | ORAL | Status: AC
  Administered 2023-11-01: 1 via ORAL
  Filled 2023-11-01: qty 1

## 2023-11-01 NOTE — Discharge Instructions (Signed)
 It was a pleasure taking care of you today.  As discussed, your x-ray was unremarkable.  I am sending you home with a muscle relaxer.  Take as needed for pain.  Muscle relaxer can cause drowsiness so do not drive or operate machinery while on the medication.  Please call Dr. Thelda office tomorrow to schedule an appointment.  Return to the ER for any worsening symptoms.

## 2023-11-01 NOTE — ED Provider Notes (Signed)
 [facility] [place] Provider Note   CSN: [id number] Arrival date & time: 11/01/23  1644     Patient presents with: Shoulder Pain   Anthony Friedman is a 73 y.o. male with a past medical history significant for COPD, RA, hypertension, and diabetes who presents to the ED due to right shoulder pain.  Patient had a reverse shoulder arthroplasty by Dr. Dozier in January 2025.  Patient states he then fell and fractured his posterior right acromion in April 2025.  Patient admits to chronic pain since.  Is currently on tramadol  and gabapentin .  Notes today he developed severe right shoulder pain.  No recent injury.  Denies numbness/tingling to right upper extremity.  No neck pain.  Denies fever or chills.  No edema, erythema, or warmth to right shoulder.  Pain worse with overhead movements. Denies chest pain and shortness of breath.  History obtained from patient and past medical records. No interpreter used during encounter.      Prior to Admission medications   Medication Sig Start Date End Date Taking? Authorizing Provider  methocarbamol  (ROBAXIN ) 500 MG tablet Take 1 tablet (500 mg total) by mouth 2 (two) times daily. 11/01/23  Yes Lorelle Aleck BROCKS, PA-C  aspirin  EC 81 MG tablet Take 1 tablet (81 mg total) by mouth daily. 05/04/17   Hongalgi, Anand D, MD  Calcium  Carb-Cholecalciferol  (CALCIUM  600 + D PO) Take 1 tablet by mouth 3 (three) times daily.     [provider]  cetirizine (ZYRTEC) 10 MG tablet Take 10 mg by mouth daily.    [provider]  cyclobenzaprine  (FLEXERIL ) 10 MG tablet Take 1 tablet (10 mg total) by mouth 2 (two) times daily as needed for muscle spasms. 02/26/23   Dreama Longs, MD  DULoxetine (CYMBALTA) 60 MG capsule Take 60 mg by mouth daily in the afternoon.    [provider]  etanercept  (ENBREL  SURECLICK) 50 MG/ML injection INJECT 1 PEN SUBCUTANEOUSLY EVERY 7 DAYS 12/28/22     folic acid  (FOLVITE ) 800 MCG  tablet Take 800 mcg by mouth daily.    [provider]  gabapentin  (NEURONTIN ) 800 MG tablet Take 1 tablet (800 mg total) by mouth 3 (three) times daily. 08/04/23   Curtis Debby PARAS, MD  hydroxychloroquine (PLAQUENIL) 200 MG tablet Take 200 mg by mouth 2 (two) times daily.    [provider]  methotrexate (RHEUMATREX) 2.5 MG tablet Take 20 tablets by mouth every Friday. 10/21/18   [provider]  Multiple Vitamins-Iron (MULTIVITAMIN/IRON PO) Take 1 tablet by mouth 2 (two) times daily.    [provider]  rOPINIRole  (REQUIP ) 1 MG tablet Take 1-2 mg by mouth See admin instructions. Take 2 mg in the afternoon and 1 mg at night    [provider]  rosuvastatin (CRESTOR) 5 MG tablet Take 5 mg by mouth daily.    [provider]  tamsulosin  (FLOMAX ) 0.4 MG CAPS capsule Take 0.4 mg by mouth daily.     [provider]  traMADol  (ULTRAM ) 50 MG tablet Take 2 tablets (100 mg total) by mouth every 8 (eight) hours as needed. 10/18/23 11/17/23  Curtis Debby PARAS, MD  traZODone  (DESYREL ) 100 MG tablet Take 1 tablet (100 mg total) by mouth at bedtime as needed for sleep. 06/22/20   Sood, Vineet, MD  trospium (SANCTURA) 20 MG tablet Take 20 mg by mouth 2 (two) times daily. 04/16/18   [provider]  vitamin B-12 (CYANOCOBALAMIN ) 500 MCG tablet  Take 500 mcg by mouth 2 (two) times a week.    [provider]    Allergies: Atenolol  and Sulfa antibiotics    Review of Systems  Constitutional:  Negative for fever.  Respiratory:  Negative for shortness of breath.   Cardiovascular:  Negative for chest pain.  Musculoskeletal:  Positive for arthralgias.    Updated Vital Signs BP (!) 140/82 (BP Location: Left Arm)   Pulse 91   Temp 98.7 F (37.1 C) (Oral)   Resp 18   Ht 5' 9 (1.753 m)   Wt 93 kg   SpO2 95%   BMI 30.27 kg/m   Physical Exam Vitals and nursing note reviewed.  Constitutional:      General: He is not in acute  distress.    Appearance: He is not ill-appearing.  HENT:     Head: Normocephalic.  Eyes:     Pupils: Pupils are equal, round, and reactive to light.  Cardiovascular:     Rate and Rhythm: Normal rate and regular rhythm.     Pulses: Normal pulses.     Heart sounds: Normal heart sounds. No murmur heard.    No friction rub. No gallop.  Pulmonary:     Effort: Pulmonary effort is normal.     Breath sounds: Normal breath sounds.  Abdominal:     General: Abdomen is flat. There is no distension.     Palpations: Abdomen is soft.     Tenderness: There is no abdominal tenderness. There is no guarding or rebound.  Musculoskeletal:        General: Normal range of motion.     Cervical back: Neck supple.     Comments: Tenderness to palpation throughout right shoulder and proximal humerus.  No erythema, edema, or warmth.  Decreased range of motion secondary to pain.  Radial pulse intact.  Soft compartments.  Skin:    General: Skin is warm and dry.  Neurological:     General: No focal deficit present.     Mental Status: He is alert.  Psychiatric:        Mood and Affect: Mood normal.        Behavior: Behavior normal.     (all labs ordered are listed, but only abnormal results are displayed) Labs Reviewed - No data to display  EKG: None  Radiology: DG Shoulder Right Result Date: 11/01/2023 CLINICAL DATA:  Possible injury to the right shoulder. EXAM: RIGHT SHOULDER - 2+ VIEW COMPARISON:  None Available. FINDINGS: There is a total right shoulder arthroplasty. The arthroplasty components appear intact and in anatomic alignment. There is no acute fracture or dislocation. Degenerative changes of the right AC joint. The soft tissues are unremarkable. IMPRESSION: 1. No acute fracture or dislocation. 2. Right shoulder arthroplasty. Electronically Signed   By: Vanetta Chou M.D.   On: 11/01/2023 17:38     Procedures   Medications Ordered in the ED  HYDROcodone -acetaminophen  (NORCO/VICODIN)  5-325 MG per tablet 1 tablet (1 tablet Oral Given 11/01/23 1737)  methocarbamol  (ROBAXIN ) tablet 500 mg (500 mg Oral Given 11/01/23 1737)                                    Medical Decision Making Amount and/or Complexity of Data Reviewed External Data Reviewed: radiology.    Details: CT shoulder Radiology: ordered and independent interpretation performed. Decision-making details documented in ED Course.  Risk Prescription drug management.  73 year old male presents to the ED due to right shoulder pain that started today.  Patient had a reverse shoulder arthroplasty in January 2025 and then fell in April with an acute fracture.  No recent injury.  Denies numbness/tingling.  No fever or chills.  Upon arrival, patient afebrile, not tachycardic or hypoxic.  Patient in no acute distress.  Does have some tenderness to right shoulder and proximal humerus.  Right upper extremity neurovascularly intact with soft compartments.  Low suspicion for compartment syndrome.  No evidence of infection.  Low suspicion for septic joint.  X-ray ordered to rule out bony fracture and to check hardware.  Hydrocodone  and Robaxin  given.  Will likely need orthopedics follow-up.  X-ray personally reviewed and interpreted which is negative for any acute abnormalities.  Does demonstrate right shoulder arthroplasty.  Upon reassessment, patient notes improvement in pain.  Will add Robaxin  to patient's pain regimen.  Advised patient to call Dr. Thelda office tomorrow to schedule an appointment for further evaluation.  EKG demonstrates sinus arrhythmia.  Low suspicion for ACS Or cardiac etiologies of shoulder pain.  Patient stable for discharge. Strict ED precautions discussed with patient. Patient states understanding and agrees to plan. Patient discharged home in no acute distress and stable vitals      Final diagnoses:  Acute pain of right shoulder    ED Discharge Orders          Ordered    methocarbamol   (ROBAXIN ) 500 MG tablet  2 times daily        11/01/23 1819               Lorelle Aleck JAYSON DEVONNA 11/01/23 TRENNA Armenta Canning, MD 11/02/23 [phone]

## 2023-11-01 NOTE — ED Triage Notes (Signed)
 Pt c/o right shoulder pain, has had 2 surgeries on same shoulder this year, pt denies injury but reports increased pain today

## 2023-11-07 ENCOUNTER — Encounter: Payer: Self-pay | Admitting: Sports Medicine

## 2023-11-21 ENCOUNTER — Other Ambulatory Visit: Payer: Self-pay

## 2023-11-21 DIAGNOSIS — M961 Postlaminectomy syndrome, not elsewhere classified: Secondary | ICD-10-CM

## 2023-11-21 MED ORDER — TRAMADOL HCL 50 MG PO TABS: 100.0000 mg | ORAL_TABLET | Freq: Three times a day (TID) | ORAL | 0 refills | Status: DC | PRN

## 2023-11-21 NOTE — Telephone Encounter (Signed)
 Copied from CRM [id number]. Topic: Clinical - Medication Question >> Nov 21, 2023  3:12 PM Diannia H wrote: Reason for CRM: Patient was a patient of Dr. ONEIDA who needs medication refills but doesn't know if he can get them since the provider is gone. Contacted the clinic

## 2023-11-21 NOTE — Telephone Encounter (Signed)
 Forwarding message to Dr. Alvia covering Dr. Curtis  Patient request rx rf of Tramadol  50mg   States has upcoming initial appt with pain management doctor  schld Oct 22nd - [facility] who will then take over presciption Last written 10/18/2023 Last OV 09/21/2023 Upcoming appt = none

## 2023-12-21 ENCOUNTER — Other Ambulatory Visit: Payer: Self-pay | Admitting: Family Medicine

## 2023-12-21 DIAGNOSIS — M961 Postlaminectomy syndrome, not elsewhere classified: Secondary | ICD-10-CM

## 2023-12-21 NOTE — Telephone Encounter (Signed)
 Copied from CRM [id number]. Topic: Clinical - Medication Refill >> Dec 21, 2023  3:32 PM Kendralyn S wrote: Medication: traMADol  (ULTRAM ) 50 MG tablet  Has the patient contacted their pharmacy? Yes (Agent: If no, request that the patient contact the pharmacy for the refill. If patient does not wish to contact the pharmacy document the reason why and proceed with request.) (Agent: If yes, when and what did the pharmacy advise?)  This is the patient's preferred pharmacy:  [id number] [place] - [address] [place] [address] Phone: [phone] Fax: [phone]  Is this the correct pharmacy for this prescription? Yes If no, delete pharmacy and type the correct one.   Has the prescription been filled recently? No  Is the patient out of the medication? Yes  Has the patient been seen for an appointment in the last year OR does the patient have an upcoming appointment? Yes  Can we respond through MyChart? Yes  Agent: Please be advised that Rx refills may take up to 3 business days. We ask that you follow-up with your pharmacy.

## 2023-12-22 NOTE — Telephone Encounter (Signed)
 Patient is calling to follow up on the request for medication refill. Advised that medication refills request can take up to 3 business days. Patient states that he is going out of town tomorrow.

## 2023-12-26 NOTE — Telephone Encounter (Signed)
 Patient calling to check on request. Advised of denial and to reach out to PCP. Patient disconnected.

## 2024-01-05 ENCOUNTER — Other Ambulatory Visit: Payer: Self-pay

## 2024-01-05 NOTE — Progress Notes (Signed)
 Disenrolled - Enbrel  last filled 3.18.25 (227 days ago). Medication was filled at [facility] back in May. Specialty unable to reach patient after multiple attempts and patient never followed up with pharmacy.

## 2024-01-29 ENCOUNTER — Other Ambulatory Visit: Payer: Self-pay | Admitting: Orthopedic Surgery

## 2024-02-21 ENCOUNTER — Other Ambulatory Visit: Payer: Self-pay

## 2024-03-18 NOTE — Patient Instructions (Signed)
 SURGICAL WAITING ROOM VISITATION  Patients having surgery or a procedure may have no more than 2 support people in the waiting area - these visitors may rotate.    Children ages 58 and under will not be able to visit patients in [facility] under most circumstances.   Visitors with respiratory illnesses are discouraged from visiting and should remain at home.  If the patient needs to stay at the hospital during part of their recovery, the visitor guidelines for inpatient rooms apply. Pre-op nurse will coordinate an appropriate time for 1 support person to accompany patient in pre-op.  This support person may not rotate.    Please refer to the [facility] website for the visitor guidelines for Inpatients (after your surgery is over and you are in a regular room).       Your procedure is scheduled on:   03/28/2024    Report to [facility] Main Entrance    Report to admitting at   0800AM   Call this number if you have problems the morning of surgery [phone]   Do not eat food :After Midnight.   After Midnight you may have the following liquids until _ 0730_____ AM DAY OF SURGERY  Water  Non-Citrus Juices (without pulp, NO RED-Apple, White grape, White cranberry) Black Coffee (NO MILK/CREAM OR CREAMERS, sugar ok)  Clear Tea (NO MILK/CREAM OR CREAMERS, sugar ok) regular and decaf                             Plain Jell-O (NO RED)                                           Fruit ices (not with fruit pulp, NO RED)                                     Popsicles (NO RED)                                                               Sports drinks like Gatorade (NO RED)                    The day of surgery:  Drink ONE (1) Pre-Surgery Clear Ensure or G2 at   0730AM the morning of surgery. Drink in one sitting. Do not sip.  This drink was given to you during your hospital  pre-op appointment visit. Nothing else to drink after completing the  Pre-Surgery Clear Ensure  or G2.          If you have questions, please contact your surgeons office.       Oral Hygiene is also important to reduce your risk of infection.                                    Remember - BRUSH YOUR TEETH THE MORNING OF SURGERY WITH YOUR REGULAR TOOTHPASTE  DENTURES WILL BE REMOVED PRIOR TO SURGERY PLEASE DO NOT  APPLY Poly grip OR ADHESIVES!!!   Do NOT smoke after Midnight   Stop all vitamins and herbal supplements 7 days before surgery.   Take these medicines the morning of surgery with A SIP OF WATER :  zyrtec, gabapentin , flomax    DO NOT TAKE ANY ORAL DIABETIC MEDICATIONS DAY OF YOUR SURGERY  Bring CPAP mask and tubing day of surgery.                              You may not have any metal on your body including hair pins, jewelry, and body piercing             Do not wear make-up, lotions, powders, perfumes/cologne, or deodorant  Do not wear nail polish including gel and S&S, artificial/acrylic nails, or any other type of covering on natural nails including finger and toenails. If you have artificial nails, gel coating, etc. that needs to be removed by a nail salon please have this removed prior to surgery or surgery may need to be canceled/ delayed if the surgeon/ anesthesia feels like they are unable to be safely monitored.   Do not shave  48 hours prior to surgery.               Men may shave face and neck.   Do not bring valuables to the hospital. [facility] IS NOT             RESPONSIBLE   FOR VALUABLES.   Contacts, glasses, dentures or bridgework may not be worn into surgery.   Bring small overnight bag day of surgery.   DO NOT BRING YOUR HOME MEDICATIONS TO THE HOSPITAL. PHARMACY WILL DISPENSE MEDICATIONS LISTED ON YOUR MEDICATION LIST TO YOU DURING YOUR ADMISSION IN THE HOSPITAL!    Patients discharged on the day of surgery will not be allowed to drive home.  Someone NEEDS to stay with you for the first 24 hours after anesthesia.   Special Instructions:  Bring a copy of your healthcare power of attorney and living will documents the day of surgery if you haven't scanned them before.              Please read over the following fact sheets you were given: IF YOU HAVE QUESTIONS ABOUT YOUR PRE-OP INSTRUCTIONS PLEASE CALL [phone].   If you received a COVID test during your pre-op visit  it is requested that you wear a mask when out in public, stay away from anyone that may not be feeling well and notify your surgeon if you develop symptoms. If you test positive for Covid or have been in contact with anyone that has tested positive in the last 10 days please notify you surgeon.      Pre-operative 4 CHG Bath Instructions   You can play a key role in reducing the risk of infection after surgery. Your skin needs to be as free of germs as possible. You can reduce the number of germs on your skin by washing with CHG (chlorhexidine  gluconate) soap before surgery. CHG is an antiseptic soap that kills germs and continues to kill germs even after washing.   DO NOT use if you have an allergy to chlorhexidine /CHG or antibacterial soaps. If your skin becomes reddened or irritated, stop using the CHG and notify one of our RNs at [phone].   Please shower with the CHG soap starting 4 days before surgery using the following schedule:  Please keep in mind the following:  DO NOT shave, including legs and underarms, starting the day of your first shower.   You may shave your face at any point before/day of surgery.  Place clean sheets on your bed the day you start using CHG soap. Use a clean washcloth (not used since being washed) for each shower. DO NOT sleep with pets once you start using the CHG.   CHG Shower Instructions:  If you choose to wash your hair and private area, wash first with your normal shampoo/soap.  After you use shampoo/soap, rinse your hair and body thoroughly to remove shampoo/soap residue.  Turn the water  OFF and apply about 3  tablespoons (45 ml) of CHG soap to a CLEAN washcloth.  Apply CHG soap ONLY FROM YOUR NECK DOWN TO YOUR TOES (washing for 3-5 minutes)  DO NOT use CHG soap on face, private areas, open wounds, or sores.  Pay special attention to the area where your surgery is being performed.  If you are having back surgery, having someone wash your back for you may be helpful. Wait 2 minutes after CHG soap is applied, then you may rinse off the CHG soap.  Pat dry with a clean towel  Put on clean clothes/pajamas   If you choose to wear lotion, please use ONLY the CHG-compatible lotions on the back of this paper.     Additional instructions for the day of surgery: DO NOT APPLY any lotions, deodorants, cologne, or perfumes.   Put on clean/comfortable clothes.  Brush your teeth.  Ask your nurse before applying any prescription medications to the skin.      CHG Compatible Lotions   Aveeno Moisturizing lotion  Cetaphil Moisturizing Cream  Cetaphil Moisturizing Lotion  Clairol Herbal Essence Moisturizing Lotion, Dry Skin  Clairol Herbal Essence Moisturizing Lotion, Extra Dry Skin  Clairol Herbal Essence Moisturizing Lotion, Normal Skin  Curel Age Defying Therapeutic Moisturizing Lotion with Alpha Hydroxy  Curel Extreme Care Body Lotion  Curel Soothing Hands Moisturizing Hand Lotion  Curel Therapeutic Moisturizing Cream, Fragrance-Free  Curel Therapeutic Moisturizing Lotion, Fragrance-Free  Curel Therapeutic Moisturizing Lotion, Original Formula  Eucerin Daily Replenishing Lotion  Eucerin Dry Skin Therapy Plus Alpha Hydroxy Crme  Eucerin Dry Skin Therapy Plus Alpha Hydroxy Lotion  Eucerin Original Crme  Eucerin Original Lotion  Eucerin Plus Crme Eucerin Plus Lotion  Eucerin TriLipid Replenishing Lotion  Keri Anti-Bacterial Hand Lotion  Keri Deep Conditioning Original Lotion Dry Skin Formula Softly Scented  Keri Deep Conditioning Original Lotion, Fragrance Free Sensitive Skin Formula  Keri  Lotion Fast Absorbing Fragrance Free Sensitive Skin Formula  Keri Lotion Fast Absorbing Softly Scented Dry Skin Formula  Keri Original Lotion  Keri Skin Renewal Lotion Keri Silky Smooth Lotion  Keri Silky Smooth Sensitive Skin Lotion  Nivea Body Creamy Conditioning Oil  Nivea Body Extra Enriched Lotion  Nivea Body Original Lotion  Nivea Body Sheer Moisturizing Lotion Nivea Crme  Nivea Skin Firming Lotion  NutraDerm 30 Skin Lotion  NutraDerm Skin Lotion  NutraDerm Therapeutic Skin Cream  NutraDerm Therapeutic Skin Lotion  ProShield Protective Hand Cream  Provon moisturizing lotion  [place]- Preparing for Total Shoulder Arthroplasty    Before surgery, you can play an important role. Because skin is not sterile, your skin needs to be as free of germs as possible. You can reduce the number of germs on your skin by using the following products. Benzoyl Peroxide Gel Reduces the number of germs present on the skin Applied twice a  day to shoulder area starting two days before surgery    ==================================================================  Please follow these instructions carefully:  BENZOYL PEROXIDE 5% GEL  Please do not use if you have an allergy to benzoyl peroxide.   If your skin becomes reddened/irritated stop using the benzoyl peroxide.  Starting two days before surgery, apply as follows: Apply benzoyl peroxide in the morning and at night. Apply after taking a shower. If you are not taking a shower clean entire shoulder front, back, and side along with the armpit with a clean wet washcloth.  Place a quarter-sized dollop on your shoulder and rub in thoroughly, making sure to cover the front, back, and side of your shoulder, along with the armpit.   2 days before ____ AM   ____ PM              1 day before ____ AM   ____ PM                         Do this twice a day for two days.  (Last application is the night before surgery, AFTER using the CHG soap as  described below).  Do NOT apply benzoyl peroxide gel on the day of surgery.

## 2024-03-18 NOTE — Progress Notes (Signed)
 Anesthesia Review:  PCP: Cardiologist : Rojelio Initial consult on 01/25/24   PPM/ ICD: Device Orders: Rep Notified:  Chest x-ray : EKG : 11/01/23  and 11/20 at [facility]  Echo : 2017  Stress test: Cardiac Cath :   Activity level:  Sleep Study/ CPAP : Fasting Blood Sugar :      / Checks Blood Sugar -- times a day:    Blood Thinner/ Instructions /Last Dose: ASA / Instructions/ Last Dose :    81 mg aspirin 

## 2024-03-20 ENCOUNTER — Ambulatory Visit (HOSPITAL_COMMUNITY)
Admission: RE | Admit: 2024-03-20 | Discharge: 2024-03-20 | Disposition: A | Discharge status: Home / Self Care | Source: Ambulatory Visit | Attending: Orthopedic Surgery | Admitting: Orthopedic Surgery

## 2024-03-20 ENCOUNTER — Encounter (HOSPITAL_COMMUNITY)
Admission: RE | Admit: 2024-03-20 | Discharge: 2024-03-20 | Disposition: A | Discharge status: Home / Self Care | Source: Ambulatory Visit | Attending: Orthopedic Surgery | Admitting: Orthopedic Surgery

## 2024-03-20 ENCOUNTER — Other Ambulatory Visit: Payer: Self-pay

## 2024-03-20 ENCOUNTER — Encounter (HOSPITAL_COMMUNITY): Payer: Self-pay

## 2024-03-20 VITALS — BP 110/61 | HR 68 | Temp 98.6°F | Resp 16 | Ht 69.0 in | Wt 174.2 lb

## 2024-03-20 DIAGNOSIS — Z01818 Encounter for other preprocedural examination: Secondary | ICD-10-CM | POA: Insufficient documentation

## 2024-03-20 HISTORY — DX: Dyspnea, unspecified: R06.00

## 2024-03-20 LAB — BASIC METABOLIC PANEL WITH GFR
Anion gap: 9 (ref 5–15)
BUN: 12 mg/dL (ref 8–23)
CO2: 27 mmol/L (ref 22–32)
Calcium: 9.3 mg/dL (ref 8.9–10.3)
Chloride: 101 mmol/L (ref 98–111)
Creatinine, Ser: 0.94 mg/dL (ref 0.61–1.24)
GFR, Estimated: >60 mL/min
Glucose, Bld: 134 mg/dL — ABNORMAL HIGH (ref 70–99)
Potassium: 4.6 mmol/L (ref 3.5–5.1)
Sodium: 138 mmol/L (ref 135–145)

## 2024-03-20 LAB — CBC
HCT: 35.2 % — ABNORMAL LOW (ref 39.0–52.0)
Hemoglobin: 12.8 g/dL — ABNORMAL LOW (ref 13.0–17.0)
MCH: 37.2 pg — ABNORMAL HIGH (ref 26.0–34.0)
MCHC: 36.4 g/dL — ABNORMAL HIGH (ref 30.0–36.0)
MCV: 102.3 fL — ABNORMAL HIGH (ref 80.0–100.0)
Platelets: 226 K/uL (ref 150–400)
RBC: 3.44 MIL/uL — ABNORMAL LOW (ref 4.22–5.81)
RDW: 15 % (ref 11.5–15.5)
WBC: 6.6 K/uL (ref 4.0–10.5)
nRBC: 0 % (ref 0.0–0.2)

## 2024-03-20 LAB — SURGICAL PCR SCREEN
MRSA, PCR: NEGATIVE
Staphylococcus aureus: NEGATIVE

## 2024-03-21 NOTE — Progress Notes (Signed)
 " Case: [id number] Date/Time: 03/28/24 1021   Procedure: ARTHROPLASTY, SHOULDER, TOTAL, REVERSE (Left: Shoulder)   Anesthesia type: Choice   Diagnosis: Arthritis of left shoulder region [M19.012]   Pre-op diagnosis: LEFT SHOULDER OSTEOARTHRITIS   Location: [facility] ROOM 07 / [facility]   Surgeons: Dozier Soulier, MD       DISCUSSION: Anthony Friedman is a 74 yo male with PMH of former smoking, HTN, CAD (on imaging), COPD, DOE, OSA (does not use after weight loss), s/p gastric sleeve (2017), RA, hx of lumbar fusion L1-S1, s/p spinal cord stimulator  There is Family history of adverse reaction to anesthesia with patient's mother however patient reports he is not sure what the reaction was. He has had multiple surgeries without any notable complications  Patient evaluated by cardiology at [facility] for coronary calcifications on CT with DOE. Underwent nuclear stress testing in March 2018 which was normal per notes (report not available). Seen again at [facility] for CAD on 01/25/24. Echo done on 02/08/2023 revealed normal LV function (60 to 65%), without any significant valvular heart disease. He was advised to continue medical therapy and f/u in 6 months.  Patient with hx of chronic neck and back pain and is s/p lumbar fusion and spinal cord stimulator placement at the T9-10 level. Follows with pain management.   Seen by PCP on 12/27/23. He reported unintentional weight loss and w/u initiated. Advised referral to GI if w/u unremarkable. CT Chest with stable pulmonary nodules and COPD.   VS: BP 110/61   Pulse 68   Temp 37 C (Oral)   Resp 16   Ht 5' 9 (1.753 m)   Wt 79 kg   SpO2 95%   BMI 25.72 kg/m   PROVIDERS: Alray Loader, MD   LABS: Labs reviewed: Acceptable for surgery. (all labs ordered are listed, but only abnormal results are displayed)  Labs Reviewed  CBC - Abnormal; Notable for the following components:      Result Value   RBC 3.44 (*)    Hemoglobin 12.8 (*)    HCT 35.2 (*)    MCV 102.3  (*)    MCH 37.2 (*)    MCHC 36.4 (*)    All other components within normal limits  BASIC METABOLIC PANEL WITH GFR - Abnormal; Notable for the following components:   Glucose, Bld 134 (*)    All other components within normal limits  SURGICAL PCR SCREEN    CT chest 01/08/2024 ([facility]):  FINDINGS:  LUNG NODULES: Chronic Subcentimeter pulmonary nodules (series 2/145, 187).  LUNGS: No acute abnormality. Mild upper lobe predominant centrilobular and paraseptal emphysema.  PLEURA: No pleural thickening or effusion.  HEART: Heart size normal. No pericardial effusion.  CORONARY ARTERY CALCIFICATION: Heavy.  MEDIASTINUM/HILUM/AXILLA: No adenopathy.  OTHER FINDINGS: Chronic elevation of the right hemidiaphragm. Status post right shoulder arthroplasty. Redemonstration of spinal cord stimulator in the midthoracic region.  Lumbar spine xray 08/04/23:  IMPRESSION: 1. Grade 1 anterolisthesis at L4-5. 2. Vacuum disc at L2-3 suggests nonunion. 3. Pedicle screws at L1, L2 and L3. 4. Stable dorsal spinal cord stimulator, entering at T9-10.  EKG 11/01/2023:  Sinus rhythm PACs Low voltage, precordial leads   ECHO 02/08/2023 ([facility]):  SUMMARY Normal LV size, wall thickness, wall motion and systolic function with ejection fraction 60-65% The left ventricular diastolic function is probably normal for age, with likely normal left atrial pressure. The right ventricle is normal in size and function. The atria are normal in size. There is no significant valvular stenosis  or regurgitation. No pulmonary hypertension. IVC size was mildly dilated. The aortic sinus is normal size. There is no comparison study available.  Past Medical History:  Diagnosis Date   Atelectasis    Bradycardia    10/2015 following bariatric surgery on atenolol  --> discontinued    Chronic allergic rhinitis    Chronic venous insufficiency    superficial   Cold agglutinins present    COPD (chronic obstructive  pulmonary disease) (HCC)    Cough    Dyspnea    Edema    lower extremities   Elevated liver enzymes    d/t arava   Family history of adverse reaction to anesthesia    pts mother had difficulty with anesthesia - pt not sure what difficulties were    History of kidney stones    Hypertension    no longer on blood pressure medications since 10/25/2015 per patient    Leg pain    Liver enlargement    d/t arava   Neuromuscular disorder (HCC)    neuropathy feet   Obesity    Pallor    Pneumonia    hx of times 3; pt states is current with pneumonia vaccine   RA (rheumatoid arthritis) (HCC)     rheumatoid arthritis    Sleep apnea    no longer uses any device after 130lbs weight loss   SOBOE (shortness of breath on exertion)    Superficial thrombophlebitis    subacute-on anticoagulation   Urinary frequency    Urinary tract bacterial infections    hx of    Venous stasis    changes    Past Surgical History:  Procedure Laterality Date   HAND SURGERY  1965   right hand   I & D EXTREMITY Left 05/03/2017   Procedure: IRRIGATION AND DEBRIDEMENT EXTREMITY;  Surgeon: Kendal Franky SQUIBB, MD;  Location: [facility];  Service: Orthopedics;  Laterality: Left;   I & D KNEE WITH POLY EXCHANGE Left 11/04/2016   Procedure: LEFT KNEE POLY EXCHANGE;  Surgeon: Vernetta Lonni GRADE, MD;  Location: [facility];  Service: Orthopedics;  Laterality: Left;   KNEE SURGERY     arthroscopic right and left knee   LAMINECTOMY  1988   and fusion; L4, L5, S1   LAMINECTOMY  2007   and fusion L1, L2, L3   LAPAROSCOPIC GASTRIC SLEEVE RESECTION N/A 10/26/2015   Procedure: LAPAROSCOPIC GASTRIC SLEEVE RESECTION, UPPER ENDO;  Surgeon: Alm Angle, MD;  Location: [facility];  Service: General;  Laterality: N/A;   OTHER SURGICAL HISTORY     polynidal cyst   REPLACEMENT TOTAL KNEE  2009   left 2009; right 2014   REVERSE SHOULDER ARTHROPLASTY Right 03/23/2023   Procedure: REVERSE SHOULDER ARTHROPLASTY;  Surgeon: Dozier Soulier, MD;   Location: [facility];  Service: Orthopedics;  Laterality: Right;   SPINAL CORD STIMULATOR IMPLANT     to control back pain    TOTAL HIP ARTHROPLASTY Right 08/08/2014   Procedure: RIGHT TOTAL HIP ARTHROPLASTY ANTERIOR APPROACH;  Surgeon: Lonni GRADE Vernetta, MD;  Location: [facility];  Service: Orthopedics;  Laterality: Right;   TOTAL HIP ARTHROPLASTY Left 12/11/2015   Procedure: LEFT TOTAL HIP ARTHROPLASTY ANTERIOR APPROACH;  Surgeon: Lonni GRADE Vernetta, MD;  Location[facility];  Service: Orthopedics;  Laterality: Left;   UMBILICAL HERNIA REPAIR      MEDICATIONS:  aspirin  EC 81 MG tablet   Calcium  Carb-Cholecalciferol  (CALCIUM  600 + D PO)   cetirizine (ZYRTEC) 10 MG tablet   DULoxetine (CYMBALTA) 30  MG capsule   DULoxetine (CYMBALTA) 60 MG capsule   etanercept  (ENBREL  SURECLICK) 50 MG/ML injection   folic acid  (FOLVITE ) 800 MCG tablet   gabapentin  (NEURONTIN ) 800 MG tablet   hydroxychloroquine (PLAQUENIL) 200 MG tablet   methotrexate (RHEUMATREX) 2.5 MG tablet   Multiple Vitamins-Iron (MULTIVITAMIN/IRON PO)   rOPINIRole  (REQUIP ) 1 MG tablet   rosuvastatin (CRESTOR) 5 MG tablet   tamsulosin  (FLOMAX ) 0.4 MG CAPS capsule   traMADol  (ULTRAM ) 50 MG tablet   traZODone  (DESYREL ) 100 MG tablet   trospium (SANCTURA) 20 MG tablet   vitamin B-12 (CYANOCOBALAMIN ) 500 MCG tablet   No current facility-administered medications for this encounter.   Burnard CHRISTELLA Odis DEVONNA [facility] Phone [phone] 03/21/2024 1:40 PM        "

## 2024-03-21 NOTE — Anesthesia Preprocedure Evaluation (Signed)
"                                    Anesthesia Evaluation    Airway        Dental   Pulmonary former smoker          Cardiovascular hypertension,      Neuro/Psych    GI/Hepatic   Endo/Other    Renal/GU      Musculoskeletal   Abdominal   Peds  Hematology   Anesthesia Other Findings   Reproductive/Obstetrics                              Anesthesia Physical Anesthesia Plan  ASA:   Anesthesia Plan:    Post-op Pain Management:    Induction:   PONV Risk Score and Plan:   Airway Management Planned:   Additional Equipment:   Intra-op Plan:   Post-operative Plan:   Informed Consent:   Plan Discussed with:   Anesthesia Plan Comments: (See PAT note from 1/14)         Anesthesia Quick Evaluation  "

## 2024-03-28 ENCOUNTER — Encounter (HOSPITAL_COMMUNITY): Admission: RE | Payer: Self-pay | Source: Home / Self Care

## 2024-03-28 ENCOUNTER — Encounter (HOSPITAL_COMMUNITY): Payer: Self-pay | Admitting: Medical

## 2024-03-28 ENCOUNTER — Ambulatory Visit (HOSPITAL_COMMUNITY): Admit: 2024-03-28 | Admitting: Orthopedic Surgery

## 2024-03-28 SURGERY — ARTHROPLASTY, SHOULDER, TOTAL, REVERSE
Anesthesia: Choice | Site: Shoulder | Laterality: Left

## 2024-04-02 NOTE — Progress Notes (Signed)
 Physical Therapy Visit - Daily Note   Payor: [employer] MED[employer]ARE ADV / Plan: [employer] [place] / Product Type: Medicare Advantage /   Visit Count: 2   Rehabilitation Precautions/Restrictions:   Precautions/Restrictions Precautions: RA wrist left more than right, bilateral TKA with left revision, bilaetral THA, laminectomy, fusion L1-S1, currently unable to lift more than 5# due to pain and debility        Referring Diagnosis: M19.012 - Primary osteoarthritis of left shoulder   SUBJECTIVE Patient Report:   I have pain with any movement overhead or out to the side. My shoulder is like a 7/10 today.  Change in Status Since Last Visit: No Pain:  Pain Assessment Pain Assessment: 0-10 Pain Score  : 7 Pain Location: Shoulder Pain Orientation: Left Pain Radiating Towards: deep ache, as well as pain to elbow region  OBJECTIVE  General Observation/Objective Measures:noted right shoulder anterior placement and slight subluxation on right                 Interventions:  Therapeutic exercise: 52 mins: Focused on assessing what movements and exercises patient can tolerate including PROM, AAROM, and AROM.  Modalities: 8 mins: Patient reports significant pin point pain to axilla region and bicep region, pain noted also with palpation. Provided US  for decreased pain and inflammation while in supine. 1 mhz 50% 1.1wcm2 x 8 mins.  - Provided moist heat during intake - Performed pendulums noted increased pain with clockwise movement - scapular squeezes increased pain to right shoulder - Provided PROM shoulder flexion pain > 120 degrees, abduction pain >/= 145 degrees, ER end range discomfort - Pain with AROM for abduction  - Performed AAROM shoulder flexion with dowel - Chest press with dowel,  pain to anterior arm  - US  to anterior shoulder (pec and bicep region) - isometric shoulder extension 5 secs hold x 10 reps  - isometric shoulder abduction 5 secs hold x 10 reps  - isometric shoulder ER 5 secs  hold x 10 reps - isometric shoulder flexion 5 secs hold x 10 reps  - tricep extension initially red band only 4 reps then increased pain, attempted with yellow band and same response of pain  - AROM bilateral shoulder ER  - Attempted bilateral shoulder ER with yellow band significant pain with right shoulder, attempted with 1# dumbbell  - sidelying shoulder noted increased pain to left shoulder  - standing shoulder extension yellow band  - standing scapular retraction yellow band    Education: Yes, as described in interventions. Discussed performing exercises in positions of tolerance and avoiding movements that increase pain. Discussed also scapular alignment.    ASSESSMENT Focused on addressing movements for pain minimized range. Patient had poor tolerance for PROM, AAROM, and AROM for any elevation movement (shoulder flexion > 120, shoulder abduction and even end range ER). Patient unable to tolerate sidelying ER and reports pain with PROM and AAROM in supine. Provided US  to anterior shoulder pec and bicep region for decreased inflammation and pain noted decreased discomfort but continues to report pain with shoulder mobility activities. Patient was able to perform isometrics without pain and scapular exercises including scapular retraction and shoulder extension. Patient however did have pain with tricep resistance to left shoulder. Plan to address cervical neck pain specifically upper trap region but otherwise patient unable to tolerate shoulder ROM exercises specifically flexion and abduction and ER movements. May attempt overhead pulleys to assess painful arc and mobility.    Therapy Diagnosis:     ICD-10-CM  1. Decreased range of motion with decreased strength  M25.60, R53.1   2. Impaired mobility and ADLs  Z74.09, Z78.9   3. Chronic left shoulder pain  M25.512, G89.29   4. Aftercare following right shoulder joint replacement surgery  Z47.1, Z96.611     Progress Towards Goals:    addressing   Goals Addressed             This Visit's Progress    PT Goal       .Patient will report decrease in pain </= 4/10 with ADL tasks and mobility tasks. Target 6 visits Eval: 7/10 with movement, 4/10 at rest sharp pain anterior shoulder and in bicep)  Patient will improve shoulder flexion >/= 130 degrees, abduction > 120 degrees for improved ability to perform ADLs. Target 6 visits Eval: Left shoulder seated flexion 103 (previous 1 year ago documented 138 degrees), in supine PROM 120 noted severe end range pain Left shoulder seated abduction 90 degrees (previous 1 yr ago 175 degrees) In supine PROM 139 degrees pain to axilla region  Left shoulder ER from 90 degrees 50 degrees, left shoulder IR 52 degrees   3- Patient will improve shoulder strength >/= 4/5 to left UE for improved function. Target 6 visits Eval: Left Bicep 3-/5, shoulder flexion 3/5 limited due to pain, shoulder abduction 4/5, shoulder extensors 4+, shoulder ER 4/5, shoulder IR 4/5   4. . Patient will improve Quickdash score to <40 in order to demonstrate statistically significant improvement in patient perceived function and improvement with ADLs and functional tasks. 6 visits Eval:  Quick DASH Score 77.27             PLAN Treatment Frequency and Duration:  PT Frequency: 1x/week Treatment Plan Details: 1-2x week x 6 visits if tolerated  Recommended PT Treatment/Interventions: ADL skills (02464); Manual therapy (97140); Therapeutic activity (97530); Therapeutic exercise (97110); Ultrasound (02964); Electrical stimulation-unattended (02985)   Recommended Consults:  Orthopedics  Development of Plan of Care:  No change in POC.  Total Treatment Time (Time & Untimed): Total Treatment minutes: 60 Total Time in Timed Codes: Timed Code Minutes: 60   PT Modalities Ultrasound minutes: 8 PT Treatment/Procedure Therapeutic Exercises minutes: 52             The patient has been instructed to  contact our clinic if any questions or problems should arise.
